# Patient Record
Sex: Male | Born: 1965 | Race: White | Hispanic: No | Smoking: Former smoker
Health system: Southern US, Community
[De-identification: ages and names within clinical notes are randomized; demographics above are authoritative.]

## PROBLEM LIST (undated history)

## (undated) DIAGNOSIS — R519 Headache, unspecified: Secondary | ICD-10-CM

## (undated) DIAGNOSIS — M1990 Unspecified osteoarthritis, unspecified site: Secondary | ICD-10-CM

## (undated) DIAGNOSIS — R51 Headache: Secondary | ICD-10-CM

## (undated) DIAGNOSIS — K589 Irritable bowel syndrome without diarrhea: Secondary | ICD-10-CM

## (undated) DIAGNOSIS — K219 Gastro-esophageal reflux disease without esophagitis: Secondary | ICD-10-CM

## (undated) HISTORY — PX: NO PAST SURGERIES: SHX2092

---

## 2000-11-14 ENCOUNTER — Encounter: Payer: Self-pay | Admitting: Emergency Medicine

## 2000-11-14 ENCOUNTER — Emergency Department (HOSPITAL_COMMUNITY): Admission: EM | Admit: 2000-11-14 | Discharge: 2000-11-14 | Payer: Self-pay | Admitting: Emergency Medicine

## 2001-03-24 ENCOUNTER — Encounter: Payer: Self-pay | Admitting: Emergency Medicine

## 2001-03-24 ENCOUNTER — Emergency Department (HOSPITAL_COMMUNITY): Admission: EM | Admit: 2001-03-24 | Discharge: 2001-03-24 | Payer: Self-pay

## 2002-12-04 ENCOUNTER — Emergency Department (HOSPITAL_COMMUNITY): Admission: EM | Admit: 2002-12-04 | Discharge: 2002-12-04 | Payer: Self-pay | Admitting: Emergency Medicine

## 2003-05-23 ENCOUNTER — Emergency Department (HOSPITAL_COMMUNITY): Admission: EM | Admit: 2003-05-23 | Discharge: 2003-05-23 | Payer: Self-pay | Admitting: Emergency Medicine

## 2004-06-17 ENCOUNTER — Inpatient Hospital Stay (HOSPITAL_COMMUNITY): Admission: EM | Admit: 2004-06-17 | Discharge: 2004-06-20 | Payer: Self-pay | Admitting: Emergency Medicine

## 2004-06-19 ENCOUNTER — Ambulatory Visit: Payer: Self-pay | Admitting: Orthopedic Surgery

## 2004-06-21 ENCOUNTER — Encounter (HOSPITAL_COMMUNITY): Admission: RE | Admit: 2004-06-21 | Discharge: 2004-07-21 | Payer: Self-pay | Admitting: Oncology

## 2004-06-22 ENCOUNTER — Ambulatory Visit (HOSPITAL_COMMUNITY): Payer: Self-pay | Admitting: Internal Medicine

## 2004-06-27 ENCOUNTER — Ambulatory Visit (HOSPITAL_COMMUNITY): Payer: Self-pay | Admitting: Internal Medicine

## 2004-07-01 ENCOUNTER — Ambulatory Visit (HOSPITAL_COMMUNITY): Payer: Self-pay | Admitting: Internal Medicine

## 2007-08-18 ENCOUNTER — Encounter: Admission: RE | Admit: 2007-08-18 | Discharge: 2007-08-18 | Payer: Self-pay | Admitting: Family Medicine

## 2010-06-09 NOTE — Group Therapy Note (Signed)
Reginald [name], Reginald [name]                  ACCOUNT NO.:  [id number]   MEDICAL RECORD NO.:  [id number]          PATIENT TYPE:  INP   LOCATION:  A338                          FACILITY:  [facility]   PHYSICIAN:  Vickki Hearing, M.D.DATE OF BIRTH:  [birth year]   DATE OF PROCEDURE:  06/19/2004  DATE OF DISCHARGE:                                   PROGRESS NOTE   DIAGNOSIS:  Carbuncles of right forearm.   The patient remained afebrile.  White count is down from 10 to 9.  There is  no left shift.  The arm continues to improve.  Continue antibiotics.      SEH/MEDQ  D:  06/19/2004  T:  06/19/2004  Job:  [id number]

## 2010-06-09 NOTE — Discharge Summary (Signed)
NAMECARLYN, Reginald [name]                  ACCOUNT NO.:  [id number]   MEDICAL RECORD NO.:  [id number]          PATIENT TYPE:  INP   LOCATION:  A338                          FACILITY:  [facility]   PHYSICIAN:  Vania Rea, M.D. DATE OF BIRTH:  [birth year]   DATE OF ADMISSION:  06/17/2004  DATE OF DISCHARGE:  05/30/2006LH                                 DISCHARGE SUMMARY   PRIMARY CARE PHYSICIAN:  Dr. Christell Constant at [facility].   CONSULTS THIS ADMISSION:  Dr. Fuller Canada.   DISCHARGE DIAGNOSIS:  Multiple abscesses right forearm probable methicillin-  resistant Staphylococcus aureus.   DISPOSITION:  Discharged to home.   DISCHARGE CONDITION:  Stable.   DISCHARGE MEDICATIONS:  1. Vancomycin 2000 mg IV daily for two weeks.  2. Vicodin one-two tablets when necessary for pain, 10 tablets prescribed.  3. Bactroban ointment apply twice daily to sores.     HOSPITAL COURSE:  Please refer to the admission history and physical.  This  is a 45 year old Caucasian man who works in a [employer] and  who injured his right arm playing softball.  Subsequently, he developed  multiple inflammatory swellings of the right forearm and extensive swelling  of the elbow with multiple abscesses.  The patient had failed to respond to  Bactrim and doxycycline as an outpatient and was noted to be allergic to  penicillin.  He was started on intravenous vancomycin in hospital and had  the benefit of an orthopedic consult.  The swelling of his elbow has  decreased considerably.  There was no evidence of arthritic involvement and  the ulcers are much improved.  He had a white count of 15,000 on the day of  admission.  It is now considered resolved.  The patient is feeling  comfortable and he is considered fit enough to be continued on antibiotics  as an outpatient.  Cultures from one of the abscesses grew nil.   PHYSICAL EXAMINATION:  VITAL SIGNS:  This morning temperature is 98,  pulse  76, respirations 20, blood pressure 123/74.  CHEST:  Clear to auscultation.  CARDIOVASCULAR:  Regular rhythm without murmurs.  ABDOMEN:  Soft and nontender.  EXTREMITIES:  His right forearm has three indurated areas with superficial  eschars.  All areas are inflamed but no evidence of abscess formation  currently.   LABORATORIES:  His white count has decreased to 8.9, his hemoglobin is  stable at 13.9, hematocrit 40, RDW 12.8, and platelets 219.  Absolute  neutrophil count is 5.1.  His sodium is 141, potassium 4.0, chloride 107,  CO2 of 27, glucose 102, BUN 7, creatinine 0.9.  His liver function tests on  admission were remarkable only for a slightly elevated AST of 42.  His  calcium is 8.6.  His urinalysis was unremarkable.  X-rays of the forearm  revealed no evidence of bone involvement.  Blood cultures and wound cultures  showed no evidence of infection.   FOLLOW UP:  With [facility] within one week.  He is to come to the  specialty clinic daily for infusion of vancomycin.  He is to be reevaluated  for return to work in two weeks.      LC/MEDQ  D:  06/20/2004  T:  06/20/2004  Job:  [id number]

## 2010-06-09 NOTE — Consult Note (Signed)
NAMEEVANGELOS, PAULINO                  ACCOUNT NO.:  [id number]   MEDICAL RECORD NO.:  [id number]          PATIENT TYPE:  INP   LOCATION:  A338                          FACILITY:  [facility]   PHYSICIAN:  Vickki Hearing, M.D.DATE OF BIRTH:  07-14-1965   DATE OF CONSULTATION:  06/18/2004  DATE OF DISCHARGE:                                   CONSULTATION   Initial consultation request was for Dr. Hilda Lias.  He is out of town, and I  am covering for him.   REQUESTING PHYSICIAN:  Calvert Cantor, M.D.   CHIEF COMPLAINT:  Abscess right forearm.   HISTORY:  A complete history and physical has been dictated by Dr. Butler Denmark.  It is incorporated by reference. I will do a summation.   Basically this is a 45 year old male who was playing softball last Monday.  Had some type of mild injury to the forearm, woke up the next morning with 3  areas of swollen, reddened, carbuncle-like lesions on his right forearm.  He  does not remember any insect bit.  He went to the [facility] and they started him on p.o. antibiotics.  He did not  improve.  By Saturday he was nauseous, he was vomiting, he had weakness.  His one area that had particularly swollen significantly.  This are was on  the volar aspect of the forearm, and he was also having some elbow swelling  with pain on flexion-extension.  There was also an axillary palpable lymph  node.   Dr. Butler Denmark admitted the patient and started him on vancomycin and moist heat  and when I saw him this morning his swelling had gone done in the elbow.  The primary large lesion had also decreased in size.  He responded well to  the vancomycin.   PHYSICAL EXAMINATION:  He was awake, alert, and oriented x3.  His mood and  affect were normal.  There were no sensory deficits in the right upper  extremity.  His other extremities were well __________ with no contractures,  subluxation, atrophy or tremor.   He had excellent elbow motion.  No  palpable lymph nodes.  The primary large  area was indurated, there was no expressible fluid.   The other areas has redness around them.  They were tender, as well, and  there was some purulent material, but a small amount leaking from a scabbed  over area.  There were no alignment problems.  Range of motion of the wrists  and shoulder were normal.  Muscle strength and muscle tone were normal.  Alignment, again was normal and all joints were stable.   His lab results showed a white count of 9.4.  Other history, again, taken  from the medical record incorporated by reference.   IMPRESSION:  Carbuncle/abscess right forearm, improving on vancomycin an  heating pad.  The cause of the lesions are unknown.   RECOMMEND:  Continue heating pad and vancomycin.  If the area becomes more  fluctuant a local I&D can be done to speed the healing process.  If not, it  will resolve eventually without surgical treatment.      SEH/MEDQ  D:  06/18/2004  T:  06/18/2004  Job:  [id number]

## 2010-06-09 NOTE — H&P (Signed)
Reginald [name], Reginald [name]                  ACCOUNT NO.:  [id number]   MEDICAL RECORD NO.:  [id number]          PATIENT TYPE:  EMS   LOCATION:  ED                            FACILITY:  [facility]   PHYSICIAN:  Calvert Cantor, M.D.     DATE OF BIRTH:  1965-01-31   DATE OF ADMISSION:  06/17/2004  DATE OF DISCHARGE:  LH                                HISTORY & PHYSICAL   Patient of [facility].   PRESENTING COMPLAINT:  Abscesses on the right arm.   HISTORY OF PRESENT ILLNESS:  This is a 45 year old white male who injured  his arm while playing softball.  On the following day, he noticed swelling  of his right arm, which further developed into small bumps and later  carbuncles.  He has been followed up by [facility]  and was started on Bactrim and doxycycline; however, the swelling has  continued to worsen.  Therefore, he was sent to the ER today.  The patient  does not complain of any fevers or chills; however, en route to the hospital  today, he noticed some sweats.  He states that he is having pain in the  abscesses, in addition to pain in his right elbow, which has swollen up as  well.  There has been some drainage out of the largest abscess; however, it  is not draining currently.  He has not noticed any rash in any other part of  the body nor any other joint pain.   PAST MEDICAL HISTORY:  None.   PAST SURGICAL HISTORY:  None.   ALLERGIES:  He is allergic to PENICILLIN, which causes nausea, vomiting, and  a rash on his hands.   SOCIAL HISTORY:  He is a smoker, smoking 1-1/2 packs per day since he was 45  years old.  He does not drink alcohol.  He is unmarried.  He has two  children, who are ages 9 and 27, who are alive and healthy.   FAMILY HISTORY:  His father is alive with a history of CAD, status post  stent placement, and hypertension.  His mother is also alive with  hypertension.  He currently lives with his parents.   MEDICATIONS:   Bactrim DS 1 tab b.i.d.  Vermox 100 mg daily.  Patient was  initially given Keflex; however, this was ineffective, and Bactrim was  added.  Yesterday, he was given IV Rocephin in the office.  In addition, he  has been on Ultram and codeine for pain.   PHYSICAL EXAMINATION:  VITAL SIGNS:  Temperature 98.2 degrees, blood  pressure 127/75, pulse 94, respiratory rate 18, pulse ox 98% on room air.  MUSCULOSKELETAL:  Right arm is showing a large abscess, about 2 x 3 inches  on the forearm with a central necrotic area, which is scabbed.  There is no  drainage.  There is severe induration, erythema that extends about 2 inches  beyond the abscess, warmth and tenderness.  He has a smaller abscess below  this large on his forearm, and he has another one on the  back of his  forearm, all of which appeared to have drained, however are still enlarged  and tender with a central necrotic area.  His right elbow is swollen and  tender.  Painful when flexed and extended.  He has enlarged axillary lymph  nodes on the right side as well.  HEART:  Regular rate and rhythm.  LUNGS:  Clear bilaterally.  ABDOMEN:  Soft, nontender, nondistended.  Bowel sounds positive.  EXTREMITIES:  No clubbing, cyanosis or edema in the lower extremities.   REVIEW OF SYSTEMS:  Positive for nausea and vomiting.  Negative for fevers  or chills.  Positive for some sweats.  Negative for abdominal pain,  diarrhea, and dysuria.  Negative for shortness of breath and cough.   BLOOD WORK:  White count is 10.5, hemoglobin 14, hematocrit 40.4, platelets  203.  Sodium 135, potassium 3.9, chloride 104, bicarb 26, glucose 119.  BUN  4, creatinine 0.9.  Total bilirubin 0.4.  Alkaline phosphatase 80.  AST 42,  ALT 17, total protein 6.1, albumin 3.6, calcium 8.1.  UA is normal.   X-ray of the right forearm is normal.   ASSESSMENT/PLAN:  This is a 45 year old with multiple abscesses which have  been progressing, despite being on Ancef, Bactrim  DS, and receiving a dose  of Rocephin and Vermox.  The patient is going to be admitted and started on  vancomycin IV.  Blood cultures will be drawn.  Surgery consults will be  placed for incision and drainage and possible aspiration of the elbow.  Actually, the swelling around the elbow appears to be more in his muscles  rather than in the joint itself.  He will receive Tylenol for fever, Vicodin  for pain, Reglan for nausea and vomiting.       SR/MEDQ  D:  06/17/2004  T:  06/17/2004  Job:  [id number]   cc:   [facility]

## 2015-04-29 ENCOUNTER — Encounter: Payer: Self-pay | Admitting: Family

## 2015-04-29 ENCOUNTER — Emergency Department (HOSPITAL_COMMUNITY)
Admission: EM | Admit: 2015-04-29 | Discharge: 2015-04-29 | Disposition: A | Discharge status: Home / Self Care | Payer: 59 | Attending: Emergency Medicine | Admitting: Emergency Medicine

## 2015-04-29 ENCOUNTER — Ambulatory Visit (INDEPENDENT_AMBULATORY_CARE_PROVIDER_SITE_OTHER): Payer: 59 | Admitting: Family

## 2015-04-29 ENCOUNTER — Encounter (HOSPITAL_COMMUNITY): Payer: Self-pay | Admitting: Emergency Medicine

## 2015-04-29 ENCOUNTER — Encounter (INDEPENDENT_AMBULATORY_CARE_PROVIDER_SITE_OTHER): Payer: Self-pay

## 2015-04-29 VITALS — BP 147/90 | HR 74 | Temp 97.0°F | Ht 68.0 in | Wt 129.6 lb

## 2015-04-29 DIAGNOSIS — F17210 Nicotine dependence, cigarettes, uncomplicated: Secondary | ICD-10-CM | POA: Diagnosis not present

## 2015-04-29 DIAGNOSIS — K460 Unspecified abdominal hernia with obstruction, without gangrene: Secondary | ICD-10-CM | POA: Diagnosis not present

## 2015-04-29 DIAGNOSIS — K4090 Unilateral inguinal hernia, without obstruction or gangrene, not specified as recurrent: Secondary | ICD-10-CM

## 2015-04-29 MED ORDER — HYDROCODONE-ACETAMINOPHEN 5-325 MG PO TABS: 1.0000 | ORAL_TABLET | Freq: Four times a day (QID) | ORAL | Status: DC | PRN

## 2015-04-29 MED ORDER — ONDANSETRON HCL 4 MG/2ML IJ SOLN
4.0000 mg | Freq: Once | INTRAMUSCULAR | Status: AC
  Administered 2015-04-29: 4 mg via INTRAVENOUS
  Filled 2015-04-29: qty 2

## 2015-04-29 MED ORDER — HYDROMORPHONE HCL 1 MG/ML IJ SOLN
0.5000 mg | Freq: Once | INTRAMUSCULAR | Status: AC
  Administered 2015-04-29: 0.5 mg via INTRAVENOUS
  Filled 2015-04-29: qty 1

## 2015-04-29 MED ORDER — ONDANSETRON 4 MG PO TBDP: ORAL_TABLET | ORAL | Status: DC

## 2015-04-29 NOTE — ED Provider Notes (Signed)
CSN: [id number]     Arrival date & time 04/29/15  1532 History   First MD Initiated Contact with Patient 04/29/15 1600     Chief Complaint  Patient presents with  . Inguinal Hernia     (Consider location/radiation/quality/duration/timing/severity/associated sxs/prior Treatment) Patient is a 50 y.o. male presenting with abdominal pain. The history is provided by the patient (Patient complains of right inguinal abdominal pain).  Abdominal Pain Pain location: Right inguinal area. Pain quality: aching   Pain radiates to:  Does not radiate Pain severity:  Moderate Onset quality:  Sudden Timing:  Intermittent Progression:  Waxing and waning Chronicity:  New Context: not alcohol use   Associated symptoms: no chest pain, no cough, no diarrhea, no fatigue and no hematuria     History reviewed. No pertinent past medical history. History reviewed. No pertinent past surgical history. Family History  Problem Relation Age of Onset  . Diabetes Mother   . Osteoarthritis Mother   . Heart disease Father    Social History  Substance Use Topics  . Smoking status: Heavy Tobacco Smoker -- 1.50 packs/day    Types: Cigarettes  . Smokeless tobacco: None  . Alcohol Use: No    Review of Systems  Constitutional: Negative for appetite change and fatigue.  HENT: Negative for congestion, ear discharge and sinus pressure.   Eyes: Negative for discharge.  Respiratory: Negative for cough.   Cardiovascular: Negative for chest pain.  Gastrointestinal: Positive for abdominal pain. Negative for diarrhea.  Genitourinary: Negative for frequency and hematuria.  Musculoskeletal: Negative for back pain.  Skin: Negative for rash.  Neurological: Negative for seizures and headaches.  Psychiatric/Behavioral: Negative for hallucinations.      Allergies  Penicillins  Home Medications   Prior to Admission medications   Medication Sig Start Date End Date Taking? Authorizing Provider   HYDROcodone-acetaminophen (NORCO/VICODIN) 5-325 MG tablet Take 1 tablet by mouth every 6 (six) hours as needed. 04/29/15   Milton Ferguson, MD  ondansetron (ZOFRAN ODT) 4 MG disintegrating tablet 4mg  ODT q4 hours prn nausea/vomit 04/29/15   Milton Ferguson, MD   BP 151/84 mmHg  Pulse 82  Temp(Src) 98.2 F (36.8 C) (Oral)  Resp 17  Ht 5\' 8"  (1.727 m)  Wt 146 lb (66.225 kg)  BMI 22.20 kg/m2  SpO2 99% Physical Exam  Constitutional: He is oriented to person, place, and time. He appears well-developed.  HENT:  Head: Normocephalic.  Eyes: Conjunctivae and EOM are normal. No scleral icterus.  Neck: Neck supple. No thyromegaly present.  Cardiovascular: Normal rate and regular rhythm.  Exam reveals no gallop and no friction rub.   No murmur heard. Pulmonary/Chest: No stridor. He has no wheezes. He has no rales. He exhibits no tenderness.  Abdominal: He exhibits no distension. There is tenderness. There is no rebound.  Tenderness and swelling to right inguinal area. Patient has a right inguinal hernia that is reducible  Musculoskeletal: Normal range of motion. He exhibits no edema.  Lymphadenopathy:    He has no cervical adenopathy.  Neurological: He is oriented to person, place, and time. He exhibits normal muscle tone. Coordination normal.  Skin: No rash noted. No erythema.  Psychiatric: He has a normal mood and affect. His behavior is normal.    ED Course  Procedures (including critical care time) Labs Review Labs Reviewed - No data to display  Imaging Review No results found. I have personally reviewed and evaluated these images and lab results as part of my medical decision-making.   EKG  Interpretation None      MDM   Final diagnoses:  Hernia with obstruction    Patient with a reducible right inguinal hernia. Patient given Vicodin and Zofran. He is referred to general surgery. Patient given a note for work not to do any lifting or operating machinery    Milton Ferguson,  MD 04/29/15 [phone]

## 2015-04-29 NOTE — Progress Notes (Signed)
   Subjective:    Patient ID: Reginald [name], male    DOB: 23-May-1965, 50 y.o.   MRN: [id number]  HPI PT presents to the office today to establish care. Pt states he has a "knot" in her right groin that he noticed it two weeks ago. Pt states he was driving and started coughing and noticed the painful knot. Pt states he "push the knot in ", but if he bends or moves it comes back out. Pt states he is having constant 10 out of 10. Pt states it hurts to walk. Pt state she has taken aspirin with no relief.    Review of Systems  Constitutional: Negative.   HENT: Negative.   Respiratory: Negative.   Cardiovascular: Negative.   Gastrointestinal: Negative.   Endocrine: Negative.   Genitourinary: Negative.   Musculoskeletal: Negative.   Neurological: Negative.   Hematological: Negative.   Psychiatric/Behavioral: Negative.   All other systems reviewed and are negative.      Objective:   Physical Exam  Constitutional: He is oriented to person, place, and time. He appears well-developed and well-nourished. No distress.  HENT:  Head: Normocephalic.  Eyes: Pupils are equal, round, and reactive to light. Right eye exhibits no discharge. Left eye exhibits no discharge.  Neck: Normal range of motion. Neck supple. No thyromegaly present.  Cardiovascular: Normal rate, regular rhythm, normal heart sounds and intact distal pulses.   No murmur heard. Pulmonary/Chest: Effort normal and breath sounds normal. No respiratory distress. He has no wheezes.  Abdominal: Soft. Bowel sounds are normal. He exhibits no distension. There is tenderness.  inguinal hernia present, soft to touch, but unable to push without causing a great deal of pain to patient.   Musculoskeletal: Normal range of motion. He exhibits no edema or tenderness.  Neurological: He is alert and oriented to person, place, and time. No cranial nerve deficit.  Skin: Skin is warm and dry. No rash noted. No erythema.  Psychiatric: He has a normal  mood and affect. His behavior is normal. Judgment and thought content normal.  Vitals reviewed.    BP 147/90 mmHg  Pulse 74  Temp(Src) 97 F (36.1 C) (Oral)  Ht 5\' 8"  (1.727 m)  Wt 129 lb 9.6 oz (58.786 kg)  BMI 19.71 kg/m2      Assessment & Plan:  1. Unilateral inguinal hernia without obstruction or gangrene, recurrence not specified _Pt told to go to ED because of the pain -Avoid heavy lifting or coughing -RTO prn   Evelina Dun, FNP

## 2015-04-29 NOTE — Discharge Instructions (Signed)
Follow-up with [facility] surgery next week

## 2015-04-29 NOTE — ED Notes (Signed)
PT sent from [facility] for dx of inguinal hernia evaluation. PT also stated he noticed blood in his urine first thing this morning but none noted this evening. PT also c/o nausea but no diarrhea or vomiting with normal BM this am.

## 2015-04-29 NOTE — Patient Instructions (Signed)
Hernia, Adult A hernia is the bulging of an organ or tissue through a weak spot in the muscles of the abdomen (abdominal wall). Hernias develop most often near the navel or groin. There are many kinds of hernias. Common kinds include:  Femoral hernia. This kind of hernia develops under the groin in the upper thigh area.  Inguinal hernia. This kind of hernia develops in the groin or scrotum.  Umbilical hernia. This kind of hernia develops near the navel.  Hiatal hernia. This kind of hernia causes part of the stomach to be pushed up into the chest.  Incisional hernia. This kind of hernia bulges through a scar from an abdominal surgery. CAUSES This condition may be caused by:  Heavy lifting.  Coughing over a long period of time.  Straining to have a bowel movement.  An incision made during an abdominal surgery.  A birth defect (congenital defect).  Excess weight or obesity.  Smoking.  Poor nutrition.  Cystic fibrosis.  Excess fluid in the abdomen.  Undescended testicles. SYMPTOMS Symptoms of a hernia include:  A lump on the abdomen. This is the first sign of a hernia. The lump may become more obvious with standing, straining, or coughing. It may get bigger over time if it is not treated or if the condition causing it is not treated.  Pain. A hernia is usually painless, but it may become painful over time if treatment is delayed. The pain is usually dull and may get worse with standing or lifting heavy objects. Sometimes a hernia gets tightly squeezed in the weak spot (strangulated) or stuck there (incarcerated) and causes additional symptoms. These symptoms may include:  Vomiting.  Nausea.  Constipation.  Irritability. DIAGNOSIS A hernia may be diagnosed with:  A physical exam. During the exam your health care provider may ask you to cough or to make a specific movement, because a hernia is usually more visible when you move.  Imaging tests. These can  include:  X-rays.  Ultrasound.  CT scan. TREATMENT A hernia that is small and painless may not need to be treated. A hernia that is large or painful may be treated with surgery. Inguinal hernias may be treated with surgery to prevent incarceration or strangulation. Strangulated hernias are always treated with surgery, because lack of blood to the trapped organ or tissue can cause it to die. Surgery to treat a hernia involves pushing the bulge back into place and repairing the weak part of the abdomen. HOME CARE INSTRUCTIONS  Avoid straining.  Do not lift anything heavier than 10 lb (4.5 kg).  Lift with your leg muscles, not your back muscles. This helps avoid strain.  When coughing, try to cough gently.  Prevent constipation. Constipation leads to straining with bowel movements, which can make a hernia worse or cause a hernia repair to break down. You can prevent constipation by:  Eating a high-fiber diet that includes plenty of fruits and vegetables.  Drinking enough fluids to keep your urine clear or pale yellow. Aim to drink 6-8 glasses of water per day.  Using a stool softener as directed by your health care provider.  Lose weight, if you are overweight.  Do not use any tobacco products, including cigarettes, chewing tobacco, or electronic cigarettes. If you need help quitting, ask your health care provider.  Keep all follow-up visits as directed by your health care provider. This is important. Your health care provider may need to monitor your condition. SEEK MEDICAL CARE IF:  You have   swelling, redness, and pain in the affected area.  Your bowel habits change. SEEK IMMEDIATE MEDICAL CARE IF:  You have a fever.  You have abdominal pain that is getting worse.  You feel nauseous or you vomit.  You cannot push the hernia back in place by gently pressing on it while you are lying down.  The hernia:  Changes in shape or size.  Is stuck outside the  abdomen.  Becomes discolored.  Feels hard or tender.   This information is not intended to replace advice given to you by your health care provider. Make sure you discuss any questions you have with your health care provider.   Document Released: 01/08/2005 Document Revised: 01/29/2014 Document Reviewed: 11/18/2013 Elsevier Interactive Patient Education 2016 [employer]  

## 2015-05-16 ENCOUNTER — Ambulatory Visit: Payer: Self-pay | Admitting: Surgery

## 2015-05-16 NOTE — H&P (Signed)
History of Present Illness Reginald [name]. Reginald Dudgeon MD; 05/16/2015 10:04 AM) The patient is a 50 year old male who presents with an inguinal hernia. PCP - [facility] - Dr. Laurance Flatten  Reason: right inguinal hernia  This is a 50 year old male with a past medical history significant only for heavy tobacco abuse who presents with a three-week history of right groin swelling and pain. The patient had a severe coughing episode and felt a burning tearing sensation in his right groin. Subsequently he developed a bulge in this area. It remains reducible. He was actually evaluated in the emergency department but was found to have a reducible hernia. He is now referred to discuss surgical repair.  The patient smokes 1.5 packs per day which is down from 3 packs per day. He does have a chronic cough.   Other Problems Elbert Ewings, CMA; 05/16/2015 9:00 AM) Arthritis Back Pain Inguinal Hernia Migraine Headache  Past Surgical History Elbert Ewings, CMA; 05/16/2015 9:00 AM) No pertinent past surgical history  Diagnostic Studies History Elbert Ewings, CMA; 05/16/2015 9:00 AM) Colonoscopy never  Allergies Elbert Ewings, CMA; 05/16/2015 9:01 AM) Penicillin V Potassium *PENICILLINS* Rash.  Medication History Elbert Ewings, CMA; 05/16/2015 9:01 AM) Hydrocodone-Acetaminophen (5-325MG  Tablet, Oral) Active. Zofran (4MG  Tablet, Oral) Active. Medications Reconciled  Social History Elbert Ewings, CMA; 05/16/2015 9:00 AM) Alcohol use Remotely quit alcohol use. Caffeine use Carbonated beverages, Coffee. Tobacco use Current every day smoker.  Family History Elbert Ewings, [place]; 05/16/2015 9:00 AM) Migraine Headache Father. Respiratory Condition Father.     Review of Systems Elbert Ewings CMA; 05/16/2015 9:00 AM) General Present- Appetite Loss and Night Sweats. Not Present- Chills, Fatigue, Fever, Weight Gain and Weight Loss. Skin Not Present- Change in Wart/Mole, Dryness, Hives,  Jaundice, New Lesions, Non-Healing Wounds, Rash and Ulcer. HEENT Present- Ringing in the Ears. Not Present- Earache, Hearing Loss, Hoarseness, Nose Bleed, Oral Ulcers, Seasonal Allergies, Sinus Pain, Sore Throat, Visual Disturbances, Wears glasses/contact lenses and Yellow Eyes. Respiratory Present- Chronic Cough. Not Present- Bloody sputum, Difficulty Breathing, Snoring and Wheezing. Breast Not Present- Breast Mass, Breast Pain, Nipple Discharge and Skin Changes. Cardiovascular Not Present- Chest Pain, Difficulty Breathing Lying Down, Leg Cramps, Palpitations, Rapid Heart Rate, Shortness of Breath and Swelling of Extremities. Gastrointestinal Present- Abdominal Pain, Bloating, Change in Bowel Habits, Excessive gas, Gets full quickly at meals, Nausea and Vomiting. Not Present- Bloody Stool, Chronic diarrhea, Constipation, Difficulty Swallowing, Hemorrhoids, Indigestion and Rectal Pain. Male Genitourinary Present- Blood in Urine, Change in Urinary Stream, Frequency, Nocturia and Urgency. Not Present- Impotence, Painful Urination and Urine Leakage.  Vitals Elbert Ewings CMA; 05/16/2015 9:01 AM) 05/16/2015 9:01 AM Weight: 146 lb Height: 68in Body Surface Area: 1.79 m Body Mass Index: 22.2 kg/m  Temp.: 97.61F  Pulse: 80 (Regular)  BP: 132/84 (Sitting, Left Arm, Standard)      Physical Exam Rodman Key K. Quron Ruddy MD; 05/16/2015 10:06 AM)  The physical exam findings are as follows: Note:WDWN in NAD - smells heavily of tobacco smoke HEENT: EOMI, sclera anicteric Neck: No masses, no thyromegaly Lungs: bilateral rhonchi; normal respiratory effort CV: Regular rate and rhythm; no murmurs Abd: +bowel sounds, soft, non-tender, no masses GU: bilateral descended testes; no testicular masses; visible reducible right inguinal hernia; no sign of left inguinal hernia Ext: Well-perfused; no edema Skin: Warm, dry; no sign of jaundice    Assessment & Plan Rodman Key K. Arlie Posch MD; 05/16/2015 9:32  AM)  RIGHT INGUINAL HERNIA (K40.90)  Current Plans Schedule for Surgery - Right inguinal hernia repair with  mesh. The surgical procedure has been discussed with the patient. Potential risks, benefits, alternative treatments, and expected outcomes have been explained. All of the patient's questions at this time have been answered. The likelihood of reaching the patient's treatment goal is good. The patient understand the proposed surgical procedure and wishes to proceed.   Reginald [name]. Georgette Dover, MD, [facility]  General/ Trauma Surgery  05/16/2015 10:06 AM

## 2015-05-31 ENCOUNTER — Ambulatory Visit: Payer: Self-pay | Admitting: General Surgery

## 2015-06-01 ENCOUNTER — Encounter (HOSPITAL_COMMUNITY): Payer: Self-pay | Admitting: *Deleted

## 2015-06-01 NOTE — Op Note (Signed)
Pt denies cardiac history, chest pain or sob. 

## 2015-06-02 ENCOUNTER — Encounter (HOSPITAL_COMMUNITY): Payer: Self-pay | Admitting: Anesthesiology

## 2015-06-02 ENCOUNTER — Encounter (HOSPITAL_COMMUNITY)
Admission: RE | Disposition: A | Discharge status: Home / Self Care | Payer: Self-pay | Source: Ambulatory Visit | Attending: General Surgery

## 2015-06-02 ENCOUNTER — Ambulatory Visit (HOSPITAL_COMMUNITY): Payer: 59 | Admitting: Anesthesiology

## 2015-06-02 ENCOUNTER — Ambulatory Visit (HOSPITAL_COMMUNITY)
Admission: RE | Admit: 2015-06-02 | Discharge: 2015-06-02 | Disposition: A | Discharge status: Home / Self Care | Payer: 59 | Source: Ambulatory Visit | Attending: General Surgery | Admitting: General Surgery

## 2015-06-02 DIAGNOSIS — M1990 Unspecified osteoarthritis, unspecified site: Secondary | ICD-10-CM | POA: Insufficient documentation

## 2015-06-02 DIAGNOSIS — F17210 Nicotine dependence, cigarettes, uncomplicated: Secondary | ICD-10-CM | POA: Insufficient documentation

## 2015-06-02 DIAGNOSIS — K4090 Unilateral inguinal hernia, without obstruction or gangrene, not specified as recurrent: Secondary | ICD-10-CM | POA: Diagnosis not present

## 2015-06-02 HISTORY — DX: Headache: R51

## 2015-06-02 HISTORY — PX: INSERTION OF MESH: SHX5868

## 2015-06-02 HISTORY — DX: Headache, unspecified: R51.9

## 2015-06-02 HISTORY — DX: Unspecified osteoarthritis, unspecified site: M19.90

## 2015-06-02 HISTORY — PX: INGUINAL HERNIA REPAIR: SHX194

## 2015-06-02 LAB — CBC
HEMATOCRIT: 47.2 % (ref 39.0–52.0)
Hemoglobin: 15.8 g/dL (ref 13.0–17.0)
MCH: 31.1 pg (ref 26.0–34.0)
MCHC: 33.5 g/dL (ref 30.0–36.0)
MCV: 92.9 fL (ref 78.0–100.0)
Platelets: 230 10*3/uL (ref 150–400)
RBC: 5.08 MIL/uL (ref 4.22–5.81)
RDW: 12.8 % (ref 11.5–15.5)
WBC: 10.8 10*3/uL — ABNORMAL HIGH (ref 4.0–10.5)

## 2015-06-02 SURGERY — REPAIR, HERNIA, INGUINAL, ADULT
Anesthesia: Regional | Site: Inguinal | Laterality: Right

## 2015-06-02 MED ORDER — MORPHINE SULFATE (PF) 2 MG/ML IV SOLN: 1.0000 mg | INTRAVENOUS | Status: DC | PRN

## 2015-06-02 MED ORDER — FENTANYL CITRATE (PF) 250 MCG/5ML IJ SOLN
INTRAMUSCULAR | Status: AC
  Filled 2015-06-02: qty 5

## 2015-06-02 MED ORDER — MIDAZOLAM HCL 2 MG/2ML IJ SOLN
INTRAMUSCULAR | Status: AC
  Filled 2015-06-02: qty 2

## 2015-06-02 MED ORDER — ONDANSETRON HCL 4 MG/2ML IJ SOLN
INTRAMUSCULAR | Status: DC | PRN
  Administered 2015-06-02: 4 mg via INTRAVENOUS

## 2015-06-02 MED ORDER — GLYCOPYRROLATE 0.2 MG/ML IJ SOLN
INTRAMUSCULAR | Status: DC | PRN
  Administered 2015-06-02: 0.4 mg via INTRAVENOUS

## 2015-06-02 MED ORDER — OXYCODONE HCL 5 MG PO TABS: 5.0000 mg | ORAL_TABLET | ORAL | Status: DC | PRN

## 2015-06-02 MED ORDER — CHLORHEXIDINE GLUCONATE 4 % EX LIQD: 1.0000 "application " | Freq: Once | CUTANEOUS | Status: DC

## 2015-06-02 MED ORDER — SODIUM CHLORIDE 0.9 % IV SOLN: INTRAVENOUS | Status: DC

## 2015-06-02 MED ORDER — CHLORHEXIDINE GLUCONATE 4 % EX LIQD: 1.0000 | Freq: Once | CUTANEOUS | Status: DC

## 2015-06-02 MED ORDER — 0.9 % SODIUM CHLORIDE (POUR BTL) OPTIME
TOPICAL | Status: DC | PRN
  Administered 2015-06-02: 1000 mL

## 2015-06-02 MED ORDER — CEFAZOLIN SODIUM-DEXTROSE 2-4 GM/100ML-% IV SOLN
INTRAVENOUS | Status: AC
  Filled 2015-06-02: qty 100

## 2015-06-02 MED ORDER — SODIUM CHLORIDE 0.9 % IV SOLN: 250.0000 mL | INTRAVENOUS | Status: DC | PRN

## 2015-06-02 MED ORDER — BUPIVACAINE-EPINEPHRINE (PF) 0.25% -1:200000 IJ SOLN
INTRAMUSCULAR | Status: AC
  Filled 2015-06-02: qty 30

## 2015-06-02 MED ORDER — NEOSTIGMINE METHYLSULFATE 10 MG/10ML IV SOLN
INTRAVENOUS | Status: DC | PRN
  Administered 2015-06-02: 3 mg via INTRAVENOUS

## 2015-06-02 MED ORDER — BUPIVACAINE-EPINEPHRINE (PF) 0.5% -1:200000 IJ SOLN
INTRAMUSCULAR | Status: DC | PRN
  Administered 2015-06-02: 30 mL via PERINEURAL

## 2015-06-02 MED ORDER — BUPIVACAINE HCL (PF) 0.25 % IJ SOLN
INTRAMUSCULAR | Status: AC
  Filled 2015-06-02: qty 10

## 2015-06-02 MED ORDER — OXYCODONE HCL 5 MG PO TABS
ORAL_TABLET | ORAL | Status: AC
  Administered 2015-06-02: 10 mg via ORAL
  Filled 2015-06-02: qty 2

## 2015-06-02 MED ORDER — CEFAZOLIN SODIUM-DEXTROSE 2-4 GM/100ML-% IV SOLN: 2.0000 g | INTRAVENOUS | Status: DC

## 2015-06-02 MED ORDER — SODIUM CHLORIDE 0.9% FLUSH: 3.0000 mL | INTRAVENOUS | Status: DC | PRN

## 2015-06-02 MED ORDER — LACTATED RINGERS IV SOLN
INTRAVENOUS | Status: DC
  Administered 2015-06-02 (×2): via INTRAVENOUS

## 2015-06-02 MED ORDER — FENTANYL CITRATE (PF) 100 MCG/2ML IJ SOLN
INTRAMUSCULAR | Status: DC | PRN
  Administered 2015-06-02: 100 ug via INTRAVENOUS
  Administered 2015-06-02: 50 ug via INTRAVENOUS

## 2015-06-02 MED ORDER — LIDOCAINE 2% (20 MG/ML) 5 ML SYRINGE
INTRAMUSCULAR | Status: AC
  Filled 2015-06-02: qty 5

## 2015-06-02 MED ORDER — SODIUM CHLORIDE 0.9% FLUSH: 3.0000 mL | Freq: Two times a day (BID) | INTRAVENOUS | Status: DC

## 2015-06-02 MED ORDER — ACETAMINOPHEN 650 MG RE SUPP: 650.0000 mg | RECTAL | Status: DC | PRN

## 2015-06-02 MED ORDER — OXYCODONE HCL 5 MG PO TABS
5.0000 mg | ORAL_TABLET | ORAL | Status: DC | PRN
  Administered 2015-06-02: 10 mg via ORAL

## 2015-06-02 MED ORDER — ONDANSETRON HCL 4 MG/2ML IJ SOLN: 4.0000 mg | Freq: Four times a day (QID) | INTRAMUSCULAR | Status: DC | PRN

## 2015-06-02 MED ORDER — MIDAZOLAM HCL 2 MG/2ML IJ SOLN
INTRAMUSCULAR | Status: AC
  Administered 2015-06-02: 2 mg
  Filled 2015-06-02: qty 2

## 2015-06-02 MED ORDER — OXYCODONE HCL 5 MG PO TABS: 5.0000 mg | ORAL_TABLET | Freq: Once | ORAL | Status: DC | PRN

## 2015-06-02 MED ORDER — HYDROMORPHONE HCL 1 MG/ML IJ SOLN: 0.2500 mg | INTRAMUSCULAR | Status: DC | PRN

## 2015-06-02 MED ORDER — FENTANYL CITRATE (PF) 100 MCG/2ML IJ SOLN
INTRAMUSCULAR | Status: AC
  Administered 2015-06-02: 100 ug
  Filled 2015-06-02: qty 2

## 2015-06-02 MED ORDER — PROPOFOL 10 MG/ML IV BOLUS
INTRAVENOUS | Status: DC | PRN
  Administered 2015-06-02: 200 mg via INTRAVENOUS

## 2015-06-02 MED ORDER — VANCOMYCIN HCL 10 G IV SOLR: 1500.0000 mg | INTRAVENOUS | Status: DC

## 2015-06-02 MED ORDER — LIDOCAINE HCL (CARDIAC) 20 MG/ML IV SOLN
INTRAVENOUS | Status: DC | PRN
  Administered 2015-06-02: 80 mg via INTRAVENOUS
  Administered 2015-06-02: 100 mg via INTRATRACHEAL

## 2015-06-02 MED ORDER — VANCOMYCIN HCL IN DEXTROSE 1-5 GM/200ML-% IV SOLN
1000.0000 mg | INTRAVENOUS | Status: AC
  Administered 2015-06-02: 1000 mg via INTRAVENOUS
  Filled 2015-06-02: qty 200

## 2015-06-02 MED ORDER — ACETAMINOPHEN 325 MG PO TABS: 650.0000 mg | ORAL_TABLET | ORAL | Status: DC | PRN

## 2015-06-02 MED ORDER — ROCURONIUM BROMIDE 100 MG/10ML IV SOLN
INTRAVENOUS | Status: DC | PRN
  Administered 2015-06-02: 40 mg via INTRAVENOUS

## 2015-06-02 MED ORDER — BUPIVACAINE-EPINEPHRINE 0.25% -1:200000 IJ SOLN
INTRAMUSCULAR | Status: DC | PRN
  Administered 2015-06-02: 7 mL

## 2015-06-02 MED ORDER — OXYCODONE HCL 5 MG/5ML PO SOLN: 5.0000 mg | Freq: Once | ORAL | Status: DC | PRN

## 2015-06-02 SURGICAL SUPPLY — 56 items
BENZOIN TINCTURE PRP APPL 2/3 (GAUZE/BANDAGES/DRESSINGS) ×3 IMPLANT
BLADE SURG 10 STRL SS (BLADE) ×3 IMPLANT
BLADE SURG 15 STRL LF DISP TIS (BLADE) ×1 IMPLANT
BLADE SURG 15 STRL SS (BLADE) ×2
BLADE SURG ROTATE 9660 (MISCELLANEOUS) IMPLANT
CANISTER SUCTION 2500CC (MISCELLANEOUS) IMPLANT
CHLORAPREP W/TINT 26ML (MISCELLANEOUS) ×3 IMPLANT
CLOSURE STERI-STRIP 1/4X4 (GAUZE/BANDAGES/DRESSINGS) ×3 IMPLANT
CLOSURE WOUND 1/2 X4 (GAUZE/BANDAGES/DRESSINGS) ×1
COVER SURGICAL LIGHT HANDLE (MISCELLANEOUS) ×3 IMPLANT
DRAIN PENROSE 1/2X12 LTX STRL (WOUND CARE) IMPLANT
DRAPE LAPAROTOMY T 98X78 PEDS (DRAPES) ×3 IMPLANT
DRAPE LAPAROTOMY TRNSV 102X78 (DRAPE) IMPLANT
DRAPE UTILITY XL STRL (DRAPES) ×6 IMPLANT
DRSG TEGADERM 4X4.75 (GAUZE/BANDAGES/DRESSINGS) ×3 IMPLANT
ELECT CAUTERY BLADE 6.4 (BLADE) ×3 IMPLANT
ELECT REM PT RETURN 9FT ADLT (ELECTROSURGICAL) ×3
ELECTRODE REM PT RTRN 9FT ADLT (ELECTROSURGICAL) ×1 IMPLANT
GAUZE SPONGE 4X4 12PLY STRL (GAUZE/BANDAGES/DRESSINGS) ×3 IMPLANT
GLOVE BIO SURGEON STRL SZ7.5 (GLOVE) ×3 IMPLANT
GLOVE BIOGEL M STRL SZ7.5 (GLOVE) ×3 IMPLANT
GLOVE BIOGEL PI IND STRL 7.0 (GLOVE) ×1 IMPLANT
GLOVE BIOGEL PI IND STRL 7.5 (GLOVE) ×1 IMPLANT
GLOVE BIOGEL PI IND STRL 8 (GLOVE) ×1 IMPLANT
GLOVE BIOGEL PI INDICATOR 7.0 (GLOVE) ×2
GLOVE BIOGEL PI INDICATOR 7.5 (GLOVE) ×2
GLOVE BIOGEL PI INDICATOR 8 (GLOVE) ×2
GLOVE SURG SS PI 7.0 STRL IVOR (GLOVE) ×3 IMPLANT
GOWN STRL REUS W/ TWL LRG LVL3 (GOWN DISPOSABLE) ×1 IMPLANT
GOWN STRL REUS W/ TWL XL LVL3 (GOWN DISPOSABLE) ×1 IMPLANT
GOWN STRL REUS W/TWL LRG LVL3 (GOWN DISPOSABLE) ×2
GOWN STRL REUS W/TWL XL LVL3 (GOWN DISPOSABLE) ×2
KIT BASIN OR (CUSTOM PROCEDURE TRAY) ×3 IMPLANT
KIT ROOM TURNOVER OR (KITS) ×3 IMPLANT
MESH ULTRAPRO 3X6 7.6X15CM (Mesh General) ×3 IMPLANT
NEEDLE HYPO 25GX1X1/2 BEV (NEEDLE) ×3 IMPLANT
NS IRRIG 1000ML POUR BTL (IV SOLUTION) ×3 IMPLANT
PACK SURGICAL SETUP 50X90 (CUSTOM PROCEDURE TRAY) ×3 IMPLANT
PAD ARMBOARD 7.5X6 YLW CONV (MISCELLANEOUS) ×3 IMPLANT
PENCIL BUTTON HOLSTER BLD 10FT (ELECTRODE) ×3 IMPLANT
SPECIMEN JAR SMALL (MISCELLANEOUS) IMPLANT
SPONGE INTESTINAL PEANUT (DISPOSABLE) IMPLANT
SPONGE LAP 18X18 X RAY DECT (DISPOSABLE) ×3 IMPLANT
STRIP CLOSURE SKIN 1/2X4 (GAUZE/BANDAGES/DRESSINGS) ×2 IMPLANT
SUT MNCRL AB 4-0 PS2 18 (SUTURE) ×3 IMPLANT
SUT PROLENE 2 0 CT2 30 (SUTURE) ×6 IMPLANT
SUT VIC AB 2-0 CT1 36 (SUTURE) ×3 IMPLANT
SUT VIC AB 3-0 SH 18 (SUTURE) ×3 IMPLANT
SUT VICRYL AB 3 0 TIES (SUTURE) ×3 IMPLANT
SYR BULB 3OZ (MISCELLANEOUS) ×3 IMPLANT
SYR CONTROL 10ML LL (SYRINGE) ×3 IMPLANT
TOWEL OR 17X24 6PK STRL BLUE (TOWEL DISPOSABLE) ×6 IMPLANT
TOWEL OR 17X26 10 PK STRL BLUE (TOWEL DISPOSABLE) ×3 IMPLANT
TUBE CONNECTING 12'X1/4 (SUCTIONS)
TUBE CONNECTING 12X1/4 (SUCTIONS) IMPLANT
YANKAUER SUCT BULB TIP NO VENT (SUCTIONS) IMPLANT

## 2015-06-02 NOTE — Discharge Instructions (Signed)
[facility]  UMBILICAL OR INGUINAL HERNIA REPAIR: POST OP INSTRUCTIONS  Always review your discharge instruction sheet given to you by the facility where your surgery was performed. IF YOU HAVE DISABILITY OR FAMILY LEAVE FORMS, YOU MUST BRING THEM TO THE OFFICE FOR PROCESSING.   DO NOT GIVE THEM TO YOUR DOCTOR.  1. A  prescription for pain medication may be given to you upon discharge.  Take your pain medication as prescribed, if needed.  you may take acetaminophen (Tylenol) &/or ibuprofen (Advil) as needed. 2. Take your usually prescribed medications unless otherwise directed. 3. If you need a refill on your pain medication, please contact your pharmacy.  They will contact our office to request authorization. Prescriptions will not be filled after 5 pm or on week-ends. 4. You should follow a light diet the first 24 hours after arrival home, such as soup and crackers, etc.  Be sure to include lots of fluids daily.  Resume your normal diet the day after surgery. 5. Most patients will experience some swelling and bruising around the umbilicus or in the groin and scrotum.  Ice packs and reclining will help.  Swelling and bruising can take several days to resolve.  6. It is common to experience some constipation if taking pain medication after surgery.  Increasing fluid intake and taking a stool softener (such as Colace) will usually help or prevent this problem from occurring.  A mild laxative (Milk of Magnesia or Miralax) should be taken according to package directions if there are no bowel movements after 48 hours. 7. Unless discharge instructions indicate otherwise, you may remove your bandages 48 hours after surgery, and you may shower at that time.  You  have steri-strips (small skin tapes) in place directly over the incision.  These strips should be left on the skin for 7-10 days.  . 8. ACTIVITIES:  You may resume regular (light) daily activities beginning the next day--such as  daily self-care, walking, climbing stairs--gradually increasing activities as tolerated.  You may have sexual intercourse when it is comfortable.  Refrain from any heavy lifting or straining until approved by your doctor. a. You may drive when you are no longer taking prescription pain medication, you can comfortably wear a seatbelt, and you can safely maneuver your car and apply brakes. b. RETURN TO WORK:  9. You should see your doctor in the office for a follow-up appointment approximately 2-3 weeks after your surgery.  Make sure that you call for this appointment within a day or two after you arrive home to insure a convenient appointment time. 10. OTHER INSTRUCTIONS: DO NOT LIFT, PUSH, OR PULL ANYTHING GREATER THAN 10 POUNDS FOR 6 WEEKS    WHEN TO CALL YOUR DOCTOR: 1. Fever over 101.0 2. Inability to urinate 3. Nausea and/or vomiting 4. Extreme swelling or bruising 5. Continued bleeding from incision. 6. Increased pain, redness, or drainage from the incision  The clinic staff is available to answer your questions during regular business hours.  Please dont hesitate to call and ask to speak to one of the nurses for clinical concerns.  If you have a medical emergency, go to the nearest emergency room or call 911.  A surgeon from [facility] is always on call at the hospital   [address] [place], [place], [place]  [address] ?  [address], [place], [place]   [id number] [phone] ? [phone] ? FAX [phone] Web site: [email or web address]

## 2015-06-02 NOTE — Interval H&P Note (Signed)
History and Physical Interval Note:  06/02/2015 2:37 PM  Reginald [name]  has presented today for surgery, with the diagnosis of Right inguinal hernia  The various methods of treatment have been discussed with the patient and family. After consideration of risks, benefits and other options for treatment, the patient has consented to  Procedure(s): RIGHT INGUINAL HERNIA REPAIR WITH MESH (Right) INSERTION OF MESH (Right) as a surgical intervention .  The patient's history has been reviewed, patient examined, no change in status, stable for surgery.  I have reviewed the patient's chart and labs.  Questions were answered to the patient's satisfaction.    Pt insisted his surgery be done this week due to work issues. But dr Georgette Dover [place] so the pt was offered surgery by me. He agreed to proceed with surgery with me performing it. Pt seen, interviewed, and examined in holding. History as per Dr Georgette Dover. Pt does have some burning,stinging currently in right groin. +tob. No n/v/d/c. Intermittent bulge  Alert, nontoxic cta mild wheeze Reg Soft, nt, nd, easily reducible right groin bulge   I described the procedure in detail.   We discussed the risks and benefits including but not limited to bleeding, infection, chronic inguinal pain, nerve entrapment, hernia recurrence, mesh complications, hematoma formation, urinary retention, injury to the testicles, numbness in the groin, blood clots, injury to the surrounding structures, and anesthesia risk. We also discussed the typical post operative recovery course, including no heavy lifting for 6 weeks. I explained that the likelihood of improvement of their symptoms is good. Explained that he was at higher risk for infection/recurrence due to smoking and chronic groin pain due to preop burning/stinging.  Discussed typical postop recovery  Leighton Ruff. Redmond Pulling, MD, [place], Bariatric, & Minimally Invasive Surgery [facility],  [place]     [facility]

## 2015-06-02 NOTE — Op Note (Signed)
ROLLINS PUSEY [id number] 1966-01-08 06/02/2015 Open Repair of Right Indirect Inguinal Hernia with Mesh Procedure Note  Indications: The patient presented with a history of a right, reducible hernia.    Pre-operative Diagnosis: right reducible inguinal hernia  Post-operative Diagnosis: right indirect inguinal hernia  Surgeon: Gayland Curry   Assistants: Sharyn Dross RNFA  Anesthesia: General endotracheal anesthesia + TAPP block  Surgeon: Leighton Ruff. Redmond Pulling, MD, FACS  Procedure Details  The patient was seen again in the Holding Room. The risks, benefits, complications, treatment options, and expected outcomes were discussed with the patient. The possibilities of reaction to medication, pulmonary aspiration, perforation of viscus, bleeding, recurrent infection, the need for additional procedures, and development of a complication requiring transfusion or further operation were discussed with the patient and/or family. The likelihood of success in repairing the hernia and returning the patient to their previous functional status is good.  There was concurrence with the proposed plan, and informed consent was obtained. The site of surgery was properly noted/marked. The patient was taken to the Operating Room, identified as Tivis L Detjen, and the procedure verified as right inguinal hernia repair. A Time Out was held and the above information confirmed.  The patient was placed in the supine position and underwent induction of anesthesia. The lower abdomen and groin was prepped with Chloraprep and draped in the standard fashion, and 0.25% Marcaine with epinephrine was used to anesthetize the skin over the mid-portion of the inguinal canal. An oblique incision was made. Dissection was carried down through the subcutaneous tissue with cautery to the external oblique fascia.  We opened the external oblique fascia along the direction of its fibers to the external ring.  The spermatic cord was circumferentially  dissected bluntly and retracted with a Penrose drain.  The floor of the inguinal canal was inspected & there was no defect.  We skeletonized the spermatic cord and isolated a hernia sac from the cord contents. He appeared to have slight prominence of testicular vessels perhaps c/w varicocele which was left alone.  We used a 3 x 6 inch piece of Ultrapro mesh, which was cut into a keyhole shape.  This was secured with 2-0 Prolene, beginning at the pubic tubercle, running this along the shelving edge inferiorly. Superiorly, the mesh was secured to the internal oblique fascia with interrupted 2-0 Prolene sutures.  The tails of the mesh were sutured together behind the spermatic cord.  The mesh was tucked underneath the external oblique fascia laterally.  The external oblique fascia was reapproximated with 2-0 Vicryl.  3-0 Vicryl was used to close the subcutaneous tissues and 4-0 Monocryl was used to close the skin in subcuticular fashion.  Benzoin and steri-strips were used to seal the incision.  A clean dressing was applied.  The patient was then extubated and brought to the recovery room in stable condition.  All sponge, instrument, and needle counts were correct prior to closure and at the conclusion of the case.   Estimated Blood Loss: Minimal                 Complications: None; patient tolerated the procedure well.         Disposition: PACU - hemodynamically stable.         Condition: stable  Leighton Ruff. Redmond Pulling, MD, FACS General, Bariatric, & Minimally Invasive Surgery [facility], [place]

## 2015-06-02 NOTE — Anesthesia Postprocedure Evaluation (Signed)
Anesthesia Post Note  Patient: Reginald [name]  Procedure(s) Performed: Procedure(s) (LRB): RIGHT INGUINAL HERNIA REPAIR WITH MESH (Right) INSERTION OF MESH (Right)  Patient location during evaluation: PACU Anesthesia Type: General and Regional Level of consciousness: awake and alert Pain management: pain level controlled Vital Signs Assessment: post-procedure vital signs reviewed and stable Respiratory status: spontaneous breathing, nonlabored ventilation, respiratory function stable and patient connected to nasal cannula oxygen Cardiovascular status: blood pressure returned to baseline and stable Postop Assessment: no signs of nausea or vomiting Anesthetic complications: no    Last Vitals:  Filed Vitals:   06/02/15 1730 06/02/15 1745  BP: 168/103   Pulse: 75 55  Temp: 36.8 C   Resp: 13     Last Pain:  Filed Vitals:   06/02/15 1751  PainSc: 0-No pain                 Jenice Leiner J

## 2015-06-02 NOTE — H&P (View-Only) (Signed)
History of Present Illness Reginald [name]. Reginald Wolfgang MD; 05/16/2015 10:04 AM) The patient is a 50 year old male who presents with an inguinal hernia. PCP - [facility] - Dr. Laurance Flatten  Reason: right inguinal hernia  This is a 50 year old male with a past medical history significant only for heavy tobacco abuse who presents with a three-week history of right groin swelling and pain. The patient had a severe coughing episode and felt a burning tearing sensation in his right groin. Subsequently he developed a bulge in this area. It remains reducible. He was actually evaluated in the emergency department but was found to have a reducible hernia. He is now referred to discuss surgical repair.  The patient smokes 1.5 packs per day which is down from 3 packs per day. He does have a chronic cough.   Other Problems Elbert Ewings, CMA; 05/16/2015 9:00 AM) Arthritis Back Pain Inguinal Hernia Migraine Headache  Past Surgical History Elbert Ewings, CMA; 05/16/2015 9:00 AM) No pertinent past surgical history  Diagnostic Studies History Elbert Ewings, CMA; 05/16/2015 9:00 AM) Colonoscopy never  Allergies Elbert Ewings, CMA; 05/16/2015 9:01 AM) Penicillin V Potassium *PENICILLINS* Rash.  Medication History Elbert Ewings, CMA; 05/16/2015 9:01 AM) Hydrocodone-Acetaminophen (5-325MG  Tablet, Oral) Active. Zofran (4MG  Tablet, Oral) Active. Medications Reconciled  Social History Elbert Ewings, CMA; 05/16/2015 9:00 AM) Alcohol use Remotely quit alcohol use. Caffeine use Carbonated beverages, Coffee. Tobacco use Current every day smoker.  Family History Elbert Ewings, [place]; 05/16/2015 9:00 AM) Migraine Headache Father. Respiratory Condition Father.     Review of Systems Elbert Ewings CMA; 05/16/2015 9:00 AM) General Present- Appetite Loss and Night Sweats. Not Present- Chills, Fatigue, Fever, Weight Gain and Weight Loss. Skin Not Present- Change in Wart/Mole, Dryness, Hives,  Jaundice, New Lesions, Non-Healing Wounds, Rash and Ulcer. HEENT Present- Ringing in the Ears. Not Present- Earache, Hearing Loss, Hoarseness, Nose Bleed, Oral Ulcers, Seasonal Allergies, Sinus Pain, Sore Throat, Visual Disturbances, Wears glasses/contact lenses and Yellow Eyes. Respiratory Present- Chronic Cough. Not Present- Bloody sputum, Difficulty Breathing, Snoring and Wheezing. Breast Not Present- Breast Mass, Breast Pain, Nipple Discharge and Skin Changes. Cardiovascular Not Present- Chest Pain, Difficulty Breathing Lying Down, Leg Cramps, Palpitations, Rapid Heart Rate, Shortness of Breath and Swelling of Extremities. Gastrointestinal Present- Abdominal Pain, Bloating, Change in Bowel Habits, Excessive gas, Gets full quickly at meals, Nausea and Vomiting. Not Present- Bloody Stool, Chronic diarrhea, Constipation, Difficulty Swallowing, Hemorrhoids, Indigestion and Rectal Pain. Male Genitourinary Present- Blood in Urine, Change in Urinary Stream, Frequency, Nocturia and Urgency. Not Present- Impotence, Painful Urination and Urine Leakage.  Vitals Elbert Ewings CMA; 05/16/2015 9:01 AM) 05/16/2015 9:01 AM Weight: 146 lb Height: 68in Body Surface Area: 1.79 m Body Mass Index: 22.2 kg/m  Temp.: 97.61F  Pulse: 80 (Regular)  BP: 132/84 (Sitting, Left Arm, Standard)      Physical Exam Rodman Key K. Alicia Ackert MD; 05/16/2015 10:06 AM)  The physical exam findings are as follows: Note:WDWN in NAD - smells heavily of tobacco smoke HEENT: EOMI, sclera anicteric Neck: No masses, no thyromegaly Lungs: bilateral rhonchi; normal respiratory effort CV: Regular rate and rhythm; no murmurs Abd: +bowel sounds, soft, non-tender, no masses GU: bilateral descended testes; no testicular masses; visible reducible right inguinal hernia; no sign of left inguinal hernia Ext: Well-perfused; no edema Skin: Warm, dry; no sign of jaundice    Assessment & Plan Rodman Key K. Dathan Attia MD; 05/16/2015 9:32  AM)  RIGHT INGUINAL HERNIA (K40.90)  Current Plans Schedule for Surgery - Right inguinal hernia repair with  mesh. The surgical procedure has been discussed with the patient. Potential risks, benefits, alternative treatments, and expected outcomes have been explained. All of the patient's questions at this time have been answered. The likelihood of reaching the patient's treatment goal is good. The patient understand the proposed surgical procedure and wishes to proceed.   Reginald [name]. Georgette Dover, MD, [facility]  General/ Trauma Surgery  05/16/2015 10:06 AM

## 2015-06-02 NOTE — Anesthesia Preprocedure Evaluation (Addendum)
Anesthesia Evaluation  Patient identified by MRN, date of birth, ID band Patient awake    Reviewed: Allergy & Precautions, NPO status , Patient's Chart, lab work & pertinent test results  History of Anesthesia Complications Negative for: history of anesthetic complications  Airway Mallampati: II  TM Distance: >3 FB Neck ROM: full    Dental  (+) Edentulous Upper, Dental Advisory Given   Pulmonary Current Smoker,    breath sounds clear to auscultation       Cardiovascular negative cardio ROS   Rhythm:regular Rate:Normal     Neuro/Psych  Headaches,    GI/Hepatic negative GI ROS, Neg liver ROS,   Endo/Other  negative endocrine ROS  Renal/GU negative Renal ROS  negative genitourinary   Musculoskeletal  (+) Arthritis ,   Abdominal   Peds  Hematology negative hematology ROS (+)   Anesthesia Other Findings   Reproductive/Obstetrics negative OB ROS                            Anesthesia Physical Anesthesia Plan  ASA: II  Anesthesia Plan: General and Regional   Post-op Pain Management:  Regional for Post-op pain   Induction: Intravenous  Airway Management Planned: Oral ETT  Additional Equipment:   Intra-op Plan:   Post-operative Plan: Extubation in OR  Informed Consent: I have reviewed the patients History and Physical, chart, labs and discussed the procedure including the risks, benefits and alternatives for the proposed anesthesia with the patient or authorized representative who has indicated his/her understanding and acceptance.     Plan Discussed with: CRNA, Anesthesiologist and Surgeon  Anesthesia Plan Comments:         Anesthesia Quick Evaluation

## 2015-06-02 NOTE — Anesthesia Procedure Notes (Addendum)
Anesthesia Regional Block:  TAP block  Pre-Anesthetic Checklist: ,, timeout performed, Correct Patient, Correct Site, Correct Laterality, Correct Procedure, Correct Position, site marked, Risks and benefits discussed,  Surgical consent,  Pre-op evaluation,  At surgeon's request and post-op pain management  Laterality: Right  Prep: chloraprep       Needles:  Injection technique: Single-shot  Needle Type: Echogenic Needle     Needle Length: 9cm 9 cm Needle Gauge: 21 and 21 G    Additional Needles:  Procedures: ultrasound guided (picture in chart) TAP block Narrative:  Start time: 06/02/2015 3:00 PM End time: 06/02/2015 3:10 PM Injection made incrementally with aspirations every 5 mL.  Performed by: Personally  Anesthesiologist: HODIERNE, ADAM  Additional Notes: Pt tolerated the procedure well.   Procedure Name: Intubation Date/Time: 06/02/2015 3:37 PM Performed by: Jenne Campus Pre-anesthesia Checklist: Patient identified, Emergency Drugs available, Suction available and Patient being monitored Patient Re-evaluated:Patient Re-evaluated prior to inductionOxygen Delivery Method: Circle System Utilized Preoxygenation: Pre-oxygenation with 100% oxygen Intubation Type: IV induction Ventilation: Mask ventilation without difficulty and Oral airway inserted - appropriate to patient size Laryngoscope Size: Miller and 2 Grade View: Grade I Tube type: Oral Tube size: 7.5 mm Number of attempts: 1 Airway Equipment and Method: Stylet and Oral airway Placement Confirmation: ETT inserted through vocal cords under direct vision,  positive ETCO2 and breath sounds checked- equal and bilateral Secured at: 21 cm Tube secured with: Tape Dental Injury: Teeth and Oropharynx as per pre-operative assessment

## 2015-06-02 NOTE — Transfer of Care (Signed)
Immediate Anesthesia Transfer of Care Note  Patient: Reginald [name]  Procedure(s) Performed: Procedure(s): RIGHT INGUINAL HERNIA REPAIR WITH MESH (Right) INSERTION OF MESH (Right)  Patient Location: PACU  Anesthesia Type:General  Level of Consciousness: awake, alert , oriented and patient cooperative  Airway & Oxygen Therapy: Patient Spontanous Breathing and Patient connected to nasal cannula oxygen  Post-op Assessment: Report given to RN and Post -op Vital signs reviewed and stable  Post vital signs: Reviewed and stable  Last Vitals:  Filed Vitals:   06/02/15 1514 06/02/15 1518  BP:  153/67  Pulse: 96 87  Temp:    Resp: 22 15    Last Pain: There were no vitals filed for this visit.    Patients Stated Pain Goal: 3 ([id number])  Complications: No apparent anesthesia complications

## 2015-06-03 ENCOUNTER — Encounter (HOSPITAL_COMMUNITY): Payer: Self-pay | Admitting: General Surgery

## 2017-06-10 ENCOUNTER — Ambulatory Visit (INDEPENDENT_AMBULATORY_CARE_PROVIDER_SITE_OTHER): Payer: 59 | Admitting: Family

## 2017-06-10 ENCOUNTER — Telehealth: Payer: Self-pay | Admitting: Family

## 2017-06-10 ENCOUNTER — Encounter: Payer: Self-pay | Admitting: Family

## 2017-06-10 ENCOUNTER — Ambulatory Visit (HOSPITAL_COMMUNITY)
Admission: RE | Admit: 2017-06-10 | Discharge: 2017-06-10 | Disposition: A | Discharge status: Home / Self Care | Payer: 59 | Source: Ambulatory Visit | Attending: Family | Admitting: Family

## 2017-06-10 VITALS — BP 163/100 | HR 77 | Temp 98.3°F | Ht 68.0 in | Wt 129.8 lb

## 2017-06-10 DIAGNOSIS — I861 Scrotal varices: Secondary | ICD-10-CM | POA: Insufficient documentation

## 2017-06-10 DIAGNOSIS — N50811 Right testicular pain: Secondary | ICD-10-CM | POA: Insufficient documentation

## 2017-06-10 DIAGNOSIS — N433 Hydrocele, unspecified: Secondary | ICD-10-CM | POA: Diagnosis not present

## 2017-06-10 DIAGNOSIS — N5089 Other specified disorders of the male genital organs: Secondary | ICD-10-CM | POA: Insufficient documentation

## 2017-06-10 LAB — URINALYSIS, COMPLETE
Bilirubin, UA: NEGATIVE
Glucose, UA: NEGATIVE
KETONES UA: NEGATIVE
LEUKOCYTES UA: NEGATIVE
NITRITE UA: NEGATIVE
PH UA: 6 (ref 5.0–7.5)
SPEC GRAV UA: 1.02 (ref 1.005–1.030)
Urobilinogen, Ur: 4 mg/dL — ABNORMAL HIGH (ref 0.2–1.0)

## 2017-06-10 LAB — MICROSCOPIC EXAMINATION

## 2017-06-10 MED ORDER — CEFTRIAXONE SODIUM 1 G IJ SOLR
250.0000 mg | Freq: Once | INTRAMUSCULAR | Status: AC
  Administered 2017-06-10: 250 mg via INTRAMUSCULAR

## 2017-06-10 MED ORDER — DOXYCYCLINE HYCLATE 100 MG PO TABS: 100.0000 mg | ORAL_TABLET | Freq: Two times a day (BID) | ORAL | 0 refills | Status: DC

## 2017-06-10 NOTE — Progress Notes (Signed)
   Subjective:    Patient ID: Reginald [name], male    DOB: 11/12/1965, 52 y.o.   MRN: [id number]  HPI PT presents to the office today with right testicular swelling that he noticed about a week ago that has become worse. Denies any dysuria, discharge, or fevers. States he constant pain of aching of 10 out 10. Denies any injury.   Pt had a right hernia in 2017.    Review of Systems  Genitourinary: Positive for scrotal swelling and testicular pain. Negative for flank pain, frequency, hematuria, penile pain and urgency.  All other systems reviewed and are negative.      Objective:   Physical Exam  Constitutional: He is oriented to person, place, and time. He appears well-developed and well-nourished. No distress.  HENT:  Head: Normocephalic.  Eyes: Pupils are equal, round, and reactive to light. Right eye exhibits no discharge. Left eye exhibits no discharge.  Neck: Normal range of motion. Neck supple. No thyromegaly present.  Cardiovascular: Normal rate, regular rhythm, normal heart sounds and intact distal pulses.  No murmur heard. Pulmonary/Chest: Effort normal and breath sounds normal. No respiratory distress. He has no wheezes.  Abdominal: Soft. Bowel sounds are normal. He exhibits no distension. There is no tenderness.  Genitourinary: Right testis shows mass, swelling and tenderness. Right testis is descended.  Musculoskeletal: Normal range of motion. He exhibits no edema or tenderness.  Neurological: He is alert and oriented to person, place, and time. He has normal reflexes. No cranial nerve deficit.  Skin: Skin is warm and dry. No rash noted. No erythema.  Psychiatric: He has a normal mood and affect. His behavior is normal. Judgment and thought content normal.  Vitals reviewed.    BP (!) 163/100   Pulse 77   Temp 98.3 F (36.8 C) (Oral)   Ht 5\' 8"  (1.727 m)   Wt 129 lb 12.8 oz (58.9 kg)   BMI 19.74 kg/m      Assessment & Plan:  [name] was seen today for right  testicle swollen.  Diagnoses and all orders for this visit:  Swelling of right testicle -     Chlamydia/Gonococcus/Trichomonas, NAA -     Urinalysis, Complete -     US SCROTUM W/DOPPLER; Future -     doxycycline (VIBRA-TABS) 100 MG tablet; Take 1 tablet (100 mg total) by mouth 2 (two) times daily. -     cefTRIAXone (ROCEPHIN) injection 250 mg -     Urine Culture  Pain in right testicle -     Chlamydia/Gonococcus/Trichomonas, NAA -     Urinalysis, Complete -     US SCROTUM W/DOPPLER; Future -     doxycycline (VIBRA-TABS) 100 MG tablet; Take 1 tablet (100 mg total) by mouth 2 (two) times daily. -     cefTRIAXone (ROCEPHIN) injection 250 mg -     Urine Culture   Will treat with Rocephin and doxycycline  Urine culture and Chlamydia and Gonorrhea pending Stat Scrotum US order  RTO if symptoms do not improve or worsen  Evelina Dun, FNP

## 2017-06-10 NOTE — Patient Instructions (Signed)
Epididymitis Epididymitis is swelling (inflammation) of the epididymis. The epididymis is a cord-like structure that is located along the top and back part of the testicle. It collects and stores sperm from the testicle. This condition can also cause pain and swelling of the testicle and scrotum. Symptoms usually start suddenly (acute epididymitis). Sometimes epididymitis starts gradually and lasts for a while (chronic epididymitis). This type may be harder to treat. What are the causes? In men 35 and younger, this condition is usually caused by a bacterial infection or sexually transmitted disease (STD), such as:  Gonorrhea.  Chlamydia.  In men 35 and older who do not have anal sex, this condition is usually caused by bacteria from a blockage or abnormalities in the urinary system. These can result from:  Having a tube placed into the bladder (urinary catheter).  Having an enlarged or inflamed prostate gland.  Having recent urinary tract surgery.  In men who have a condition that weakens the body's defense system (immune system), such as HIV, this condition can be caused by:  Other bacteria, including tuberculosis and syphilis.  Viruses.  Fungi.  Sometimes this condition occurs without infection. That may happen if urine flows backward into the epididymis after heavy lifting or straining. What increases the risk? This condition is more likely to develop in men:  Who have unprotected sex with more than one partner.  Who have anal sex.  Who have recently had surgery.  Who have a urinary catheter.  Who have urinary problems.  Who have a suppressed immune system.  What are the signs or symptoms? This condition usually begins suddenly with chills, fever, and pain behind the scrotum and in the testicle. Other symptoms include:  Swelling of the scrotum, testicle, or both.  Pain whenejaculatingor urinating.  Pain in the back or belly.  Nausea.  Itching and discharge  from the penis.  Frequent need to pass urine.  Redness and tenderness of the scrotum.  How is this diagnosed? Your health care provider can diagnose this condition based on your symptoms and medical history. Your health care provider will also do a physical exam to ask about your symptoms and check your scrotum and testicle for swelling, pain, and redness. You may also have other tests, including:  Examination of discharge from the penis.  Urine tests for infections, such as STDs.  Your health care provider may test you for other STDs, including HIV. How is this treated? Treatment for this condition depends on the cause. If your condition is caused by a bacterial infection, oral antibiotic medicine may be prescribed. If the bacterial infection has spread to your blood, you may need to receive IV antibiotics. Nonbacterial epididymitis is treated with home care that includes bed rest and elevation of the scrotum. Surgery may be needed to treat:  Bacterial epididymitis that causes pus to build up in the scrotum (abscess).  Chronic epididymitis that has not responded to other treatments.  Follow these instructions at home: Medicines  Take over-the-counter and prescription medicines only as told by your health care provider.  If you were prescribed an antibiotic medicine, take it as told by your health care provider. Do not stop taking the antibiotic even if your condition improves. Sexual Activity  If your epididymitis was caused by an STD, avoid sexual activity until your treatment is complete.  Inform your sexual partner or partners if you test positive for an STD. They may need to be treated.Do not engage in sexual activity with your partner or   partners until their treatment is completed. General instructions  Return to your normal activities as told by your health care provider. Ask your health care provider what activities are safe for you.  Keep your scrotum elevated and  supported while resting. Ask your health care provider if you should wear a scrotal support, such as a jockstrap. Wear it as told by your health care provider.  If directed, apply ice to the affected area: ? Put ice in a plastic bag. ? Place a towel between your skin and the bag. ? Leave the ice on for 20 minutes, 2-3 times per day.  Try taking a sitz bath to help with discomfort. This is a warm water bath that is taken while you are sitting down. The water should only come up to your hips and should cover your buttocks. Do this 3-4 times per day or as told by your health care provider.  Keep all follow-up visits as told by your health care provider. This is important. Contact a health care provider if:  You have a fever.  Your pain medicine is not helping.  Your pain is getting worse.  Your symptoms do not improve within three days. This information is not intended to replace advice given to you by your health care provider. Make sure you discuss any questions you have with your health care provider. Document Released: 01/06/2000 Document Revised: 06/16/2015 Document Reviewed: 05/26/2014 Elsevier Interactive Patient Education  2018 [employer]  

## 2017-06-11 LAB — URINE CULTURE: Organism ID, Bacteria: NO GROWTH

## 2017-06-11 LAB — CHLAMYDIA/GONOCOCCUS/TRICHOMONAS, NAA
CHLAMYDIA BY NAA: NEGATIVE
Gonococcus by NAA: NEGATIVE
TRICH VAG BY NAA: NEGATIVE

## 2017-06-11 MED ORDER — DICLOFENAC SODIUM 75 MG PO TBEC: 75.0000 mg | DELAYED_RELEASE_TABLET | Freq: Two times a day (BID) | ORAL | 0 refills | Status: DC

## 2017-06-11 NOTE — Telephone Encounter (Signed)
Diclofenac Prescription sent to pharmacy

## 2017-06-11 NOTE — Telephone Encounter (Signed)
Patient aware.

## 2017-07-08 ENCOUNTER — Other Ambulatory Visit: Payer: Self-pay | Admitting: Family

## 2018-12-26 IMAGING — US US SCROTUM W/ DOPPLER COMPLETE
1 series · 14 of 25 positions shown · non-contrast
Comparison: None.

CLINICAL DATA: Right testicular pain and swelling for 1 week.

EXAM:
SCROTAL ULTRASOUND
DOPPLER ULTRASOUND OF THE TESTICLES
TECHNIQUE: Complete ultrasound examination of the testicles, epididymis, and
other scrotal structures was performed. Color and spectral Doppler
ultrasound were also utilized to evaluate blood flow to the
testicles.

[Series 1: us scrotum w/ doppler complete · 0.07mm/px · 14 of 73 slices shown]
[im 1/73]
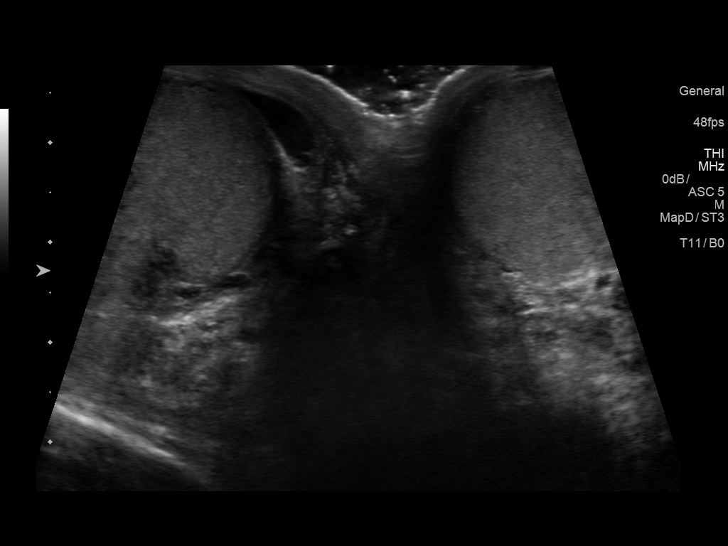
[im 7/73]
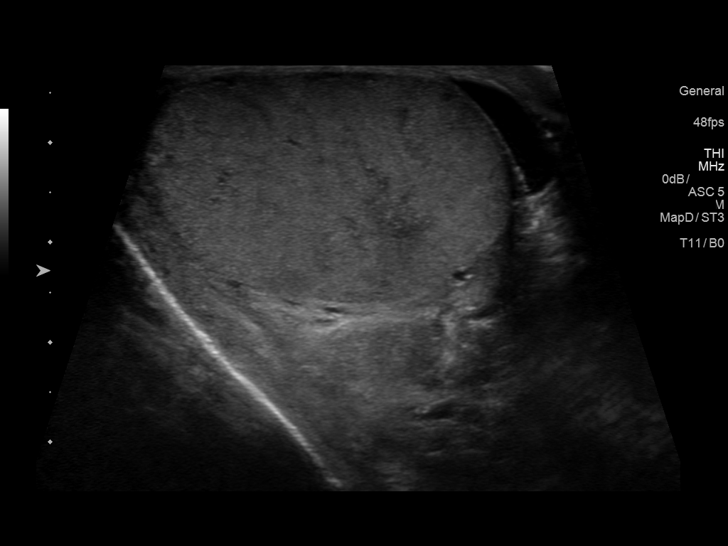
[im 13/73]
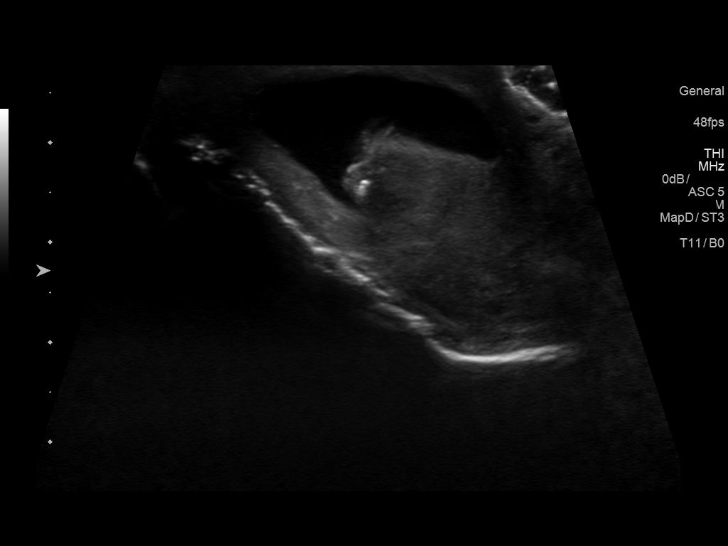
[im 19/73]
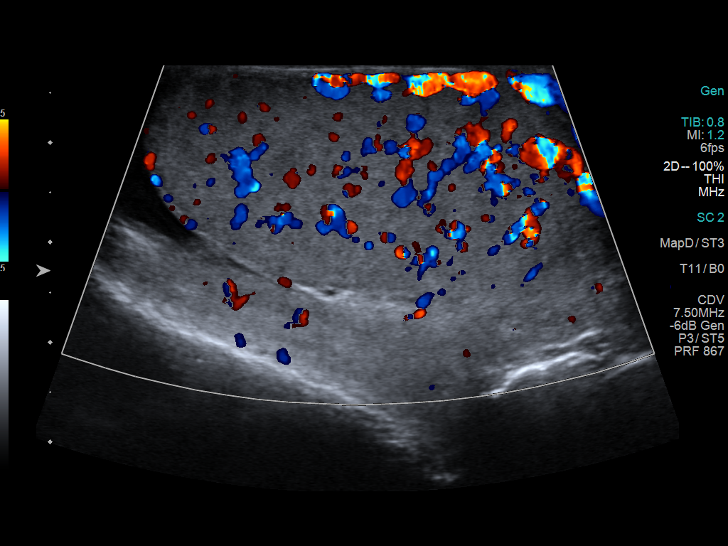
[im 25/73]
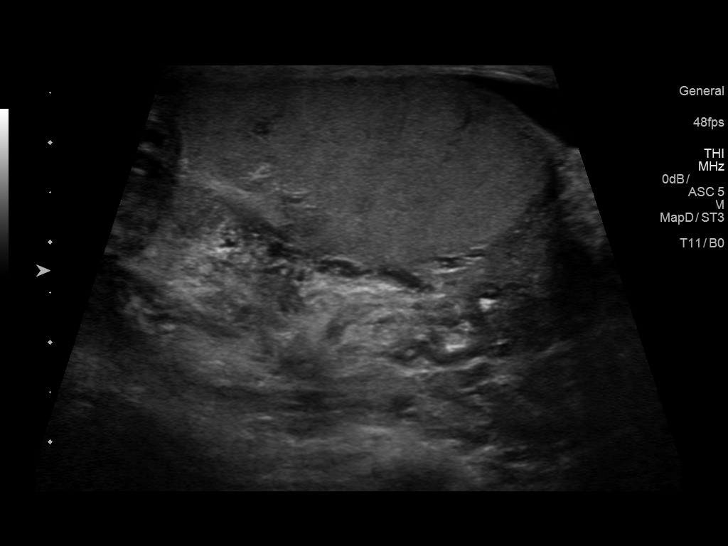
[im 28/73]
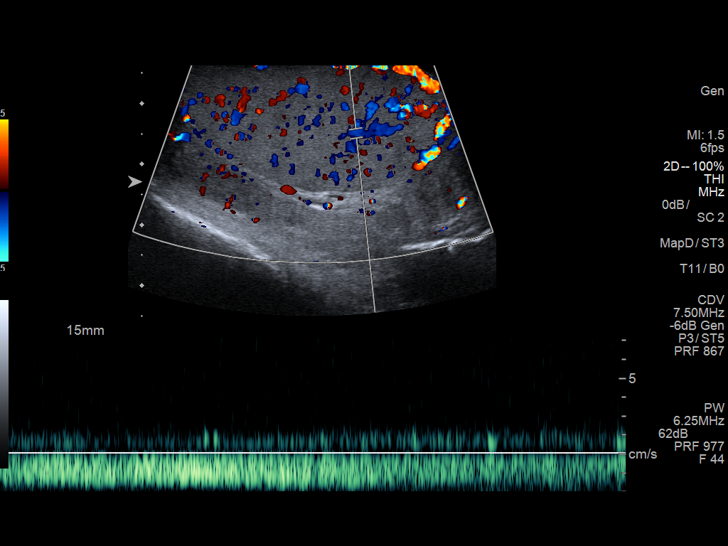
[im 34/73]
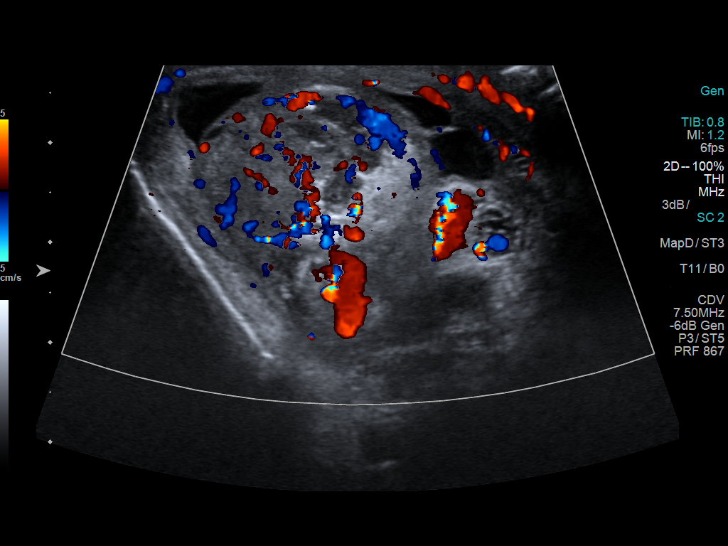
[im 40/73]
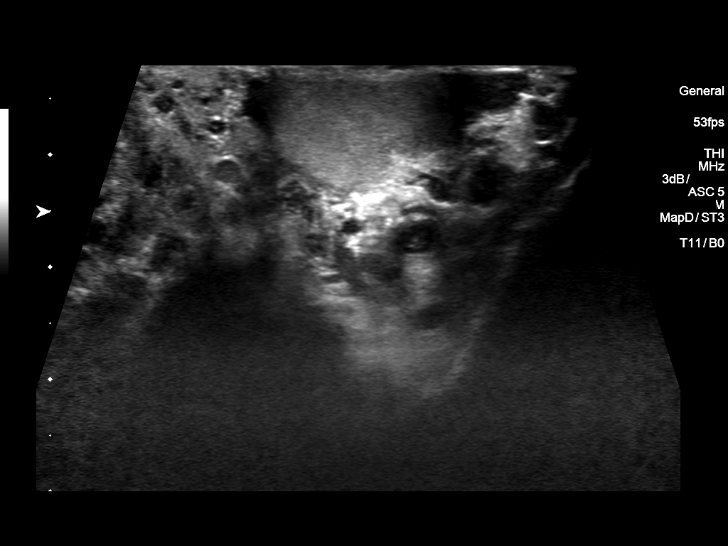
[im 46/73]
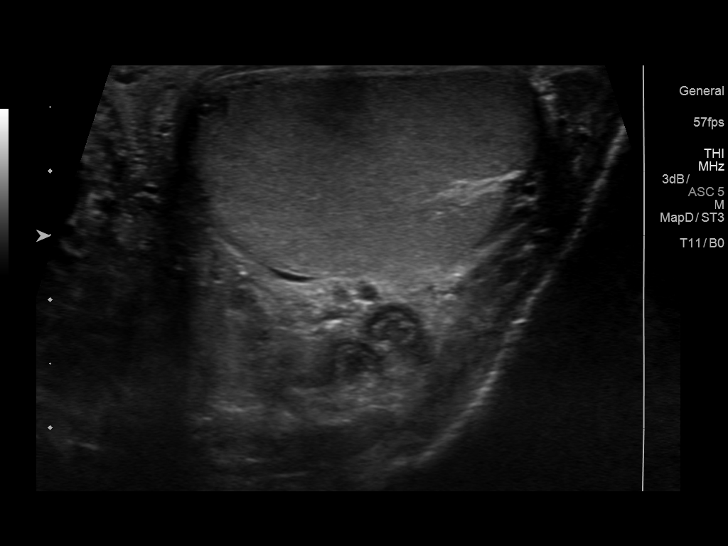
[im 49/73]
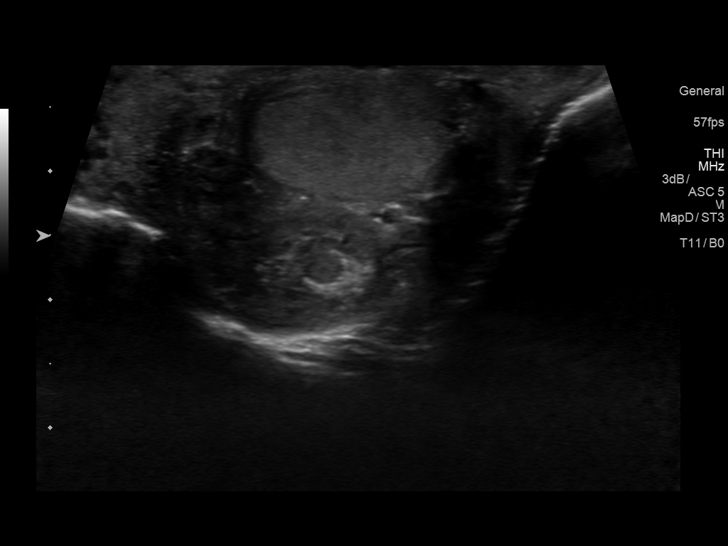
[im 55/73]
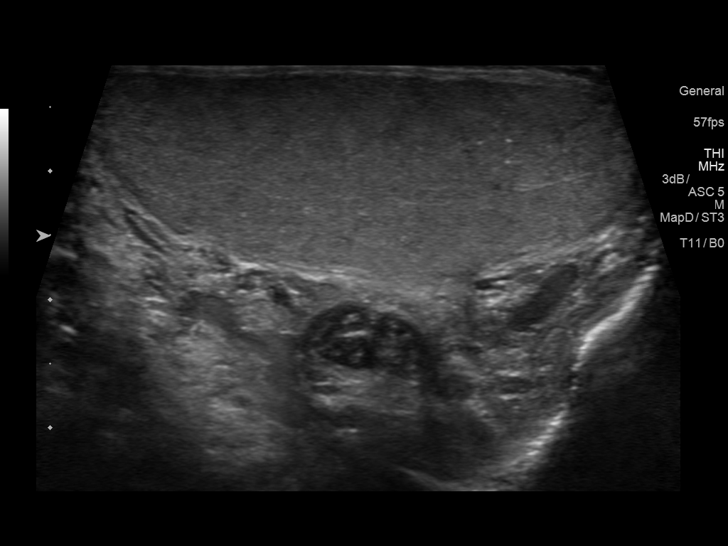
[im 61/73]
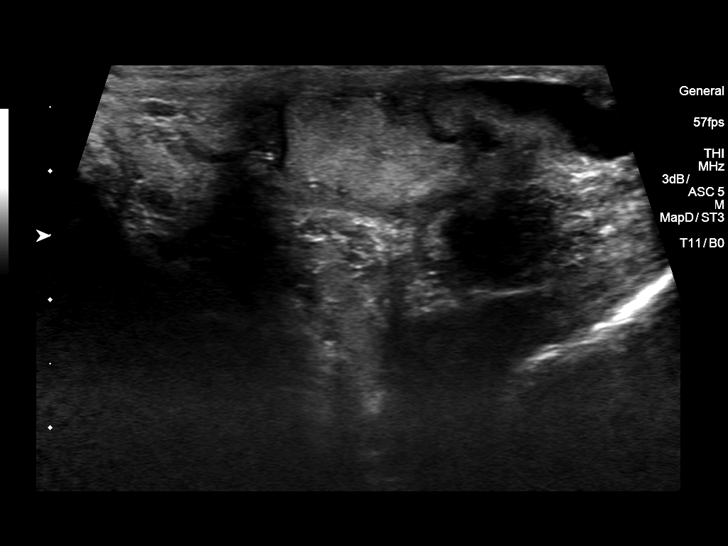
[im 67/73]
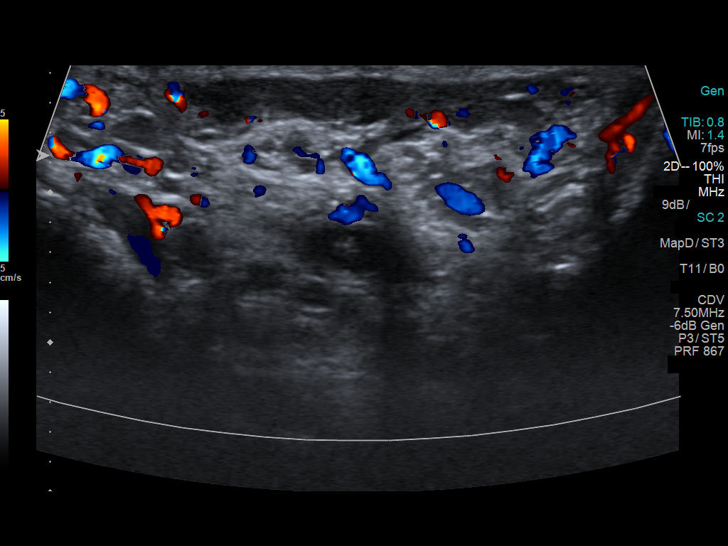
[im 73/73]
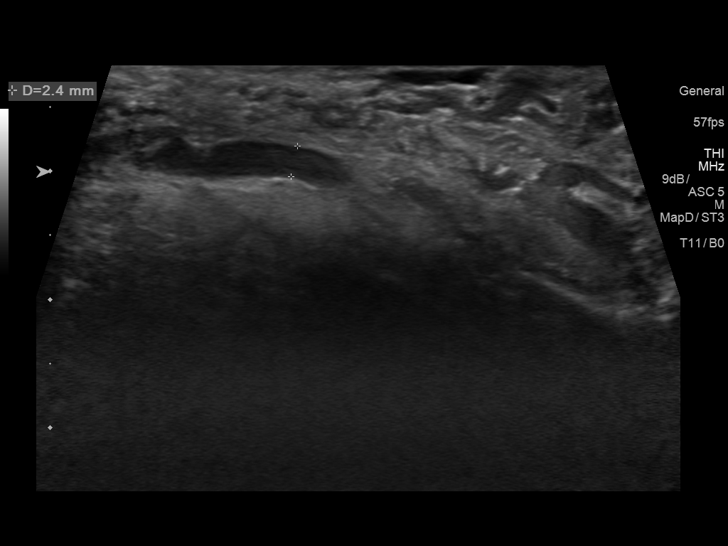

[14 of 25 positions shown; findings below may reference images not displayed]

FINDINGS: Right testicle

Measurements: 4.5 x 2.1 x 3.6 cm. No mass or microlithiasis
visualized. Diffusely increased vascularity noted.

Left testicle

Measurements: 4.3 x 1.7 x 2.7 cm. No mass or microlithiasis
visualized.

Right epididymis:  Slightly enlarged and hypervascular.

Left epididymis:  Normal in size and appearance.

Hydrocele:  Bilateral small hydrocele.

Varicocele:  Small left varicocele.

Pulsed Doppler interrogation of both testes demonstrates normal low
resistance arterial and venous waveforms bilaterally.
IMPRESSION: Hypervascular right testicle and right epididymis, likely secondary
to epididymitis / orchitis.

Small bilateral hydrocele.

Small left varicocele.

## 2021-08-16 ENCOUNTER — Emergency Department (HOSPITAL_COMMUNITY): Payer: 59

## 2021-08-16 ENCOUNTER — Emergency Department (HOSPITAL_COMMUNITY)
Admission: EM | Admit: 2021-08-16 | Discharge: 2021-08-16 | Disposition: A | Discharge status: Home / Self Care | Payer: 59 | Attending: Emergency Medicine | Admitting: Emergency Medicine

## 2021-08-16 ENCOUNTER — Encounter (HOSPITAL_COMMUNITY): Payer: Self-pay

## 2021-08-16 DIAGNOSIS — R1031 Right lower quadrant pain: Secondary | ICD-10-CM

## 2021-08-16 DIAGNOSIS — N509 Disorder of male genital organs, unspecified: Secondary | ICD-10-CM | POA: Insufficient documentation

## 2021-08-16 DIAGNOSIS — R197 Diarrhea, unspecified: Secondary | ICD-10-CM | POA: Insufficient documentation

## 2021-08-16 DIAGNOSIS — R112 Nausea with vomiting, unspecified: Secondary | ICD-10-CM | POA: Diagnosis not present

## 2021-08-16 LAB — URINALYSIS, ROUTINE W REFLEX MICROSCOPIC
Bilirubin Urine: NEGATIVE
Glucose, UA: NEGATIVE mg/dL
Hgb urine dipstick: NEGATIVE
Ketones, ur: NEGATIVE mg/dL
Leukocytes,Ua: NEGATIVE
Nitrite: NEGATIVE
Protein, ur: NEGATIVE mg/dL
Specific Gravity, Urine: 1.004 — ABNORMAL LOW (ref 1.005–1.030)
pH: 6 (ref 5.0–8.0)

## 2021-08-16 LAB — COMPREHENSIVE METABOLIC PANEL
ALT: 13 U/L (ref 0–44)
AST: 15 U/L (ref 15–41)
Albumin: 4.7 g/dL (ref 3.5–5.0)
Alkaline Phosphatase: 85 U/L (ref 38–126)
Anion gap: 8 (ref 5–15)
BUN: 13 mg/dL (ref 6–20)
CO2: 27 mmol/L (ref 22–32)
Calcium: 9.2 mg/dL (ref 8.9–10.3)
Chloride: 107 mmol/L (ref 98–111)
Creatinine, Ser: 0.82 mg/dL (ref 0.61–1.24)
GFR, Estimated: >60 mL/min (ref >60)
Glucose, Bld: 105 mg/dL — ABNORMAL HIGH (ref 70–99)
Potassium: 4.3 mmol/L (ref 3.5–5.1)
Sodium: 142 mmol/L (ref 135–145)
Total Bilirubin: 0.6 mg/dL (ref 0.3–1.2)
Total Protein: 7.7 g/dL (ref 6.5–8.1)

## 2021-08-16 LAB — CBC
HCT: 50.1 % (ref 39.0–52.0)
Hemoglobin: 16.9 g/dL (ref 13.0–17.0)
MCH: 31.5 pg (ref 26.0–34.0)
MCHC: 33.7 g/dL (ref 30.0–36.0)
MCV: 93.5 fL (ref 80.0–100.0)
Platelets: 250 10*3/uL (ref 150–400)
RBC: 5.36 MIL/uL (ref 4.22–5.81)
RDW: 13.2 % (ref 11.5–15.5)
WBC: 9.4 10*3/uL (ref 4.0–10.5)
nRBC: 0 % (ref 0.0–0.2)

## 2021-08-16 LAB — LIPASE, BLOOD: Lipase: 34 U/L (ref 11–51)

## 2021-08-16 MED ORDER — DIPHENOXYLATE-ATROPINE 2.5-0.025 MG PO TABS
2.0000 | ORAL_TABLET | Freq: Once | ORAL | Status: AC
  Administered 2021-08-16: 2 via ORAL
  Filled 2021-08-16: qty 2

## 2021-08-16 MED ORDER — ONDANSETRON HCL 4 MG/2ML IJ SOLN
4.0000 mg | Freq: Once | INTRAMUSCULAR | Status: AC
  Administered 2021-08-16: 4 mg via INTRAVENOUS
  Filled 2021-08-16: qty 2

## 2021-08-16 MED ORDER — HYDROCODONE-ACETAMINOPHEN 5-325 MG PO TABS: 1.0000 | ORAL_TABLET | Freq: Four times a day (QID) | ORAL | 0 refills | Status: DC | PRN

## 2021-08-16 MED ORDER — MORPHINE SULFATE (PF) 4 MG/ML IV SOLN
4.0000 mg | Freq: Once | INTRAVENOUS | Status: AC
  Administered 2021-08-16: 4 mg via INTRAVENOUS
  Filled 2021-08-16: qty 1

## 2021-08-16 MED ORDER — LOPERAMIDE HCL 2 MG PO CAPS: 2.0000 mg | ORAL_CAPSULE | Freq: Four times a day (QID) | ORAL | 0 refills | Status: DC | PRN

## 2021-08-16 MED ORDER — ONDANSETRON HCL 4 MG PO TABS: 4.0000 mg | ORAL_TABLET | Freq: Four times a day (QID) | ORAL | 0 refills | Status: DC

## 2021-08-16 MED ORDER — SODIUM CHLORIDE 0.9 % IV BOLUS
1000.0000 mL | Freq: Once | INTRAVENOUS | Status: AC
  Administered 2021-08-16: 1000 mL via INTRAVENOUS

## 2021-08-16 MED ORDER — IOHEXOL 300 MG/ML  SOLN
80.0000 mL | Freq: Once | INTRAMUSCULAR | Status: AC | PRN
  Administered 2021-08-16: 80 mL via INTRAVENOUS

## 2021-08-16 NOTE — ED Triage Notes (Signed)
Pt to ED via POV accompanied by g/f c/o abdominal pain for several months but progressively getting worse. HX: hernia repair 5 years ago. C/o n/v

## 2021-08-16 NOTE — Discharge Instructions (Signed)
Use the medications prescribed as needed for diarrhea and nausea.  Your CT scan, ultrasound and lab tests are reassuring today.  However you do have a small varicocele in your right scrotum which explains the fullness that you feel, this should not be the source of your pain however.  I do recommend follow-up with gastroenterology as discussed for these frequent episodes of symptoms.  Your symptoms do not seem to be related to your hernia surgery.

## 2021-08-16 NOTE — ED Provider Notes (Signed)
[facility] Provider Note   CSN: [id number] Arrival date & time: 08/16/21  1115     History  Chief Complaint  Patient presents with   Abdominal Pain   Emesis    Reginald [name] is a 56 y.o. male with no significant past medical hx, surgical history of right inguinal hernia repair, presenting with intermittent rlq abdominal pain which has been present for the past several months.  He describes intermittent cramping pain which can last for days, accompanied by n/v and diarrhea, then resolves.  He reports escalation of episodes, having 1-2 episodes per week causing significant work loss.  He denies fever, chest pain, sob, hematemesis, blood in stools and urinary complaints.  He has taken pepto bismol with no relief of sx. He also reports a nodule present in his right scrotum present since his hernia surgery, denies pain. He has concerns sx could be complication of his hernia/mesh surgery.  The history is provided by the patient and the spouse.       Home Medications Prior to Admission medications   Medication Sig Start Date End Date Taking? Authorizing Provider  HYDROcodone-acetaminophen (NORCO/VICODIN) 5-325 MG tablet Take 1 tablet by mouth every 6 (six) hours as needed for severe pain. 08/16/21  Yes Guadalupe Kerekes, Almyra Free, PA-C  loperamide (IMODIUM) 2 MG capsule Take 1 capsule (2 mg total) by mouth 4 (four) times daily as needed for diarrhea or loose stools. 08/16/21  Yes Landi Biscardi, Almyra Free, PA-C  ondansetron (ZOFRAN) 4 MG tablet Take 1 tablet (4 mg total) by mouth every 6 (six) hours. 08/16/21  Yes Lorre Opdahl, Almyra Free, PA-C  diclofenac (VOLTAREN) 75 MG EC tablet Take 1 tablet (75 mg total) by mouth 2 (two) times daily. Patient not taking: Reported on 08/16/2021 06/11/17   Sharion Balloon, FNP  doxycycline (VIBRA-TABS) 100 MG tablet Take 1 tablet (100 mg total) by mouth 2 (two) times daily. Patient not taking: Reported on 08/16/2021 06/10/17   Sharion Balloon, FNP      Allergies    Penicillins     Review of Systems   Review of Systems  Constitutional:  Negative for chills and fever.  HENT: Negative.    Eyes: Negative.   Respiratory:  Negative for chest tightness and shortness of breath.   Cardiovascular:  Negative for chest pain.  Gastrointestinal:  Positive for abdominal pain, diarrhea, nausea and vomiting. Negative for blood in stool.  Genitourinary:  Positive for scrotal swelling. Negative for dysuria and hematuria.  Musculoskeletal:  Negative for arthralgias, joint swelling and neck pain.  Skin: Negative.  Negative for rash and wound.  Neurological:  Negative for dizziness, weakness, light-headedness, numbness and headaches.  Psychiatric/Behavioral: Negative.      Physical Exam Updated Vital Signs BP (!) 145/91   Pulse 67   Temp 98 F (36.7 C) (Oral)   Resp 19   Ht '5\' 8"'$  (1.727 m)   Wt 64.9 kg   SpO2 94%   BMI 21.74 kg/m  Physical Exam Vitals and nursing note reviewed. Exam conducted with a chaperone present.  Constitutional:      Appearance: He is well-developed.  HENT:     Head: Normocephalic and atraumatic.  Eyes:     Conjunctiva/sclera: Conjunctivae normal.  Cardiovascular:     Rate and Rhythm: Normal rate and regular rhythm.     Heart sounds: Normal heart sounds.  Pulmonary:     Effort: Pulmonary effort is normal.     Breath sounds: Normal breath sounds. No wheezing.  Abdominal:  General: Bowel sounds are normal. There is no distension.     Palpations: Abdomen is soft. There is no mass.     Tenderness: There is abdominal tenderness in the right lower quadrant. There is no guarding or rebound. Negative signs include McBurney's sign and psoas sign.  Genitourinary:    Testes:        Right: Mass present. Tenderness not present.  Musculoskeletal:        General: Normal range of motion.     Cervical back: Normal range of motion.  Skin:    General: Skin is warm and dry.  Neurological:     Mental Status: He is alert.     ED Results / Procedures  / Treatments   Labs (all labs ordered are listed, but only abnormal results are displayed) Labs Reviewed  COMPREHENSIVE METABOLIC PANEL - Abnormal; Notable for the following components:      Result Value   Glucose, Bld 105 (*)    All other components within normal limits  URINALYSIS, ROUTINE W REFLEX MICROSCOPIC - Abnormal; Notable for the following components:   Color, Urine STRAW (*)    Specific Gravity, Urine 1.004 (*)    All other components within normal limits  LIPASE, BLOOD  CBC    EKG None  Radiology Results for orders placed or performed during the hospital encounter of 08/16/21  Lipase, blood  Result Value Ref Range   Lipase 34 11 - 51 U/L  Comprehensive metabolic panel  Result Value Ref Range   Sodium 142 135 - 145 mmol/L   Potassium 4.3 3.5 - 5.1 mmol/L   Chloride 107 98 - 111 mmol/L   CO2 27 22 - 32 mmol/L   Glucose, Bld 105 (H) 70 - 99 mg/dL   BUN 13 6 - 20 mg/dL   Creatinine, Ser 0.82 0.61 - 1.24 mg/dL   Calcium 9.2 8.9 - 10.3 mg/dL   Total Protein 7.7 6.5 - 8.1 g/dL   Albumin 4.7 3.5 - 5.0 g/dL   AST 15 15 - 41 U/L   ALT 13 0 - 44 U/L   Alkaline Phosphatase 85 38 - 126 U/L   Total Bilirubin 0.6 0.3 - 1.2 mg/dL   GFR, Estimated >60 >60 mL/min   Anion gap 8 5 - 15  CBC  Result Value Ref Range   WBC 9.4 4.0 - 10.5 K/uL   RBC 5.36 4.22 - 5.81 MIL/uL   Hemoglobin 16.9 13.0 - 17.0 g/dL   HCT 50.1 39.0 - 52.0 %   MCV 93.5 80.0 - 100.0 fL   MCH 31.5 26.0 - 34.0 pg   MCHC 33.7 30.0 - 36.0 g/dL   RDW 13.2 11.5 - 15.5 %   Platelets 250 150 - 400 K/uL   nRBC 0.0 0.0 - 0.2 %  Urinalysis, Routine w reflex microscopic Urine, Clean Catch  Result Value Ref Range   Color, Urine STRAW (A) YELLOW   APPearance CLEAR CLEAR   Specific Gravity, Urine 1.004 (L) 1.005 - 1.030   pH 6.0 5.0 - 8.0   Glucose, UA NEGATIVE NEGATIVE mg/dL   Hgb urine dipstick NEGATIVE NEGATIVE   Bilirubin Urine NEGATIVE NEGATIVE   Ketones, ur NEGATIVE NEGATIVE mg/dL   Protein, ur  NEGATIVE NEGATIVE mg/dL   Nitrite NEGATIVE NEGATIVE   Leukocytes,Ua NEGATIVE NEGATIVE   US SCROTUM W/DOPPLER  Result Date: 08/16/2021 CLINICAL DATA:  Right groin pain for 6 months. Previous right inguinal hernia repair 6 years ago. EXAM: SCROTAL ULTRASOUND DOPPLER ULTRASOUND OF THE  TESTICLES TECHNIQUE: Complete ultrasound examination of the testicles, epididymis, and other scrotal structures was performed. Color and spectral Doppler ultrasound were also utilized to evaluate blood flow to the testicles. COMPARISON:  CT 08/16/2021.  Ultrasound scrotum 06/10/2017 FINDINGS: Right testicle Measurements: 4.7 x 1.9 x 2.8 cm. No mass or microlithiasis visualized. Left testicle Measurements: 4 x 2 x 2.7 cm. No mass or microlithiasis visualized. Right epididymis:  Normal in size and appearance. Left epididymis:  Normal in size and appearance. Hydrocele:  Small left hydrocele. Varicocele:  Right-sided varicoceles. Pulsed Doppler interrogation of both testes demonstrates normal low resistance arterial and venous waveforms bilaterally. IMPRESSION: 1. Normal ultrasound appearance of the testicles. No evidence of testicular mass, torsion, or inflammatory change. 2. Small left hydrocele. 3. Right-sided varicocele. Electronically Signed   By: Lucienne Capers M.D.   On: 08/16/2021 17:27   CT ABDOMEN PELVIS W CONTRAST  Result Date: 08/16/2021 CLINICAL DATA:  Right lower quadrant pain EXAM: CT ABDOMEN AND PELVIS WITH CONTRAST TECHNIQUE: Multidetector CT imaging of the abdomen and pelvis was performed using the standard protocol following bolus administration of intravenous contrast. RADIATION DOSE REDUCTION: This exam was performed according to the departmental dose-optimization program which includes automated exposure control, adjustment of the mA and/or kV according to patient size and/or use of iterative reconstruction technique. CONTRAST:  35m OMNIPAQUE IOHEXOL 300 MG/ML  SOLN COMPARISON:  None Available. FINDINGS:  Lower chest: No acute abnormality Hepatobiliary: No focal hepatic abnormality. Gallbladder unremarkable. Pancreas: No focal abnormality or ductal dilatation. Spleen: No focal abnormality.  Normal size. Adrenals/Urinary Tract: Punctate nonobstructing bilateral renal stones. No ureteral stones or hydronephrosis. Adrenal glands and urinary bladder unremarkable. Stomach/Bowel: Colonic diverticulosis most pronounced in the left colon. Normal appendix. Small duodenal diverticulum along the 2nd portion of the duodenum. Stomach and small bowel otherwise unremarkable. Vascular/Lymphatic: Aortic atherosclerosis. No evidence of aneurysm or adenopathy. Reproductive: No visible focal abnormality. Other: No free fluid or free air. Musculoskeletal: No acute bony abnormality. IMPRESSION: Normal appendix. Colonic diverticulosis.  No active diverticulitis. Punctate bilateral nephrolithiasis.  No hydronephrosis. Aortic atherosclerosis. No acute findings. Electronically Signed   By: KRolm BaptiseM.D.   On: 08/16/2021 14:58      Procedures Procedures    Medications Ordered in ED Medications  sodium chloride 0.9 % bolus 1,000 mL (0 mLs Intravenous Stopped 08/16/21 1901)  ondansetron (ZOFRAN) injection 4 mg (4 mg Intravenous Given 08/16/21 1426)  morphine (PF) 4 MG/ML injection 4 mg (4 mg Intravenous Given 08/16/21 1426)  iohexol (OMNIPAQUE) 300 MG/ML solution 80 mL (80 mLs Intravenous Contrast Given 08/16/21 1454)  diphenoxylate-atropine (LOMOTIL) 2.5-0.025 MG per tablet 2 tablet (2 tablets Oral Given 08/16/21 1636)    ED Course/ Medical Decision Making/ A&P                           Medical Decision Making Pt with intermittent episodes of n/v/d with RLQ pain, unclear etiology, ddx including food intolerance, allergy, IBD, IBS, surgical complication. Labs reassuring, Ct imaging negative for acute finding, UKorearevealing right varicocele, left hydrocele,  unlikely source of episodic pain.  He was referred to GI for further  eval/management. Discussed his need for screening colonoscopy as well which he has not had.  Stable at dc  Amount and/or Complexity of Data Reviewed Labs: ordered. Radiology: ordered.    Details: CT abd/pelvis reviewed, no source of pain and sx identified.  UKoreasmall right varicocele, left hydrocele, prob not source of sx.  Risk  Prescription drug management.           Final Clinical Impression(s) / ED Diagnoses Final diagnoses:  Nausea vomiting and diarrhea  RLQ abdominal pain    Rx / DC Orders ED Discharge Orders          Ordered    loperamide (IMODIUM) 2 MG capsule  4 times daily PRN        08/16/21 1750    ondansetron (ZOFRAN) 4 MG tablet  Every 6 hours        08/16/21 1750    HYDROcodone-acetaminophen (NORCO/VICODIN) 5-325 MG tablet  Every 6 hours PRN        08/16/21 1845              Evalee Jefferson, PA-C 08/18/21 2326    Milton Ferguson, MD 08/20/21 1043

## 2021-09-04 ENCOUNTER — Encounter (INDEPENDENT_AMBULATORY_CARE_PROVIDER_SITE_OTHER): Payer: Self-pay | Admitting: Gastroenterology

## 2021-09-04 ENCOUNTER — Ambulatory Visit (INDEPENDENT_AMBULATORY_CARE_PROVIDER_SITE_OTHER): Payer: 59 | Admitting: Gastroenterology

## 2021-09-04 DIAGNOSIS — R110 Nausea: Secondary | ICD-10-CM | POA: Insufficient documentation

## 2021-09-04 DIAGNOSIS — R1010 Upper abdominal pain, unspecified: Secondary | ICD-10-CM | POA: Diagnosis not present

## 2021-09-04 DIAGNOSIS — K529 Noninfective gastroenteritis and colitis, unspecified: Secondary | ICD-10-CM

## 2021-09-04 DIAGNOSIS — R112 Nausea with vomiting, unspecified: Secondary | ICD-10-CM

## 2021-09-04 DIAGNOSIS — R109 Unspecified abdominal pain: Secondary | ICD-10-CM | POA: Insufficient documentation

## 2021-09-04 MED ORDER — DICYCLOMINE HCL 10 MG PO CAPS: 10.0000 mg | ORAL_CAPSULE | Freq: Two times a day (BID) | ORAL | 2 refills | Status: DC | PRN

## 2021-09-04 MED ORDER — ONDANSETRON HCL 4 MG PO TABS: 4.0000 mg | ORAL_TABLET | Freq: Three times a day (TID) | ORAL | 1 refills | Status: DC | PRN

## 2021-09-04 NOTE — Patient Instructions (Addendum)
Schedule EGD and colonoscopy Perform blood workup Perform stool workup Continue Zofran 4 mg q8h as needed for nausea Start Bentyl 1 tablet q12h as needed for abdominal pain

## 2021-09-04 NOTE — Progress Notes (Unsigned)
Maylon Peppers, M.D. Gastroenterology & Hepatology [facility] [address] [place], [place] [address] Primary Care Physician: Patient, No Pcp Per No address on file  Referring MD: self  Chief Complaint: Nausea, abdominal pain, vomiting and diarrhea.  History of Present Illness: Reginald [name] is a 56 y.o. male with past medical history of arthritis and migraines, who presents for evaluation of nausea, abdominal pain, vomiting and diarrhea.  Patient reports that 3-4 months ago he presented new onset of nausea, vomiting and diarrhea. He states the severity worsened for the last 2 weeks, which made him concerned as he was presenting recurrent episodes of severe pain in his lower abdomen, which he described as pressure and cramping. He states that he had significant nausea and some episodes of vomiting, has not been able to keep adequate intake of fluids due to frequent nausea. He reports he had episodes of abdominal pain and diarrhea intermittently for years but never as severe as it is right now. He is having 5-6 Bms per day, without blood or melena. Occasionally cannot sleep due to the abdominal pain, but does not have any fecal symptoms when sleeping.  The patient denies having any ever, chills, hematochezia, melena, hematemesis,  jaundice, pruritus or weight loss - has not changed his oral intake.  Patient was seen in the ED at [facility] on 08/16/2021.  Blood work-up showed normal CMP, CBC, lipase (34) and urinalysis.  CT of the abdomen and pelvis with IV contrast showed diverticulosis and bilateral punctate nephrolithiasis.  Scrotal ultrasound with Doppler was performed which showed presence of a small left hydrocele and right-sided varicocele.  Takes ibuprofen TID, has been taking it for the last 2 weeks. Only takes hydrocodone as needed , this was prescribed by the ER.  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, neg illicit drug use Surgical: inguinal hernia repair  Past Medical History: Past Medical History:  Diagnosis Date   Arthritis    Headache    migraines     Past Surgical History: Past Surgical History:  Procedure Laterality Date   INGUINAL HERNIA REPAIR Right 06/02/2015   Procedure: RIGHT INGUINAL HERNIA REPAIR WITH MESH;  Surgeon: Greer Pickerel, MD;  Location: [place];  Service: General;  Laterality: Right;   INSERTION OF MESH Right 06/02/2015   Procedure: INSERTION OF MESH;  Surgeon: Greer Pickerel, MD;  Location: [place];  Service: General;  Laterality: Right;   NO PAST SURGERIES      Family History: Family History  Problem Relation Age of Onset   Diabetes Mother    Osteoarthritis Mother    Heart disease Father     Social History: Social History   Tobacco Use  Smoking Status Heavy Smoker   Packs/day: 1.50   Types: Cigarettes  Smokeless Tobacco Former   Social History   Substance and Sexual Activity  Alcohol Use No   Alcohol/week: 0.0 standard drinks of alcohol   Comment: formerly a heavy drinker, quit 2002   Social History   Substance and Sexual Activity  Drug Use No    Allergies: Allergies  Allergen Reactions   Penicillins Rash    Has patient had a PCN reaction causing immediate rash, facial/tongue/throat swelling, SOB or lightheadedness with hypotension: Yes Has patient had a PCN reaction causing severe rash involving mucus  membranes or skin necrosis: No Has patient had a PCN reaction that required hospitalization No Has patient had a PCN reaction occurring within the last 10 years: No If all of the above answers are "NO", then may proceed with Cephalosporin use.      Medications: Current Outpatient Medications  Medication Sig Dispense Refill   HYDROcodone-acetaminophen (NORCO/VICODIN) 5-325 MG tablet  Take 1 tablet by mouth every 6 (six) hours as needed for severe pain. 20 tablet 0   loperamide (IMODIUM) 2 MG capsule Take 1 capsule (2 mg total) by mouth 4 (four) times daily as needed for diarrhea or loose stools. 12 capsule 0   ondansetron (ZOFRAN) 4 MG tablet Take 1 tablet (4 mg total) by mouth every 6 (six) hours. 12 tablet 0   diclofenac (VOLTAREN) 75 MG EC tablet Take 1 tablet (75 mg total) by mouth 2 (two) times daily. (Patient not taking: Reported on 08/16/2021) 60 tablet 0   doxycycline (VIBRA-TABS) 100 MG tablet Take 1 tablet (100 mg total) by mouth 2 (two) times daily. (Patient not taking: Reported on 08/16/2021) 20 tablet 0   No current facility-administered medications for this visit.    Review of Systems: GENERAL: negative for malaise, night sweats HEENT: No changes in hearing or vision, no nose bleeds or other nasal problems. NECK: Negative for lumps, goiter, pain and significant neck swelling RESPIRATORY: Negative for cough, wheezing CARDIOVASCULAR: Negative for chest pain, leg swelling, palpitations, orthopnea GI: SEE HPI MUSCULOSKELETAL: Negative for joint pain or swelling, back pain, and muscle pain. SKIN: Negative for lesions, rash PSYCH: Negative for sleep disturbance, mood disorder and recent psychosocial stressors. HEMATOLOGY Negative for prolonged bleeding, bruising easily, and swollen nodes. ENDOCRINE: Negative for cold or heat intolerance, polyuria, polydipsia and goiter. NEURO: negative for tremor, gait imbalance, syncope and seizures. The remainder of the review of systems is noncontributory.   Physical Exam: BP (!) 150/94 (BP Location: Left Arm, Patient Position: Sitting, Cuff Size: Small)   Pulse 79   Temp 98 F (36.7 C) (Oral)   Ht '5\' 8"'$  (1.727 m)   Wt 128 lb 3.2 oz (58.2 kg)   BMI 19.49 kg/m  GENERAL: The patient is AO x3, in no acute distress. HEENT: Head is normocephalic and atraumatic. EOMI are intact. Mouth is well hydrated and without  lesions. NECK: Supple. No masses LUNGS: Clear to auscultation. No presence of rhonchi/wheezing/rales. Adequate chest expansion HEART: RRR, normal s1 and s2. ABDOMEN: tender upon palpation of the upper abdomen and the RLQ, no guarding, no peritoneal signs, and nondistended. BS +. No masses. EXTREMITIES: Without any cyanosis, clubbing, rash, lesions or edema. NEUROLOGIC: AOx3, no focal motor deficit. SKIN: no jaundice, no rashes   Imaging/Labs: as above  I personally reviewed and interpreted the available labs, imaging and endoscopic files.  Impression and Plan: Reginald [name] is a 56 y.o. male with past medical history of arthritis and migraines, who presents for evaluation of nausea, abdominal pain, vomiting and diarrhea.  The patient has presented gastrointestinal symptoms of unclear etiology that have worsened recently.  Recent work-up in the ER was unremarkable for any intra-abdominal pathology based on cross-sectional abdominal imaging.  Given persistence of symptoms, we will need to proceed with EGD and colonoscopy to further evaluate his complaints, but will also look for other etiologies for diarrhea with serologies and fecal testing for pancreatic etiologies.  - Schedule EGD and colonoscopy -Check celiac disease panel and TSH -Check fecal fat and fecal elastase -Continue Zofran 4 mg q8h as needed  for nausea -Start Bentyl 1 tablet q12h as needed for abdominal pain  All questions were answered.      Maylon Peppers, MD Gastroenterology and Hepatology [facility]

## 2021-09-07 LAB — TSH: TSH: 1.78 u[IU]/mL (ref 0.450–4.500)

## 2021-09-07 LAB — CELIAC DISEASE PANEL
Endomysial IgA: NEGATIVE
IgA/Immunoglobulin A, Serum: 123 mg/dL (ref 90–386)
Transglutaminase IgA: <2 U/mL (ref 0–3)

## 2021-09-13 ENCOUNTER — Telehealth (INDEPENDENT_AMBULATORY_CARE_PROVIDER_SITE_OTHER): Payer: Self-pay

## 2021-09-13 ENCOUNTER — Encounter (INDEPENDENT_AMBULATORY_CARE_PROVIDER_SITE_OTHER): Payer: Self-pay

## 2021-09-13 ENCOUNTER — Other Ambulatory Visit (INDEPENDENT_AMBULATORY_CARE_PROVIDER_SITE_OTHER): Payer: Self-pay

## 2021-09-13 NOTE — Telephone Encounter (Signed)
I called to scheduled Reginald [name] for his Tcs/Egd w/mac and spoke to patients girlfriend and she states that Reginald [name] is only going to do the Egd at this time declining the Tcs as I advised to do both at once but at this time only the Egd will be scheduled.

## 2021-09-13 NOTE — Telephone Encounter (Signed)
Reading, thanks. Please make him aware that a colonoscopy would be important to evaluate his symptoms as well. We have not received the stool test results yet

## 2021-10-03 ENCOUNTER — Encounter (INDEPENDENT_AMBULATORY_CARE_PROVIDER_SITE_OTHER): Payer: Self-pay

## 2021-10-23 ENCOUNTER — Encounter (INDEPENDENT_AMBULATORY_CARE_PROVIDER_SITE_OTHER): Payer: Self-pay

## 2021-10-23 ENCOUNTER — Other Ambulatory Visit (INDEPENDENT_AMBULATORY_CARE_PROVIDER_SITE_OTHER): Payer: Self-pay

## 2021-10-23 ENCOUNTER — Telehealth (INDEPENDENT_AMBULATORY_CARE_PROVIDER_SITE_OTHER): Payer: Self-pay

## 2021-10-23 MED ORDER — PEG 3350-KCL-NA BICARB-NACL 420 G PO SOLR: 4000.0000 mL | ORAL | 0 refills | Status: DC

## 2021-10-23 NOTE — Telephone Encounter (Signed)
Asanti Craigo Ann Violetta Lavalle, CMA  ?

## 2021-11-17 ENCOUNTER — Encounter (HOSPITAL_COMMUNITY)
Admission: RE | Disposition: A | Discharge status: Home / Self Care | Payer: Self-pay | Source: Home / Self Care | Attending: Gastroenterology

## 2021-11-17 ENCOUNTER — Ambulatory Visit (HOSPITAL_COMMUNITY)
Admission: RE | Admit: 2021-11-17 | Discharge: 2021-11-17 | Disposition: A | Discharge status: Home / Self Care | Payer: 59 | Attending: Gastroenterology | Admitting: Gastroenterology

## 2021-11-17 ENCOUNTER — Ambulatory Visit (HOSPITAL_BASED_OUTPATIENT_CLINIC_OR_DEPARTMENT_OTHER): Payer: 59 | Admitting: Anesthesiology

## 2021-11-17 ENCOUNTER — Ambulatory Visit (HOSPITAL_COMMUNITY): Payer: 59 | Admitting: Anesthesiology

## 2021-11-17 ENCOUNTER — Encounter (HOSPITAL_COMMUNITY): Payer: Self-pay | Admitting: Gastroenterology

## 2021-11-17 ENCOUNTER — Other Ambulatory Visit: Payer: Self-pay

## 2021-11-17 DIAGNOSIS — K267 Chronic duodenal ulcer without hemorrhage or perforation: Secondary | ICD-10-CM | POA: Diagnosis not present

## 2021-11-17 DIAGNOSIS — K2970 Gastritis, unspecified, without bleeding: Secondary | ICD-10-CM

## 2021-11-17 DIAGNOSIS — K3189 Other diseases of stomach and duodenum: Secondary | ICD-10-CM

## 2021-11-17 DIAGNOSIS — F17210 Nicotine dependence, cigarettes, uncomplicated: Secondary | ICD-10-CM | POA: Insufficient documentation

## 2021-11-17 DIAGNOSIS — D125 Benign neoplasm of sigmoid colon: Secondary | ICD-10-CM

## 2021-11-17 DIAGNOSIS — K5520 Angiodysplasia of colon without hemorrhage: Secondary | ICD-10-CM | POA: Insufficient documentation

## 2021-11-17 DIAGNOSIS — D123 Benign neoplasm of transverse colon: Secondary | ICD-10-CM | POA: Diagnosis not present

## 2021-11-17 DIAGNOSIS — K2980 Duodenitis without bleeding: Secondary | ICD-10-CM | POA: Diagnosis not present

## 2021-11-17 DIAGNOSIS — R197 Diarrhea, unspecified: Secondary | ICD-10-CM | POA: Diagnosis not present

## 2021-11-17 DIAGNOSIS — K635 Polyp of colon: Secondary | ICD-10-CM | POA: Diagnosis not present

## 2021-11-17 DIAGNOSIS — K257 Chronic gastric ulcer without hemorrhage or perforation: Secondary | ICD-10-CM | POA: Insufficient documentation

## 2021-11-17 DIAGNOSIS — R109 Unspecified abdominal pain: Secondary | ICD-10-CM | POA: Insufficient documentation

## 2021-11-17 DIAGNOSIS — K5730 Diverticulosis of large intestine without perforation or abscess without bleeding: Secondary | ICD-10-CM | POA: Diagnosis not present

## 2021-11-17 DIAGNOSIS — R112 Nausea with vomiting, unspecified: Secondary | ICD-10-CM

## 2021-11-17 HISTORY — PX: COLONOSCOPY WITH PROPOFOL: SHX5780

## 2021-11-17 HISTORY — PX: ESOPHAGOGASTRODUODENOSCOPY (EGD) WITH PROPOFOL: SHX5813

## 2021-11-17 HISTORY — PX: POLYPECTOMY: SHX5525

## 2021-11-17 HISTORY — PX: BIOPSY: SHX5522

## 2021-11-17 LAB — HM COLONOSCOPY

## 2021-11-17 SURGERY — COLONOSCOPY WITH PROPOFOL
Anesthesia: General

## 2021-11-17 MED ORDER — LIDOCAINE HCL (CARDIAC) PF 100 MG/5ML IV SOSY
PREFILLED_SYRINGE | INTRAVENOUS | Status: DC | PRN
  Administered 2021-11-17: 50 mg via INTRAVENOUS

## 2021-11-17 MED ORDER — LACTATED RINGERS IV SOLN: INTRAVENOUS | Status: DC

## 2021-11-17 MED ORDER — PROPOFOL 500 MG/50ML IV EMUL
INTRAVENOUS | Status: DC | PRN
  Administered 2021-11-17: 150 ug/kg/min via INTRAVENOUS

## 2021-11-17 MED ORDER — OMEPRAZOLE 40 MG PO CPDR: 40.0000 mg | DELAYED_RELEASE_CAPSULE | Freq: Every day | ORAL | 3 refills | Status: DC

## 2021-11-17 MED ORDER — PROPOFOL 500 MG/50ML IV EMUL
INTRAVENOUS | Status: AC
  Filled 2021-11-17: qty 50

## 2021-11-17 MED ORDER — LIDOCAINE HCL (PF) 2 % IJ SOLN
INTRAMUSCULAR | Status: AC
  Filled 2021-11-17: qty 5

## 2021-11-17 NOTE — Anesthesia Postprocedure Evaluation (Signed)
Anesthesia Post Note  Patient: Reginald [name]  Procedure(s) Performed: COLONOSCOPY WITH PROPOFOL ESOPHAGOGASTRODUODENOSCOPY (EGD) WITH PROPOFOL BIOPSY POLYPECTOMY  Patient location during evaluation: Phase II Anesthesia Type: General Level of consciousness: awake Pain management: pain level controlled Vital Signs Assessment: post-procedure vital signs reviewed and stable Respiratory status: spontaneous breathing and respiratory function stable Cardiovascular status: blood pressure returned to baseline and stable Postop Assessment: no headache and no apparent nausea or vomiting Anesthetic complications: no Comments: Late entry   There were no known notable events for this encounter.   Last Vitals:  Vitals:   11/17/21 1128 11/17/21 1338  BP: (!) 155/101 123/88  Pulse: 70 79  Resp: 12 19  Temp: 36.6 C (!) 36.4 C  SpO2: 99% 99%    Last Pain:  Vitals:   11/17/21 1338  TempSrc:   PainSc: 0-No pain                 Louann Sjogren

## 2021-11-17 NOTE — H&P (Signed)
Reginald [name] is an 56 y.o. male.   Chief Complaint: Abdominal pain, nausea, vomiting and diarrhea HPI: Reginald [name] is a 56 y.o. male with past medical history of arthritis and migraines, who presents for evaluation of nausea, abdominal pain, vomiting and diarrhea.   Patient reports having intermittent episodes of abdominal pain with some vomiting occasionally.  Still having some occasional episode of diarrhea.  Had negative celiac disease panel and TSH was normal.  Did not perform stool testing.  Past Medical History:  Diagnosis Date   Arthritis    Headache    migraines     Past Surgical History:  Procedure Laterality Date   INGUINAL HERNIA REPAIR Right 06/02/2015   Procedure: RIGHT INGUINAL HERNIA REPAIR WITH MESH;  Surgeon: Greer Pickerel, MD;  Location: [place];  Service: General;  Laterality: Right;   INSERTION OF MESH Right 06/02/2015   Procedure: INSERTION OF MESH;  Surgeon: Greer Pickerel, MD;  Location: [place];  Service: General;  Laterality: Right;   NO PAST SURGERIES      Family History  Problem Relation Age of Onset   Diabetes Mother    Osteoarthritis Mother    Heart disease Father    Social History:  reports that he has been smoking cigarettes. He has been smoking an average of 1.5 packs per day. He has quit using smokeless tobacco. He reports that he does not drink alcohol and does not use drugs.  Allergies:  Allergies  Allergen Reactions   Penicillins Rash    Has patient had a PCN reaction causing immediate rash, facial/tongue/throat swelling, SOB or lightheadedness with hypotension: Yes Has patient had a PCN reaction causing severe rash involving mucus membranes or skin necrosis: No Has patient had a PCN reaction that required hospitalization No Has patient had a PCN reaction occurring within the last 10 years: No If all of the above answers are "NO", then may proceed with Cephalosporin use.      Medications Prior to Admission  Medication Sig Dispense Refill    dicyclomine (BENTYL) 10 MG capsule Take 1 capsule (10 mg total) by mouth every 12 (twelve) hours as needed (abdominal pain). 60 capsule 2   ondansetron (ZOFRAN) 4 MG tablet Take 1 tablet (4 mg total) by mouth every 8 (eight) hours as needed for nausea or vomiting. 30 tablet 1   polyethylene glycol-electrolytes (TRILYTE) 420 g solution Take 4,000 mLs by mouth as directed. 4000 mL 0    No results found for this or any previous visit (from the past 48 hour(s)). No results found.  Review of Systems  Constitutional: Negative.   HENT: Negative.    Eyes: Negative.   Respiratory: Negative.    Cardiovascular: Negative.   Gastrointestinal:  Positive for abdominal pain, diarrhea, nausea and vomiting.  Endocrine: Negative.   Genitourinary: Negative.   Musculoskeletal: Negative.   Skin: Negative.   Allergic/Immunologic: Negative.   Neurological: Negative.   Hematological: Negative.   Psychiatric/Behavioral: Negative.      Blood pressure (!) 155/101, pulse 70, temperature 97.8 F (36.6 C), temperature source Oral, resp. rate 12, height '5\' 8"'$  (1.727 m), weight 63.5 kg, SpO2 99 %. Physical Exam  GENERAL: The patient is AO x3, in no acute distress. HEENT: Head is normocephalic and atraumatic. EOMI are intact. Mouth is well hydrated and without lesions. NECK: Supple. No masses LUNGS: Clear to auscultation. No presence of rhonchi/wheezing/rales. Adequate chest expansion HEART: RRR, normal s1 and s2. ABDOMEN: Soft, nontender, no guarding, no peritoneal signs, and nondistended.  BS +. No masses. EXTREMITIES: Without any cyanosis, clubbing, rash, lesions or edema. NEUROLOGIC: AOx3, no focal motor deficit. SKIN: no jaundice, no rashes  Assessment/Plan Reginald [name] is a 56 y.o. male with past medical history of arthritis and migraines, who presents for evaluation of nausea, abdominal pain, vomiting and diarrhea.  We will proceed with EGD and colonoscopy.  Harvel Quale, MD 11/17/2021,  12:21 PM

## 2021-11-17 NOTE — Transfer of Care (Signed)
Immediate Anesthesia Transfer of Care Note  Patient: Reginald [name]  Procedure(s) Performed: COLONOSCOPY WITH PROPOFOL ESOPHAGOGASTRODUODENOSCOPY (EGD) WITH PROPOFOL BIOPSY POLYPECTOMY  Patient Location: PACU and Endoscopy Unit  Anesthesia Type:General  Level of Consciousness: awake  Airway & Oxygen Therapy: Patient Spontanous Breathing  Post-op Assessment: Report given to RN  Post vital signs: Reviewed  Last Vitals:  Vitals Value Taken Time  BP 123/88 11/17/21 1338  Temp 36.4 C 11/17/21 1338  Pulse 79 11/17/21 1338  Resp 19 11/17/21 1338  SpO2 99 % 11/17/21 1338    Last Pain:  Vitals:   11/17/21 1338  TempSrc:   PainSc: 0-No pain         Complications: There were no known notable events for this encounter.

## 2021-11-17 NOTE — Op Note (Signed)
[facility] Patient Name: Reginald [name] Procedure Date: 11/17/2021 12:39 PM MRN: [id number] Date of Birth: 1965-10-07 Attending MD: Maylon Peppers , , [id number] CSN: [id number] Age: 56 Admit Type: Outpatient Procedure:                Colonoscopy Indications:              Clinically significant diarrhea of unexplained                            origin Providers:                Maylon Peppers, Rosina Lowenstein, RN, Casimer Bilis, Technician Referring MD:              Medicines:                Monitored Anesthesia Care Complications:            No immediate complications. Estimated Blood Loss:     Estimated blood loss: none. Procedure:                Pre-Anesthesia Assessment:                           - Prior to the procedure, a History and Physical                            was performed, and patient medications, allergies                            and sensitivities were reviewed. The patient's                            tolerance of previous anesthesia was reviewed.                           - The risks and benefits of the procedure and the                            sedation options and risks were discussed with the                            patient. All questions were answered and informed                            consent was obtained.                           - ASA Grade Assessment: II - A patient with mild                            systemic disease.                           After obtaining informed consent, the colonoscope  was passed under direct vision. Throughout the                            procedure, the patient's blood pressure, pulse, and                            oxygen saturations were monitored continuously. The                            [id number]-HQ190L ([id number]) scope was introduced through                            the anus and advanced to the the cecum, identified                            by  appendiceal orifice and ileocecal valve. The                            colonoscopy was performed without difficulty. The                            patient tolerated the procedure well. The quality                            of the bowel preparation was good. Scope In: 1:02:03 PM Scope Out: 1:30:34 PM Scope Withdrawal Time: 0 hours 23 minutes 26 seconds  Total Procedure Duration: 0 hours 28 minutes 31 seconds  Findings:      The perianal and digital rectal examinations were normal.      Three sessile polyps were found in the sigmoid colon and transverse       colon. The polyps were 3 to 6 mm in size. These polyps were removed with       a cold snare. Resection and retrieval were complete.      A 15 mm polypoid lesion was found in the sigmoid colon. The lesion was       pedunculated. No bleeding was present. Unclear if this is an adenoma or       an inverted diverticula. Imaging was performed using white light and       narrow band imaging to visualize the mucosa. This was biopsied with a       cold forceps for histology.      Scattered medium-mouthed diverticula were found in the sigmoid colon and       descending colon. Biopsies for histology were taken from normal colon       with a cold forceps from the right colon and left colon for evaluation       of microscopic colitis.      The retroflexed view of the distal rectum and anal verge was normal and       showed no anal or rectal abnormalities. Impression:               - Three 3 to 6 mm polyps in the sigmoid colon and                            in the transverse colon, removed  with a cold snare.                            Resected and retrieved.                           - Polypoid lesion in the sigmoid colon. Biopsied.                           - Diverticulosis in the sigmoid colon and in the                            descending colon. Biopsied.                           - The distal rectum and anal verge are normal on                             retroflexion view. Moderate Sedation:      Per Anesthesia Care Recommendation:           - Discharge patient to home (ambulatory).                           - Resume previous diet.                           - Await pathology results.                           - Repeat colonoscopy for surveillance based on                            pathology results. Procedure Code(s):        --- Professional ---                           [phone], Colonoscopy, flexible; with removal of                            tumor(s), polyp(s), or other lesion(s) by snare                            technique                           [id number], 65, Colonoscopy, flexible; with biopsy,                            single or multiple Diagnosis Code(s):        --- Professional ---                           D12.5, Benign neoplasm of sigmoid colon                           D12.3, Benign neoplasm of transverse colon (hepatic  flexure or splenic flexure)                           D49.0, Neoplasm of unspecified behavior of                            digestive system                           R19.7, Diarrhea, unspecified                           K57.30, Diverticulosis of large intestine without                            perforation or abscess without bleeding CPT copyright 2022 American Medical Association. All rights reserved. The codes documented in this report are preliminary and upon coder review may  be revised to meet current compliance requirements. Maylon Peppers, MD Maylon Peppers,  11/17/2021 1:47:40 PM This report has been signed electronically. Number of Addenda: 0

## 2021-11-17 NOTE — Anesthesia Preprocedure Evaluation (Signed)
Anesthesia Evaluation  Patient identified by MRN, date of birth, ID band Patient awake    Reviewed: Allergy & Precautions, H&P , NPO status , Patient's Chart, lab work & pertinent test results, reviewed documented beta blocker date and time   Airway Mallampati: II  TM Distance: >3 FB Neck ROM: full    Dental no notable dental hx.    Pulmonary neg pulmonary ROS, Current Smoker,    Pulmonary exam normal breath sounds clear to auscultation       Cardiovascular Exercise Tolerance: Good negative cardio ROS   Rhythm:regular Rate:Normal     Neuro/Psych  Headaches, negative psych ROS   GI/Hepatic negative GI ROS, Neg liver ROS,   Endo/Other  negative endocrine ROS  Renal/GU negative Renal ROS  negative genitourinary   Musculoskeletal   Abdominal   Peds  Hematology negative hematology ROS (+)   Anesthesia Other Findings   Reproductive/Obstetrics negative OB ROS                             Anesthesia Physical Anesthesia Plan  ASA: 2  Anesthesia Plan: General   Post-op Pain Management:    Induction:   PONV Risk Score and Plan: Propofol infusion  Airway Management Planned:   Additional Equipment:   Intra-op Plan:   Post-operative Plan:   Informed Consent: I have reviewed the patients History and Physical, chart, labs and discussed the procedure including the risks, benefits and alternatives for the proposed anesthesia with the patient or authorized representative who has indicated his/her understanding and acceptance.     Dental Advisory Given  Plan Discussed with: CRNA  Anesthesia Plan Comments:         Anesthesia Quick Evaluation

## 2021-11-17 NOTE — Discharge Instructions (Signed)
You are being discharged to home.  Resume your previous diet.  We are waiting for your pathology results.  Take Prilosec (omeprazole) 40 mg by mouth once a day.  Do not take any ibuprofen (including Advil, Motrin or Nuprin), naproxen, or other non-steroidal anti-inflammatory drugs.  Your physician has recommended a repeat colonoscopy for surveillance based on pathology results.

## 2021-11-17 NOTE — Op Note (Signed)
[facility] Patient Name: Reginald [name] Procedure Date: 11/17/2021 12:39 PM MRN: [id number] Date of Birth: 09/16/1965 Attending MD: Maylon Peppers , , [id number] CSN: [id number] Age: 56 Admit Type: Outpatient Procedure:                Upper GI endoscopy Indications:              Abdominal pain, Nausea with vomiting Providers:                Maylon Peppers, Rosina Lowenstein, RN, Casimer Bilis, Technician Referring MD:              Medicines:                Monitored Anesthesia Care Complications:            No immediate complications. Estimated Blood Loss:     Estimated blood loss: none. Procedure:                Pre-Anesthesia Assessment:                           - Prior to the procedure, a History and Physical                            was performed, and patient medications, allergies                            and sensitivities were reviewed. The patient's                            tolerance of previous anesthesia was reviewed.                           - The risks and benefits of the procedure and the                            sedation options and risks were discussed with the                            patient. All questions were answered and informed                            consent was obtained.                           - ASA Grade Assessment: II - A patient with mild                            systemic disease.                           After obtaining informed consent, the endoscope was                            passed under direct vision. Throughout the  procedure, the patient's blood pressure, pulse, and                            oxygen saturations were monitored continuously. The                            [id number]-H190 ([id number]) scope was introduced through the                            mouth, and advanced to the second part of duodenum.                            The upper GI endoscopy was accomplished  without                            difficulty. The patient tolerated the procedure                            well. Scope In: 12:49:10 PM Scope Out: 12:57:53 PM Total Procedure Duration: 0 hours 8 minutes 43 seconds  Findings:      The examined esophagus was normal.      Multiple linear localized medium erosions with no stigmata of recent       bleeding were found in the gastric body. Biopsies were taken with a cold       forceps for Helicobacter pylori testing.      Patchy mild inflammation characterized by congestion (edema) and       erythema was found in the first portion of the duodenum. Biopsies were       taken with a cold forceps for histology. Impression:               - Normal esophagus.                           - Erosive gastropathy with no stigmata of recent                            bleeding. Biopsied.                           - Duodenitis. Biopsied. Moderate Sedation:      Per Anesthesia Care Recommendation:           - Discharge patient to home (ambulatory).                           - Resume previous diet.                           - Await pathology results.                           - Use Prilosec (omeprazole) 40 mg PO daily.                           - No ibuprofen, naproxen, or other non-steroidal  anti-inflammatory drugs. Procedure Code(s):        --- Professional ---                           [phone], Esophagogastroduodenoscopy, flexible,                            transoral; with biopsy, single or multiple Diagnosis Code(s):        --- Professional ---                           K31.89, Other diseases of stomach and duodenum                           K29.80, Duodenitis without bleeding                           R10.9, Unspecified abdominal pain                           R11.2, Nausea with vomiting, unspecified CPT copyright 2022 American Medical Association. All rights reserved. The codes documented in this report are preliminary and  upon coder review may  be revised to meet current compliance requirements. Maylon Peppers, MD Maylon Peppers,  11/17/2021 1:40:05 PM This report has been signed electronically. Number of Addenda: 0

## 2021-11-20 ENCOUNTER — Encounter (INDEPENDENT_AMBULATORY_CARE_PROVIDER_SITE_OTHER): Payer: Self-pay | Admitting: *Deleted

## 2021-11-22 ENCOUNTER — Other Ambulatory Visit (INDEPENDENT_AMBULATORY_CARE_PROVIDER_SITE_OTHER): Payer: Self-pay | Admitting: Gastroenterology

## 2021-11-22 ENCOUNTER — Encounter (HOSPITAL_COMMUNITY): Payer: Self-pay | Admitting: Gastroenterology

## 2021-11-22 DIAGNOSIS — B9681 Helicobacter pylori [H. pylori] as the cause of diseases classified elsewhere: Secondary | ICD-10-CM

## 2021-11-22 LAB — SURGICAL PATHOLOGY

## 2021-11-22 MED ORDER — METRONIDAZOLE 500 MG PO TABS: 500.0000 mg | ORAL_TABLET | Freq: Three times a day (TID) | ORAL | 0 refills | Status: AC

## 2021-11-22 MED ORDER — BISMUTH 262 MG PO CHEW: 2.0000 | CHEWABLE_TABLET | Freq: Four times a day (QID) | ORAL | 0 refills | Status: DC

## 2021-11-22 MED ORDER — TETRACYCLINE HCL 500 MG PO CAPS: 500.0000 mg | ORAL_CAPSULE | Freq: Four times a day (QID) | ORAL | 0 refills | Status: AC

## 2021-12-04 ENCOUNTER — Other Ambulatory Visit (INDEPENDENT_AMBULATORY_CARE_PROVIDER_SITE_OTHER): Payer: Self-pay | Admitting: Gastroenterology

## 2021-12-04 DIAGNOSIS — R1010 Upper abdominal pain, unspecified: Secondary | ICD-10-CM

## 2021-12-07 ENCOUNTER — Ambulatory Visit (INDEPENDENT_AMBULATORY_CARE_PROVIDER_SITE_OTHER): Payer: 59 | Admitting: Gastroenterology

## 2021-12-07 ENCOUNTER — Encounter (INDEPENDENT_AMBULATORY_CARE_PROVIDER_SITE_OTHER): Payer: Self-pay | Admitting: Gastroenterology

## 2021-12-07 VITALS — BP 133/84 | HR 93 | Temp 98.7°F | Ht 68.0 in | Wt 131.6 lb

## 2021-12-07 DIAGNOSIS — B9681 Helicobacter pylori [H. pylori] as the cause of diseases classified elsewhere: Secondary | ICD-10-CM | POA: Diagnosis not present

## 2021-12-07 DIAGNOSIS — K529 Noninfective gastroenteritis and colitis, unspecified: Secondary | ICD-10-CM | POA: Diagnosis not present

## 2021-12-07 DIAGNOSIS — K2970 Gastritis, unspecified, without bleeding: Secondary | ICD-10-CM

## 2021-12-07 NOTE — Progress Notes (Signed)
Reginald [name], M.D. Gastroenterology & Hepatology [facility] [address] [place],  [address]  Primary Care Physician: Patient, No Pcp Per No address on file  I will communicate my assessment and recommendations to the referring MD via EMR.  Problems: H. Pylori gastroduodenitis Chronic diarrhea.  History of Present Illness: Reginald [name] is a 56 y.o. male with past medical history of H. Pylori gastroduodenitis, arthritis and migraines, who presents for follow up of abdominal pain, nausea, diarrhea and vomiting.  The patient was last seen on 09/04/2021. At that time, the patient was scheduled for an EGD and colonoscopy with finding described below.  Had negative celiac disease panel and TSH.  Was ordered fecal fat and elastase but he never performed this test.  He was given a prescription for Bentyl and Zofran as needed.  In his most recent esophagogastroduodenospy he was found to have H. pylori, prescribed bismuth quadruple therapy.  Patient reports that he took the medications as compliant as possible, although he reports that given his schedule he could not take the tetracycline every 6 hours but every 8 hours. He finished his medications yesterday.  States right now his abdominal pain is very mild and occurs infrequently. He has not had any nausea or vomiting. However, he reports still having some episodes of fecal urgency possibly 3 times a week - however reports his stool is more formed and is way less frequent than what it was. No fecal soiling.  The patient denies having any nausea, vomiting, fever, chills, hematochezia, melena, hematemesis, abdominal distention, jaundice, pruritus or weight loss, although he would like to weigh more than he does - has good appetite.  Last EGD: 11/17/2021 - Normal esophagus. - Erosive gastropathy with no stigmata of recent bleeding. Biopsied. - Duodenitis. Biopsied.  Last Colonoscopy:  11/17/2021 - Three 3 to 6 mm polyps in the sigmoid colon and in the transverse colon, removed with a cold snare. Resected and retrieved. - Polypoid lesion in the sigmoid colon. Biopsied. - Diverticulosis in the sigmoid colon and in the descending colon. Biopsied. - The distal rectum and anal verge are normal on retroflexion view.  Path: A. SMALL BOWEL, BIOPSY: Chronic duodenitis with superficial mucosal erosions and reparative changes in the crypts. There are no diagnostic features of celiac disease. Negative for dysplasia and malignancy.  B. STOMACH, BIOPSY: Chronic superficially erosive gastritis. Results of immunostain for H. pylori will be reported as an addendum. Intestinal metaplasia, atrophy and dysplasia are not identified  C. 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 Negative for dysplasia.  D. COLON, RANDOM, BIOPSY: Mild nonspecific inflammation consistent with prep related changes. There are no diagnostic features of inflammatory bowel disease, microscopic colitis and collagenous colitis.  E. COLON, TRANSVERSE, POLYPECTOMY: Hyperplastic polyp. Negative for dysplasia.  F. 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 Negative for dysplasia.   Recommended repeat colonoscopy in 10 years.  Past Medical History: Past Medical History:  Diagnosis Date   Arthritis    Headache    migraines     Past Surgical History: Past Surgical History:  Procedure Laterality Date   BIOPSY  11/17/2021   Procedure: BIOPSY;  Surgeon: Harvel Quale, MD;  [facility];  Service: Gastroenterology;;   COLONOSCOPY WITH PROPOFOL N/A 11/17/2021   Procedure: COLONOSCOPY WITH PROPOFOL;  Surgeon: Harvel Quale, MD;  [facility];  Service: Gastroenterology;  Laterality: N/A;  200 ASA 2  ESOPHAGOGASTRODUODENOSCOPY (EGD) WITH PROPOFOL N/A  11/17/2021   Procedure: ESOPHAGOGASTRODUODENOSCOPY (EGD) WITH PROPOFOL;  Surgeon: Harvel Quale, MD;  [facility];  Service: Gastroenterology;  Laterality: N/A;   INGUINAL HERNIA REPAIR Right 06/02/2015   Procedure: RIGHT INGUINAL HERNIA REPAIR WITH MESH;  Surgeon: Greer Pickerel, MD;  Location: [place];  Service: General;  Laterality: Right;   INSERTION OF MESH Right 06/02/2015   Procedure: INSERTION OF MESH;  Surgeon: Greer Pickerel, MD;  Location: [place];  Service: General;  Laterality: Right;   NO PAST SURGERIES     POLYPECTOMY  11/17/2021   Procedure: POLYPECTOMY;  Surgeon: Harvel Quale, MD;  [facility];  Service: Gastroenterology;;    Family History: Family History  Problem Relation Age of Onset   Diabetes Mother    Osteoarthritis Mother    Heart disease Father     Social History: Social History   Tobacco Use  Smoking Status Heavy Smoker   Packs/day: 1.50   Types: Cigarettes  Smokeless Tobacco Former   Social History   Substance and Sexual Activity  Alcohol Use No   Alcohol/week: 0.0 standard drinks of alcohol   Comment: formerly a heavy drinker, quit 2002   Social History   Substance and Sexual Activity  Drug Use No    Allergies: Allergies  Allergen Reactions   Penicillins Rash    Has patient had a PCN reaction causing immediate rash, facial/tongue/throat swelling, SOB or lightheadedness with hypotension: Yes Has patient had a PCN reaction causing severe rash involving mucus membranes or skin necrosis: No Has patient had a PCN reaction that required hospitalization No Has patient had a PCN reaction occurring within the last 10 years: No If all of the above answers are "NO", then may proceed with Cephalosporin use.      Medications: Current Outpatient Medications  Medication Sig Dispense Refill   dicyclomine (BENTYL) 10 MG capsule TAKE 1 CAPSULE (10 MG TOTAL) BY MOUTH EVERY 12 (TWELVE) HOURS AS NEEDED (ABDOMINAL  PAIN). 60 capsule 2   omeprazole (PRILOSEC) 40 MG capsule Take 1 capsule (40 mg total) by mouth daily. 90 capsule 3   ondansetron (ZOFRAN) 4 MG tablet Take 1 tablet (4 mg total) by mouth every 8 (eight) hours as needed for nausea or vomiting. 30 tablet 1   Bismuth 262 MG CHEW Chew 2 each by mouth every 6 (six) hours. 112 tablet 0   No current facility-administered medications for this visit.    Review of Systems: GENERAL: negative for malaise, night sweats HEENT: No changes in hearing or vision, no nose bleeds or other nasal problems. NECK: Negative for lumps, goiter, pain and significant neck swelling RESPIRATORY: Negative for cough, wheezing CARDIOVASCULAR: Negative for chest pain, leg swelling, palpitations, orthopnea GI: SEE HPI MUSCULOSKELETAL: Negative for joint pain or swelling, back pain, and muscle pain. SKIN: Negative for lesions, rash PSYCH: Negative for sleep disturbance, mood disorder and recent psychosocial stressors. HEMATOLOGY Negative for prolonged bleeding, bruising easily, and swollen nodes. ENDOCRINE: Negative for cold or heat intolerance, polyuria, polydipsia and goiter. NEURO: negative for tremor, gait imbalance, syncope and seizures. The remainder of the review of systems is noncontributory.   Physical Exam: BP 133/84 (BP Location: Right Arm, Patient Position: Sitting, Cuff Size: Small)   Pulse 93   Temp 98.7 F (37.1 C) (Temporal)   Ht '5\' 8"'$  (1.727 m)   Wt 131 lb 9.6 oz (59.7 kg)   BMI 20.01 kg/m  GENERAL: The patient is AO x3, in no  acute distress. HEENT: Head is normocephalic and atraumatic. EOMI are intact. Mouth is well hydrated and without lesions. NECK: Supple. No masses LUNGS: Clear to auscultation. No presence of rhonchi/wheezing/rales. Adequate chest expansion HEART: RRR, normal s1 and s2. ABDOMEN: Soft, nontender, no guarding, no peritoneal signs, and nondistended. BS +. No masses. EXTREMITIES: Without any cyanosis, clubbing, rash, lesions or  edema. NEUROLOGIC: AOx3, no focal motor deficit. SKIN: no jaundice, no rashes  Imaging/Labs: as above  I personally reviewed and interpreted the available labs, imaging and endoscopic files.  Impression and Plan: TRAYE BATES is a 56 y.o. male with past medical history of H. Pylori gastroduodenitis, arthritis and migraines, who presents for follow up of abdominal pain, nausea, diarrhea and vomiting. His symptoms have significantly improved after receiving bismuth quadruple therapy. Unfortunately he did not take tetracycline as she should have so it remains to be seen if he actually cleared the infection - will need to perform a breath test in 2 weeks.   Stool are improving but can take a stool bulking agent for now, however if symptoms persist can try Imodium PRN.  - Perform H. Pylori breath test in 2 weeks - Start Benefiber fiber supplements daily to increase stool bulk - If no improvement in bowel movements with Benefiber, try Imodium as needed  All questions were answered.      Reginald Peppers, MD Gastroenterology and Hepatology [facility]

## 2021-12-07 NOTE — Patient Instructions (Addendum)
Perform H. Pylori breath test in 2 weeks Start Benefiber fiber supplements daily to increase stool bulk If no improvement in bowel movements with Benefiber, try Imodium as needed Can take protein shakes 2-3 times a day

## 2021-12-21 ENCOUNTER — Other Ambulatory Visit (INDEPENDENT_AMBULATORY_CARE_PROVIDER_SITE_OTHER): Payer: Self-pay | Admitting: Gastroenterology

## 2021-12-21 ENCOUNTER — Encounter (INDEPENDENT_AMBULATORY_CARE_PROVIDER_SITE_OTHER): Payer: Self-pay | Admitting: Gastroenterology

## 2021-12-21 DIAGNOSIS — R112 Nausea with vomiting, unspecified: Secondary | ICD-10-CM

## 2021-12-21 DIAGNOSIS — R1010 Upper abdominal pain, unspecified: Secondary | ICD-10-CM

## 2021-12-21 DIAGNOSIS — K2970 Gastritis, unspecified, without bleeding: Secondary | ICD-10-CM

## 2021-12-21 MED ORDER — ONDANSETRON HCL 4 MG PO TABS: 4.0000 mg | ORAL_TABLET | Freq: Three times a day (TID) | ORAL | 3 refills | Status: DC | PRN

## 2021-12-21 MED ORDER — OMEPRAZOLE 40 MG PO CPDR: 40.0000 mg | DELAYED_RELEASE_CAPSULE | Freq: Every day | ORAL | 3 refills | Status: DC

## 2021-12-21 MED ORDER — DICYCLOMINE HCL 10 MG PO CAPS: 10.0000 mg | ORAL_CAPSULE | Freq: Two times a day (BID) | ORAL | 3 refills | Status: DC | PRN

## 2021-12-21 NOTE — Telephone Encounter (Signed)
Should he resume Omeprazole 40 mg once per day ? He preformed the H pylori test today. If needs to resume the omeprazole will need to send into drug store. Please advise.

## 2021-12-21 NOTE — Telephone Encounter (Signed)
I sent refills , did his symptoms worsen when he ran out of the medications? If medicines are not helping and not able to keep hydrated may need to go to the ER, but should try these medicines first.

## 2021-12-21 NOTE — Telephone Encounter (Signed)
Patient spouse called the office she says patient has had nausea, vomiting and diarrhea off and on for a while. Patient has vomited this morning 10-15 times, has stomach cramping, had one episode of diarrhea this am. He is not running a temp, has not seen any blood in vomitus or stools, He says he is able to drink,but not able to keep any food on his stomach. He is going today for a repeat on the H pylori. Patient is not taking any medications as of right now,says he ran out of dicyclomine, and ondansetron. They use [facility]. We have no openings until next Monday. Please advise.

## 2021-12-27 LAB — H. PYLORI BREATH TEST: H. pylori Breath Test: NOT DETECTED

## 2021-12-28 ENCOUNTER — Encounter (INDEPENDENT_AMBULATORY_CARE_PROVIDER_SITE_OTHER): Payer: Self-pay | Admitting: Gastroenterology

## 2021-12-28 NOTE — Telephone Encounter (Signed)
Called to get more information. Patient is better but still having abdominal cramping, nasuea, and frequent stools in the mornings. Has 4 -5 stools every morning. The cramping comes and goes at different times and not every day. Last a few minutes. Sometimes after eating and sometimes when he doesn't eat. He takes dicyclomine bid, zofran bid, pepto tid and omeprazole '20mg'$  tid.

## 2021-12-28 NOTE — Telephone Encounter (Signed)
Correction omeprazole '40mg'$  one tid (not '20mg'$ )

## 2021-12-29 ENCOUNTER — Other Ambulatory Visit: Payer: Self-pay | Admitting: Gastroenterology

## 2021-12-29 DIAGNOSIS — K529 Noninfective gastroenteritis and colitis, unspecified: Secondary | ICD-10-CM

## 2021-12-29 MED ORDER — DIPHENOXYLATE-ATROPINE 2.5-0.025 MG PO TABS: 1.0000 | ORAL_TABLET | Freq: Four times a day (QID) | ORAL | 0 refills | Status: DC | PRN

## 2022-01-08 ENCOUNTER — Other Ambulatory Visit (INDEPENDENT_AMBULATORY_CARE_PROVIDER_SITE_OTHER): Payer: Self-pay | Admitting: Gastroenterology

## 2022-01-08 DIAGNOSIS — B9681 Helicobacter pylori [H. pylori] as the cause of diseases classified elsewhere: Secondary | ICD-10-CM

## 2022-01-19 ENCOUNTER — Encounter (INDEPENDENT_AMBULATORY_CARE_PROVIDER_SITE_OTHER): Payer: Self-pay | Admitting: Gastroenterology

## 2022-01-24 ENCOUNTER — Other Ambulatory Visit (INDEPENDENT_AMBULATORY_CARE_PROVIDER_SITE_OTHER): Payer: Self-pay | Admitting: Gastroenterology

## 2022-01-24 DIAGNOSIS — K2970 Gastritis, unspecified, without bleeding: Secondary | ICD-10-CM

## 2022-01-24 MED ORDER — OMEPRAZOLE 40 MG PO CPDR: 40.0000 mg | DELAYED_RELEASE_CAPSULE | Freq: Two times a day (BID) | ORAL | 1 refills | Status: DC

## 2022-02-03 ENCOUNTER — Other Ambulatory Visit: Payer: Self-pay | Admitting: Gastroenterology

## 2022-02-03 DIAGNOSIS — K529 Noninfective gastroenteritis and colitis, unspecified: Secondary | ICD-10-CM

## 2022-02-05 MED ORDER — DIPHENOXYLATE-ATROPINE 2.5-0.025 MG PO TABS: 1.0000 | ORAL_TABLET | Freq: Four times a day (QID) | ORAL | 0 refills | Status: DC | PRN

## 2022-02-19 ENCOUNTER — Other Ambulatory Visit: Payer: Self-pay | Admitting: Gastroenterology

## 2022-02-19 DIAGNOSIS — K529 Noninfective gastroenteritis and colitis, unspecified: Secondary | ICD-10-CM

## 2022-02-19 NOTE — Telephone Encounter (Signed)
Medication refilled two weeks ago, no new refills are indicted yet

## 2022-04-12 ENCOUNTER — Encounter (INDEPENDENT_AMBULATORY_CARE_PROVIDER_SITE_OTHER): Payer: Self-pay | Admitting: Gastroenterology

## 2022-04-24 ENCOUNTER — Other Ambulatory Visit (INDEPENDENT_AMBULATORY_CARE_PROVIDER_SITE_OTHER): Payer: Self-pay | Admitting: Gastroenterology

## 2022-04-24 DIAGNOSIS — K2970 Gastritis, unspecified, without bleeding: Secondary | ICD-10-CM

## 2022-05-22 ENCOUNTER — Other Ambulatory Visit: Payer: Self-pay | Admitting: Gastroenterology

## 2022-05-22 DIAGNOSIS — K529 Noninfective gastroenteritis and colitis, unspecified: Secondary | ICD-10-CM

## 2022-05-23 ENCOUNTER — Encounter (INDEPENDENT_AMBULATORY_CARE_PROVIDER_SITE_OTHER): Payer: Self-pay | Admitting: Gastroenterology

## 2022-06-07 ENCOUNTER — Ambulatory Visit (INDEPENDENT_AMBULATORY_CARE_PROVIDER_SITE_OTHER): Payer: 59 | Admitting: Gastroenterology

## 2022-06-20 ENCOUNTER — Other Ambulatory Visit (INDEPENDENT_AMBULATORY_CARE_PROVIDER_SITE_OTHER): Payer: Self-pay | Admitting: Gastroenterology

## 2022-06-20 DIAGNOSIS — R1010 Upper abdominal pain, unspecified: Secondary | ICD-10-CM

## 2022-08-06 ENCOUNTER — Ambulatory Visit (INDEPENDENT_AMBULATORY_CARE_PROVIDER_SITE_OTHER): Payer: 59 | Admitting: Gastroenterology

## 2022-08-23 ENCOUNTER — Ambulatory Visit (INDEPENDENT_AMBULATORY_CARE_PROVIDER_SITE_OTHER): Payer: 59 | Admitting: Gastroenterology

## 2022-09-03 ENCOUNTER — Other Ambulatory Visit: Payer: Self-pay | Admitting: Gastroenterology

## 2022-09-03 DIAGNOSIS — K529 Noninfective gastroenteritis and colitis, unspecified: Secondary | ICD-10-CM

## 2022-09-10 ENCOUNTER — Ambulatory Visit (INDEPENDENT_AMBULATORY_CARE_PROVIDER_SITE_OTHER): Payer: 59 | Admitting: Gastroenterology

## 2022-10-26 ENCOUNTER — Encounter (INDEPENDENT_AMBULATORY_CARE_PROVIDER_SITE_OTHER): Payer: Self-pay | Admitting: Gastroenterology

## 2022-10-26 ENCOUNTER — Other Ambulatory Visit (INDEPENDENT_AMBULATORY_CARE_PROVIDER_SITE_OTHER): Payer: Self-pay | Admitting: Gastroenterology

## 2022-10-26 DIAGNOSIS — K2970 Gastritis, unspecified, without bleeding: Secondary | ICD-10-CM

## 2022-10-30 ENCOUNTER — Encounter (INDEPENDENT_AMBULATORY_CARE_PROVIDER_SITE_OTHER): Payer: Self-pay

## 2022-10-30 ENCOUNTER — Other Ambulatory Visit (INDEPENDENT_AMBULATORY_CARE_PROVIDER_SITE_OTHER): Payer: Self-pay | Admitting: Gastroenterology

## 2022-10-30 DIAGNOSIS — R1010 Upper abdominal pain, unspecified: Secondary | ICD-10-CM

## 2022-10-30 DIAGNOSIS — K2970 Gastritis, unspecified, without bleeding: Secondary | ICD-10-CM

## 2022-10-30 DIAGNOSIS — K529 Noninfective gastroenteritis and colitis, unspecified: Secondary | ICD-10-CM

## 2022-10-30 MED ORDER — DICYCLOMINE HCL 10 MG PO CAPS: 10.0000 mg | ORAL_CAPSULE | Freq: Two times a day (BID) | ORAL | 1 refills | Status: DC | PRN

## 2022-10-30 MED ORDER — DIPHENOXYLATE-ATROPINE 2.5-0.025 MG PO TABS: 1.0000 | ORAL_TABLET | Freq: Four times a day (QID) | ORAL | 1 refills | Status: DC | PRN

## 2022-10-30 MED ORDER — OMEPRAZOLE 40 MG PO CPDR: 40.0000 mg | DELAYED_RELEASE_CAPSULE | Freq: Two times a day (BID) | ORAL | 1 refills | Status: DC

## 2022-10-30 NOTE — Telephone Encounter (Signed)
Patient has an appointment scheduled with [facility] for 01/01/2023, please send in omeprazole 40 mg bid, dicyclomine 10 mg one po Q 12 hour prn, and Lomotil take one po Qid prn loose stools. Please send in enough of these to last the patient until his appointment in December. [employer].

## 2022-10-30 NOTE — Telephone Encounter (Signed)
My Chart Message sent to the patient.   Per Dr. Levon Hedger,   Medication sent, we will discuss decreasing omeprazole to once a day dosing in follow up.  Please keep appointment w [facility] in Dec as planned.     [facility])

## 2023-01-01 ENCOUNTER — Encounter (INDEPENDENT_AMBULATORY_CARE_PROVIDER_SITE_OTHER): Payer: Self-pay | Admitting: Gastroenterology

## 2023-01-01 ENCOUNTER — Ambulatory Visit (INDEPENDENT_AMBULATORY_CARE_PROVIDER_SITE_OTHER): Payer: 59 | Admitting: Gastroenterology

## 2023-01-01 VITALS — BP 144/90 | HR 83 | Temp 97.9°F | Ht 68.0 in | Wt 133.0 lb

## 2023-01-01 DIAGNOSIS — K529 Noninfective gastroenteritis and colitis, unspecified: Secondary | ICD-10-CM

## 2023-01-01 DIAGNOSIS — K219 Gastro-esophageal reflux disease without esophagitis: Secondary | ICD-10-CM | POA: Insufficient documentation

## 2023-01-01 DIAGNOSIS — R110 Nausea: Secondary | ICD-10-CM

## 2023-01-01 NOTE — Patient Instructions (Addendum)
-  continue lomotil up to 4 times per day -continue zofran 4mg  up to 3 times per day as needed  -continue omeprazole 40mg  twice daily for GERD  -continue bentyl 10mg  up to twice daily as needed   Follow up 1 year, or sooner if you have new or worsening symptoms  It was a pleasure to see you today. I want to create trusting relationships with patients and provide genuine, compassionate, and quality care. I truly value your feedback! please be on the lookout for a survey regarding your visit with me today. I appreciate your input about our visit and your time in completing this!    Regie Bunner L. Jeanmarie Hubert, MSN, APRN, AGNP-C Adult-Gerontology Nurse Practitioner [facility]enter[facility] at [facility]

## 2023-01-01 NOTE — Progress Notes (Addendum)
Referring Provider: No ref. provider found Primary Care Physician:  Patient, No Pcp Per Primary GI Physician: Dr. Levon Hedger   Chief Complaint  Patient presents with   Diarrhea    Follow up on diarrhea. States doing ok as long as taking meds. Needs refills on all meds.    Gastroesophageal Reflux    Follow up on GERD. Takes omeprazole and has to take zofran twice daily most days.    HPI:   Reginald [name] is a 57 y.o. male with past medical history of H. Pylori gastroduodenitis, arthritis and migraines   Patient presenting today for follow up of diarrhea, GERD and chronic nausea   Last seen November 2023, at that time reporting that he took treatment for H. pylori as compliantly as possible that he reports given his schedule cannot take tetracycline every 6 hours but instead every 8.  Having mild abdominal pain that occurs infrequently.  No nausea or vomiting.  Some episodes of fecal urgency maybe 3 times per week.  Though stool is more formed.  No fecal soiling.  Patient recommended to perform H. pylori breath test, start Benefiber supplements to increase stool bulk, try Imodium as needed if no improvement with Benefiber.   Patient started on lomotil and plans to discuss decreasing Omeprazole to once daily dosing at follow up  H pylori testing on 11/30 was negative   Present: Having 1-2 BMs per day that are more formed. Taking lomotil 3-4 times per day.  No abdominal pain. Denies any GERD symptoms. Appetite is good. He is very active, works 12 hours per day at a [employer]. Denies rectal bleeding or melena. He did try to decrease his omeprazole to once daily but had flare of symptoms, so he went back to twice daily dosing which seems to control his symptoms better. He has some nausea chronically for the past few years since he had salmonella. He takes zofran as needed, sometimes depends on what he eats. No vomiting. Takes dicyclomine twice a day as well with good results.    CT A/P  with contrast 08/16/21: Normal appendix. Colonic diverticulosis.  No active diverticulitis. Punctate bilateral nephrolithiasis.  No hydronephrosis. Aortic atherosclerosis. No acute findings.   Last EGD: 11/17/2021 - Normal esophagus. - Erosive gastropathy with no stigmata of recent bleeding. Biopsied. - Duodenitis. Biopsied.   Last Colonoscopy: 11/17/2021 - Three 3 to 6 mm polyps in the sigmoid colon and in the transverse colon, removed with a cold snare. Resected and retrieved. - Polypoid lesion in the sigmoid colon. Biopsied. - Diverticulosis in the sigmoid colon and in the descending colon. Biopsied. - The distal rectum and anal verge are normal on retroflexion view.   Path: A. SMALL BOWEL, BIOPSY: Chronic duodenitis with superficial mucosal erosions and reparative changes in the crypts. There are no diagnostic features of celiac disease. Negative for dysplasia and malignancy.  B. STOMACH, BIOPSY: Chronic superficially erosive gastritis. Results of immunostain for H. pylori will be reported as an addendum. Intestinal metaplasia, atrophy and dysplasia are not identified  C. 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 Negative for dysplasia.  D. COLON, RANDOM, BIOPSY: Mild nonspecific inflammation consistent with prep related changes. There are no diagnostic features of inflammatory bowel disease, microscopic colitis and collagenous colitis.  E. COLON, TRANSVERSE, POLYPECTOMY: Hyperplastic polyp. Negative for dysplasia.  F. 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 Negative for dysplasia.    Recommended repeat colonoscopy in 10 years.  Past Medical History:  Diagnosis Date   Arthritis    Headache    migraines     Past Surgical History:  Procedure Laterality Date   BIOPSY  11/17/2021   Procedure: BIOPSY;  Surgeon: Dolores Frame, MD;   [facility];  Service: Gastroenterology;;   COLONOSCOPY WITH PROPOFOL N/A 11/17/2021   Procedure: COLONOSCOPY WITH PROPOFOL;  Surgeon: Dolores Frame, MD;  [facility];  Service: Gastroenterology;  Laterality: N/A;  200 ASA 2   ESOPHAGOGASTRODUODENOSCOPY (EGD) WITH PROPOFOL N/A 11/17/2021   Procedure: ESOPHAGOGASTRODUODENOSCOPY (EGD) WITH PROPOFOL;  Surgeon: Dolores Frame, MD;  [facility];  Service: Gastroenterology;  Laterality: N/A;   INGUINAL HERNIA REPAIR Right 06/02/2015   Procedure: RIGHT INGUINAL HERNIA REPAIR WITH MESH;  Surgeon: Gaynelle Adu, MD;  Location: [facility];  Service: General;  Laterality: Right;   INSERTION OF MESH Right 06/02/2015   Procedure: INSERTION OF MESH;  Surgeon: Gaynelle Adu, MD;  Location: [facility] OR;  Service: General;  Laterality: Right;   NO PAST SURGERIES     POLYPECTOMY  11/17/2021   Procedure: POLYPECTOMY;  Surgeon: Dolores Frame, MD;  [facility];  Service: Gastroenterology;;    Current Outpatient Medications  Medication Sig Dispense Refill   dicyclomine (BENTYL) 10 MG capsule Take 1 capsule (10 mg total) by mouth every 12 (twelve) hours as needed (abdominal pain). 180 capsule 1   diphenoxylate-atropine (LOMOTIL) 2.5-0.025 MG tablet Take 1 tablet by mouth 4 (four) times daily as needed for diarrhea or loose stools. 180 tablet 1   omeprazole (PRILOSEC) 40 MG capsule Take 1 capsule (40 mg total) by mouth 2 (two) times daily. 120 capsule 1   ondansetron (ZOFRAN) 4 MG tablet Take 1 tablet (4 mg total) by mouth every 8 (eight) hours as needed for nausea or vomiting. 90 tablet 3   No current facility-administered medications for this visit.    Allergies as of 01/01/2023 - Review Complete 01/01/2023  Allergen Reaction Noted   Penicillins Rash 04/29/2015    Family History  Problem Relation Age of Onset   Diabetes Mother    Osteoarthritis Mother    Heart disease Father     Social  History   Socioeconomic History   Marital status: Significant Other    Spouse name: Not on file   Number of children: Not on file   Years of education: Not on file   Highest education level: Not on file  Occupational History   Not on file  Tobacco Use   Smoking status: Heavy Smoker    Current packs/day: 1.50    Types: Cigarettes    Passive exposure: Current   Smokeless tobacco: Former  Building services engineer status: Never Used  Substance and Sexual Activity   Alcohol use: No    Alcohol/week: 0.0 standard drinks of alcohol    Comment: formerly a heavy drinker, quit 2002   Drug use: No   Sexual activity: Not on file  Other Topics Concern   Not on file  Social History Narrative   Not on file   Social Determinants of Health   Financial Resource Strain: Not on file  Food Insecurity: Not on file  Transportation Needs: Not on file  Physical Activity: Not on file  Stress: Not on file  Social Connections: Not on file    Review of systems General: negative for malaise, night sweats, fever, chills, weight loss Neck: Negative for lumps, goiter, pain and significant neck swelling  Resp: Negative for cough, wheezing, dyspnea at rest CV: Negative for chest pain, leg swelling, palpitations, orthopnea GI: denies melena, hematochezia, vomiting, diarrhea, constipation, dysphagia, odyonophagia, early satiety or unintentional weight loss. +chronic nausea  MSK: Negative for joint pain or swelling, back pain, and muscle pain. Derm: Negative for itching or rash Psych: Denies depression, anxiety, memory loss, confusion. No homicidal or suicidal ideation.  Heme: Negative for prolonged bleeding, bruising easily, and swollen nodes. Endocrine: Negative for cold or heat intolerance, polyuria, polydipsia and goiter. Neuro: negative for tremor, gait imbalance, syncope and seizures. The remainder of the review of systems is noncontributory.  Physical Exam: BP (!) 144/90   Pulse 83   Temp 97.9 F  (36.6 C) (Oral)   Ht 5\' 8"  (1.727 m)   Wt 133 lb (60.3 kg)   BMI 20.22 kg/m  General:   Alert and oriented. No distress noted. Pleasant and cooperative.  Head:  Normocephalic and atraumatic. Eyes:  Conjuctiva clear without scleral icterus. Mouth:  Oral mucosa pink and moist. Good dentition. No lesions. Heart: Normal rate and rhythm, s1 and s2 heart sounds present.  Lungs: Clear lung sounds in all lobes. Respirations equal and unlabored. Abdomen:  +BS, soft, non-tender and non-distended. No rebound or guarding. No HSM or masses noted. Derm: No palmar erythema or jaundice Msk:  Symmetrical without gross deformities. Normal posture. Extremities:  Without edema. Neurologic:  Alert and  oriented x4 Psych:  Alert and cooperative. Normal mood and affect.  Invalid input(s): "6 MONTHS"   ASSESSMENT: Reginald [name] is a 57 y.o. male presenting today for follow up of nausea, GERD and chronic diarrhea  GERD: well controlled on PPI BID, tried stepping down to once daily dosing but had breakthrough symptoms  Nausea: chronic for the past few years. Uses zofran a few times per week with good results. No vomiting   Diarrhea: chronic. Colonoscopy, EGD, CT A/P, celiac panel, TSH, fecal fat, pancreatic elastase done in the past without findings to explain symptoms. No rectal bleeding or melena. Weight is stable, appetite is good. Taking lomotil and bentyl with good results, having 1-2 solid stools per day on this. Will continue with current regimen.   PLAN:  -continue lomotil up to 4 times per day -continue zofran 4mg  as needed for nausae -continue omeprazole 40mg  BID -continue bentyl 10mg  BID PRN   All questions were answered, patient verbalized understanding and is in agreement with plan as outlined above.   Follow Up: 1 year   Lamyiah Crawshaw L. Jeanmarie Hubert, MSN, APRN, AGNP-C Adult-Gerontology Nurse Practitioner [facility]  I have reviewed the note and agree with the APP's  assessment as described in this progress note  Patient has had investigations for chronic diarrhea which have been negative.  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  If he is still presenting diarrhea in the future, may check a fecal calprotectin testing.  Katrinka Blazing, MD Gastroenterology and Hepatology [facility]

## 2023-01-25 ENCOUNTER — Other Ambulatory Visit (INDEPENDENT_AMBULATORY_CARE_PROVIDER_SITE_OTHER): Payer: Self-pay | Admitting: Gastroenterology

## 2023-01-25 DIAGNOSIS — R112 Nausea with vomiting, unspecified: Secondary | ICD-10-CM

## 2023-01-30 ENCOUNTER — Other Ambulatory Visit (INDEPENDENT_AMBULATORY_CARE_PROVIDER_SITE_OTHER): Payer: Self-pay | Admitting: Gastroenterology

## 2023-01-30 DIAGNOSIS — K529 Noninfective gastroenteritis and colitis, unspecified: Secondary | ICD-10-CM

## 2023-02-21 ENCOUNTER — Other Ambulatory Visit (INDEPENDENT_AMBULATORY_CARE_PROVIDER_SITE_OTHER): Payer: Self-pay | Admitting: Gastroenterology

## 2023-02-21 DIAGNOSIS — R1010 Upper abdominal pain, unspecified: Secondary | ICD-10-CM

## 2023-02-28 ENCOUNTER — Other Ambulatory Visit (INDEPENDENT_AMBULATORY_CARE_PROVIDER_SITE_OTHER): Payer: Self-pay | Admitting: Gastroenterology

## 2023-02-28 DIAGNOSIS — K2970 Gastritis, unspecified, without bleeding: Secondary | ICD-10-CM

## 2023-03-25 ENCOUNTER — Encounter (INDEPENDENT_AMBULATORY_CARE_PROVIDER_SITE_OTHER): Payer: Self-pay

## 2023-03-26 ENCOUNTER — Ambulatory Visit (INDEPENDENT_AMBULATORY_CARE_PROVIDER_SITE_OTHER): Admitting: Gastroenterology

## 2023-04-01 ENCOUNTER — Ambulatory Visit (INDEPENDENT_AMBULATORY_CARE_PROVIDER_SITE_OTHER): Admitting: Gastroenterology

## 2023-04-01 ENCOUNTER — Encounter (INDEPENDENT_AMBULATORY_CARE_PROVIDER_SITE_OTHER): Payer: Self-pay | Admitting: Gastroenterology

## 2023-04-01 VITALS — BP 126/79 | HR 98 | Temp 98.4°F | Ht 68.0 in | Wt 135.0 lb

## 2023-04-01 DIAGNOSIS — K219 Gastro-esophageal reflux disease without esophagitis: Secondary | ICD-10-CM | POA: Diagnosis not present

## 2023-04-01 DIAGNOSIS — R112 Nausea with vomiting, unspecified: Secondary | ICD-10-CM

## 2023-04-01 DIAGNOSIS — R197 Diarrhea, unspecified: Secondary | ICD-10-CM | POA: Diagnosis not present

## 2023-04-01 DIAGNOSIS — R1013 Epigastric pain: Secondary | ICD-10-CM | POA: Diagnosis not present

## 2023-04-01 DIAGNOSIS — K921 Melena: Secondary | ICD-10-CM | POA: Insufficient documentation

## 2023-04-01 DIAGNOSIS — K529 Noninfective gastroenteritis and colitis, unspecified: Secondary | ICD-10-CM

## 2023-04-01 MED ORDER — PANTOPRAZOLE SODIUM 40 MG PO TBEC: 40.0000 mg | DELAYED_RELEASE_TABLET | Freq: Two times a day (BID) | ORAL | 1 refills | Status: DC

## 2023-04-01 MED ORDER — SUCRALFATE 1 GM/10ML PO SUSP: 1.0000 g | Freq: Three times a day (TID) | ORAL | 1 refills | Status: DC

## 2023-04-01 NOTE — Progress Notes (Signed)
 Referring Provider: No ref. provider found Primary Care Physician:  Patient, No Pcp Per Primary GI Physician: Dr. Levon Hedger   Chief Complaint  Patient presents with   Gastroesophageal Reflux    Patient here today for a follow up on Gerd. He says he has some upper to mid sternum pain. He is taking pepto prn he takes omeprazole 40 mg bid, bentyl 10 mg bid and ondansetron 4 mg prn. He says he is still having issues with Genella Rife while on these medications.    HPI:   Reginald [name] is a 58 y.o. male with past medical history of H. Pylori gastroduodenitis, arthritis and migraines   Patient presenting today for:  Diarrhea GERD Chronic nausea Epigastric pain Melena   Last seen December 2024, at that time, patient reported having 1-2 bowel manage days and more formed.  Taking Lomotil 3-4 times per day.  Denying GERD symptoms.  Try to decrease omeprazole to once daily but had flares and went to twice daily dosing which seems to control her symptoms.  Denied any vomiting.  Taking dicyclomine twice daily with good results.  Recommended to continue Lomotil up to 4 times a day, continue Zofran 4 mg as needed for nausea, continue omeprazole 40 mg twice daily, continue Bentyl 10 mg twice daily as needed  Present: Patient states he has been having some issues with burning pain in his epigastric area/chest. He note a pressure sensation at times as well. No radiation of pain. Was seen at [facility] at the end of February, cardiac workup was negative, was told symptoms may be secondary to his GERD. Taking omeprazole 40mg  BID. Taking pepto which helps some. Does not think that symptoms get worse after eating.denies dysphagia or odynophagia. He has had some nausea and vomiting that comes and goes which is chronic for him. Takes zofran with some improvement. Denies any new medications for any reason prior to symptoms starting. No weight loss or changes in appetite. Has had some melanotic stools for the past week or so.  No rectal bleeding. He is taking about 400mg  ibuprofen 2-3 times per week for a while for chronic joint pain.    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    Takes lomotil maybe BID. Diarrhea Is well controlled with this, taking bentyl BID as well.        CT A/P with contrast 08/16/21: Normal appendix. Colonic diverticulosis.  No active diverticulitis. Punctate bilateral nephrolithiasis.  No hydronephrosis. Aortic atherosclerosis. No acute findings.   Last EGD: 11/17/2021 - Normal esophagus. - Erosive gastropathy with no stigmata of recent bleeding. Biopsied. - Duodenitis. Biopsied.   Last Colonoscopy: 11/17/2021 - Three 3 to 6 mm polyps in the sigmoid colon and in the transverse colon, removed with a cold snare. Resected and retrieved. - Polypoid lesion in the sigmoid colon. Biopsied. - Diverticulosis in the sigmoid colon and in the descending colon. Biopsied. - The distal rectum and anal verge are normal on retroflexion view.   Path: A. SMALL BOWEL, BIOPSY: Chronic duodenitis with superficial mucosal erosions and reparative changes in the crypts. There are no diagnostic features of celiac disease. Negative for dysplasia and malignancy.  B. STOMACH, BIOPSY: Chronic superficially erosive gastritis. Results of immunostain for H. pylori will be reported as an addendum. Intestinal metaplasia, atrophy and dysplasia are not identified  C. 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 Negative for dysplasia.  D. COLON, RANDOM, BIOPSY: Mild nonspecific inflammation consistent with prep related changes. There are no diagnostic features of inflammatory bowel disease, microscopic colitis and collagenous colitis.  E. COLON, TRANSVERSE, POLYPECTOMY: Hyperplastic polyp. Negative for  dysplasia.  F. 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 Negative for dysplasia.    Recommended repeat colonoscopy in 10 years.  @ Filed Weights   04/01/23 1350  Weight: 135 lb (61.2 kg)     Past Medical History:  Diagnosis Date   Arthritis    Headache    migraines     Past Surgical History:  Procedure Laterality Date   BIOPSY  11/17/2021   Procedure: BIOPSY;  Surgeon: Dolores Frame, MD;  [facility];  Service: Gastroenterology;;   COLONOSCOPY WITH PROPOFOL N/A 11/17/2021   Procedure: COLONOSCOPY WITH PROPOFOL;  Surgeon: Dolores Frame, MD;  [facility];  Service: Gastroenterology;  Laterality: N/A;  200 ASA 2   ESOPHAGOGASTRODUODENOSCOPY (EGD) WITH PROPOFOL N/A 11/17/2021   Procedure: ESOPHAGOGASTRODUODENOSCOPY (EGD) WITH PROPOFOL;  Surgeon: Dolores Frame, MD;  [facility];  Service: Gastroenterology;  Laterality: N/A;   INGUINAL HERNIA REPAIR Right 06/02/2015   Procedure: RIGHT INGUINAL HERNIA REPAIR WITH MESH;  Surgeon: Gaynelle Adu, MD;  Location: [facility] OR;  Service: General;  Laterality: Right;   INSERTION OF MESH Right 06/02/2015   Procedure: INSERTION OF MESH;  Surgeon: Gaynelle Adu, MD;  Location: [facility] OR;  Service: General;  Laterality: Right;   NO PAST SURGERIES     POLYPECTOMY  11/17/2021   Procedure: POLYPECTOMY;  Surgeon: Dolores Frame, MD;  [facility];  Service: Gastroenterology;;    Current Outpatient Medications  Medication Sig Dispense Refill   dicyclomine (BENTYL) 10 MG capsule TAKE 1 CAPSULE (10 MG TOTAL) BY MOUTH EVERY 12 (TWELVE) HOURS AS NEEDED (ABDOMINAL PAIN). 180 capsule 1   diphenoxylate-atropine (LOMOTIL) 2.5-0.025 MG tablet TAKE 1 TABLET BY MOUTH 4 (FOUR) TIMES DAILY AS NEEDED FOR DIARRHEA OR LOOSE STOOLS. 180 tablet 1   omeprazole (PRILOSEC) 40 MG capsule TAKE 1 CAPSULE BY MOUTH TWICE A DAY 120 capsule 1   ondansetron  (ZOFRAN) 4 MG tablet TAKE 1 TABLET BY MOUTH EVERY 8 HOURS AS NEEDED FOR NAUSEA AND VOMITING 90 tablet 3   No current facility-administered medications for this visit.    Allergies as of 04/01/2023 - Review Complete 04/01/2023  Allergen Reaction Noted   Penicillins Rash 04/29/2015    Social History   Socioeconomic History   Marital status: Significant Other    Spouse name: Not on file   Number of children: Not on file   Years of education: Not on file   Highest education level: Not on file  Occupational History   Not on file  Tobacco Use   Smoking status: Heavy Smoker    Current packs/day: 1.50    Types: Cigarettes    Passive exposure: Current   Smokeless tobacco: Former  Building services engineer status: Never Used  Substance and Sexual Activity   Alcohol use: No    Alcohol/week: 0.0 standard drinks of alcohol    Comment: formerly a heavy drinker, quit 2002   Drug use: No   Sexual activity: Not on file  Other Topics Concern   Not on file  Social History Narrative   Not on file   Social Drivers of Health   Financial Resource Strain: Low Risk  (03/20/2023)  Received from [facility]   Overall Financial Resource Strain (CARDIA)    Difficulty of Paying Living Expenses: Not hard at all  Food Insecurity: No Food Insecurity (03/20/2023)   Received from [facility]   Hunger Vital Sign    Worried About Running Out of Food in the Last Year: Never true    Ran Out of Food in the Last Year: Never true  Transportation Needs: No Transportation Needs (03/20/2023)   Received from [facility] - Transportation    Lack of Transportation (Medical): No    Lack of Transportation (Non-Medical): No  Physical Activity: Sufficiently Active (03/20/2023)   Received from [facility]   Exercise Vital Sign    Days of Exercise per Week: 7 days    Minutes of Exercise per Session: 30 min  Stress: No Stress Concern Present (03/20/2023)   Received from [facility] - Occupational Stress Questionnaire    Feeling of Stress : Only a little  Social Connections: Moderately Integrated (03/20/2023)   Received from [facility]   Social Connection and Isolation Panel [NHANES]    Frequency of Communication with Friends and Family: More than three times a week    Frequency of Social Gatherings with Friends and Family: Never    Attends Religious Services: More than 4 times per year    Active Member of [employer] or Organizations: No    Attends Engineer, structural: Never    Marital Status: Living with partner    Review of systems General: negative for malaise, night sweats, fever, chills, weight loss Neck: Negative for lumps, goiter, pain and significant neck swelling Resp: Negative for cough, wheezing, dyspnea at rest CV: Negative for chest pain, leg swelling, palpitations, orthopnea GI: denies hematochezia, diarrhea, constipation, dysphagia, odyonophagia, early satiety or unintentional weight loss. +nausea +epigastric pain melena The remainder of the review of systems is noncontributory.  Physical Exam: BP 126/79 (BP Location: Right Arm, Patient Position: Sitting, Cuff Size: Normal)   Pulse 98   Temp 98.4 F (36.9 C) (Temporal)   Ht 5\' 8"  (1.727 m)   Wt 135 lb (61.2 kg)   BMI 20.53 kg/m  General:   Alert and oriented. No distress noted. Pleasant and cooperative.  Head:  Normocephalic and atraumatic. Eyes:  Conjuctiva clear without scleral icterus. Mouth:  Oral mucosa pink and moist. Good dentition. No lesions. Heart: Normal rate and rhythm, s1 and s2 heart sounds present.  Lungs: Clear lung sounds in all lobes. Respirations equal and unlabored. Abdomen:  +BS, soft, non-tender and non-distended. No rebound or guarding. No HSM or masses noted.  Neurologic:  Alert and  oriented x4 Psych:  Alert and cooperative. Normal mood and affect.  Invalid input(s): "6 MONTHS"   ASSESSMENT: SOTA HETZ is a 58  y.o. male presenting today GERD, Chronic nausea, Diarrhea, Melena and epigastric pain  GERD/Epigastric pain/Melena:maintained on omeprazole 40mg  BID, having significant epigastric pain and burning for the past month or so with noted melena over the past week or so. He does admit to taking ibuprofen 400mg  a few times per week. Taking pepto PRN which helps some. Denies dysphagia or odynophagia,  would recommend proceeding with EGD for further evaluation as in setting of NSAID use with epigastric pain and melena, cannot rule out esophagitis, gastritis, PUD, duodenitis. Will stop omeprazole and start protonix 40mg  BID as well as carafate 1g QID, he was advised to avoid all  NSAIDs at this time. Indications, risks and benefits of procedure discussed in detail with patient. Patient verbalized understanding and is in agreement to proceed with EGD.   Chronic nausea: mostly well managed with zofran, though worsening nausea and vomiting recently since epigastric pain began, seems to be better controlled over the last few days.   Diarrhea: well managed with lomotil BID, bentyl BID. Will continue with current regimen    PLAN:  Stop omeprazole Start protonix 40mg  BID Carafate 1g QID  Avoid NSAIDs Schedule EGD ASA II  Continue lomotil and bentyl PRN Continue zofran PRN for nausea   All questions were answered, patient verbalized understanding and is in agreement with plan as outlined above.    Follow Up: 3 months   Cebastian Neis L. Jeanmarie Hubert, MSN, APRN, AGNP-C Adult-Gerontology Nurse Practitioner [facility]  I have reviewed the note and agree with the APP's assessment as described in this progress note  Katrinka Blazing, MD Gastroenterology and Hepatology [facility] Gastroenterology

## 2023-04-01 NOTE — H&P (View-Only) (Signed)
 Referring Provider: No ref. provider found Primary Care Physician:  Patient, No Pcp Per Primary GI Physician: Dr. Levon Hedger   Chief Complaint  Patient presents with   Gastroesophageal Reflux    Patient here today for a follow up on Gerd. He says he has some upper to mid sternum pain. He is taking pepto prn he takes omeprazole 40 mg bid, bentyl 10 mg bid and ondansetron 4 mg prn. He says he is still having issues with Genella Rife while on these medications.    HPI:   Reginald [name] is a 58 y.o. male with past medical history of H. Pylori gastroduodenitis, arthritis and migraines   Patient presenting today for:  Diarrhea GERD Chronic nausea Epigastric pain Melena   Last seen December 2024, at that time, patient reported having 1-2 bowel manage days and more formed.  Taking Lomotil 3-4 times per day.  Denying GERD symptoms.  Try to decrease omeprazole to once daily but had flares and went to twice daily dosing which seems to control her symptoms.  Denied any vomiting.  Taking dicyclomine twice daily with good results.  Recommended to continue Lomotil up to 4 times a day, continue Zofran 4 mg as needed for nausea, continue omeprazole 40 mg twice daily, continue Bentyl 10 mg twice daily as needed  Present: Patient states he has been having some issues with burning pain in his epigastric area/chest. He note a pressure sensation at times as well. No radiation of pain. Was seen at [facility] at the end of February, cardiac workup was negative, was told symptoms may be secondary to his GERD. Taking omeprazole 40mg  BID. Taking pepto which helps some. Does not think that symptoms get worse after eating.denies dysphagia or odynophagia. He has had some nausea and vomiting that comes and goes which is chronic for him. Takes zofran with some improvement. Denies any new medications for any reason prior to symptoms starting. No weight loss or changes in appetite. Has had some melanotic stools for the past week or so.  No rectal bleeding. He is taking about 400mg  ibuprofen 2-3 times per week for a while for chronic joint pain.    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    Takes lomotil maybe BID. Diarrhea Is well controlled with this, taking bentyl BID as well.        CT A/P with contrast 08/16/21: Normal appendix. Colonic diverticulosis.  No active diverticulitis. Punctate bilateral nephrolithiasis.  No hydronephrosis. Aortic atherosclerosis. No acute findings.   Last EGD: 11/17/2021 - Normal esophagus. - Erosive gastropathy with no stigmata of recent bleeding. Biopsied. - Duodenitis. Biopsied.   Last Colonoscopy: 11/17/2021 - Three 3 to 6 mm polyps in the sigmoid colon and in the transverse colon, removed with a cold snare. Resected and retrieved. - Polypoid lesion in the sigmoid colon. Biopsied. - Diverticulosis in the sigmoid colon and in the descending colon. Biopsied. - The distal rectum and anal verge are normal on retroflexion view.   Path: A. SMALL BOWEL, BIOPSY: Chronic duodenitis with superficial mucosal erosions and reparative changes in the crypts. There are no diagnostic features of celiac disease. Negative for dysplasia and malignancy.  B. STOMACH, BIOPSY: Chronic superficially erosive gastritis. Results of immunostain for H. pylori will be reported as an addendum. Intestinal metaplasia, atrophy and dysplasia are not identified  C. 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 Negative for dysplasia.  D. COLON, RANDOM, BIOPSY: Mild nonspecific inflammation consistent with prep related changes. There are no diagnostic features of inflammatory bowel disease, microscopic colitis and collagenous colitis.  E. COLON, TRANSVERSE, POLYPECTOMY: Hyperplastic polyp. Negative for  dysplasia.  F. 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 Negative for dysplasia.    Recommended repeat colonoscopy in 10 years.  @ Filed Weights   04/01/23 1350  Weight: 135 lb (61.2 kg)     Past Medical History:  Diagnosis Date   Arthritis    Headache    migraines     Past Surgical History:  Procedure Laterality Date   BIOPSY  11/17/2021   Procedure: BIOPSY;  Surgeon: Dolores Frame, MD;  [facility];  Service: Gastroenterology;;   COLONOSCOPY WITH PROPOFOL N/A 11/17/2021   Procedure: COLONOSCOPY WITH PROPOFOL;  Surgeon: Dolores Frame, MD;  [facility];  Service: Gastroenterology;  Laterality: N/A;  200 ASA 2   ESOPHAGOGASTRODUODENOSCOPY (EGD) WITH PROPOFOL N/A 11/17/2021   Procedure: ESOPHAGOGASTRODUODENOSCOPY (EGD) WITH PROPOFOL;  Surgeon: Dolores Frame, MD;  [facility];  Service: Gastroenterology;  Laterality: N/A;   INGUINAL HERNIA REPAIR Right 06/02/2015   Procedure: RIGHT INGUINAL HERNIA REPAIR WITH MESH;  Surgeon: Gaynelle Adu, MD;  Location: [facility] OR;  Service: General;  Laterality: Right;   INSERTION OF MESH Right 06/02/2015   Procedure: INSERTION OF MESH;  Surgeon: Gaynelle Adu, MD;  Location: [facility] OR;  Service: General;  Laterality: Right;   NO PAST SURGERIES     POLYPECTOMY  11/17/2021   Procedure: POLYPECTOMY;  Surgeon: Dolores Frame, MD;  [facility];  Service: Gastroenterology;;    Current Outpatient Medications  Medication Sig Dispense Refill   dicyclomine (BENTYL) 10 MG capsule TAKE 1 CAPSULE (10 MG TOTAL) BY MOUTH EVERY 12 (TWELVE) HOURS AS NEEDED (ABDOMINAL PAIN). 180 capsule 1   diphenoxylate-atropine (LOMOTIL) 2.5-0.025 MG tablet TAKE 1 TABLET BY MOUTH 4 (FOUR) TIMES DAILY AS NEEDED FOR DIARRHEA OR LOOSE STOOLS. 180 tablet 1   omeprazole (PRILOSEC) 40 MG capsule TAKE 1 CAPSULE BY MOUTH TWICE A DAY 120 capsule 1   ondansetron  (ZOFRAN) 4 MG tablet TAKE 1 TABLET BY MOUTH EVERY 8 HOURS AS NEEDED FOR NAUSEA AND VOMITING 90 tablet 3   No current facility-administered medications for this visit.    Allergies as of 04/01/2023 - Review Complete 04/01/2023  Allergen Reaction Noted   Penicillins Rash 04/29/2015    Social History   Socioeconomic History   Marital status: Significant Other    Spouse name: Not on file   Number of children: Not on file   Years of education: Not on file   Highest education level: Not on file  Occupational History   Not on file  Tobacco Use   Smoking status: Heavy Smoker    Current packs/day: 1.50    Types: Cigarettes    Passive exposure: Current   Smokeless tobacco: Former  Building services engineer status: Never Used  Substance and Sexual Activity   Alcohol use: No    Alcohol/week: 0.0 standard drinks of alcohol    Comment: formerly a heavy drinker, quit 2002   Drug use: No   Sexual activity: Not on file  Other Topics Concern   Not on file  Social History Narrative   Not on file   Social Drivers of Health   Financial Resource Strain: Low Risk  (03/20/2023)  Received from [facility]   Overall Financial Resource Strain (CARDIA)    Difficulty of Paying Living Expenses: Not hard at all  Food Insecurity: No Food Insecurity (03/20/2023)   Received from [facility]   Hunger Vital Sign    Worried About Running Out of Food in the Last Year: Never true    Ran Out of Food in the Last Year: Never true  Transportation Needs: No Transportation Needs (03/20/2023)   Received from [facility] - Transportation    Lack of Transportation (Medical): No    Lack of Transportation (Non-Medical): No  Physical Activity: Sufficiently Active (03/20/2023)   Received from [facility]   Exercise Vital Sign    Days of Exercise per Week: 7 days    Minutes of Exercise per Session: 30 min  Stress: No Stress Concern Present (03/20/2023)   Received from [facility] - Occupational Stress Questionnaire    Feeling of Stress : Only a little  Social Connections: Moderately Integrated (03/20/2023)   Received from [facility]   Social Connection and Isolation Panel [NHANES]    Frequency of Communication with Friends and Family: More than three times a week    Frequency of Social Gatherings with Friends and Family: Never    Attends Religious Services: More than 4 times per year    Active Member of [employer] or Organizations: No    Attends Engineer, structural: Never    Marital Status: Living with partner    Review of systems General: negative for malaise, night sweats, fever, chills, weight loss Neck: Negative for lumps, goiter, pain and significant neck swelling Resp: Negative for cough, wheezing, dyspnea at rest CV: Negative for chest pain, leg swelling, palpitations, orthopnea GI: denies hematochezia, diarrhea, constipation, dysphagia, odyonophagia, early satiety or unintentional weight loss. +nausea +epigastric pain melena The remainder of the review of systems is noncontributory.  Physical Exam: BP 126/79 (BP Location: Right Arm, Patient Position: Sitting, Cuff Size: Normal)   Pulse 98   Temp 98.4 F (36.9 C) (Temporal)   Ht 5\' 8"  (1.727 m)   Wt 135 lb (61.2 kg)   BMI 20.53 kg/m  General:   Alert and oriented. No distress noted. Pleasant and cooperative.  Head:  Normocephalic and atraumatic. Eyes:  Conjuctiva clear without scleral icterus. Mouth:  Oral mucosa pink and moist. Good dentition. No lesions. Heart: Normal rate and rhythm, s1 and s2 heart sounds present.  Lungs: Clear lung sounds in all lobes. Respirations equal and unlabored. Abdomen:  +BS, soft, non-tender and non-distended. No rebound or guarding. No HSM or masses noted.  Neurologic:  Alert and  oriented x4 Psych:  Alert and cooperative. Normal mood and affect.  Invalid input(s): "6 MONTHS"   ASSESSMENT: Reginald [name] is a 58  y.o. male presenting today GERD, Chronic nausea, Diarrhea, Melena and epigastric pain  GERD/Epigastric pain/Melena:maintained on omeprazole 40mg  BID, having significant epigastric pain and burning for the past month or so with noted melena over the past week or so. He does admit to taking ibuprofen 400mg  a few times per week. Taking pepto PRN which helps some. Denies dysphagia or odynophagia,  would recommend proceeding with EGD for further evaluation as in setting of NSAID use with epigastric pain and melena, cannot rule out esophagitis, gastritis, PUD, duodenitis. Will stop omeprazole and start protonix 40mg  BID as well as carafate 1g QID, he was advised to avoid all  NSAIDs at this time. Indications, risks and benefits of procedure discussed in detail with patient. Patient verbalized understanding and is in agreement to proceed with EGD.   Chronic nausea: mostly well managed with zofran, though worsening nausea and vomiting recently since epigastric pain began, seems to be better controlled over the last few days.   Diarrhea: well managed with lomotil BID, bentyl BID. Will continue with current regimen    PLAN:  Stop omeprazole Start protonix 40mg  BID Carafate 1g QID  Avoid NSAIDs Schedule EGD ASA II  Continue lomotil and bentyl PRN Continue zofran PRN for nausea   All questions were answered, patient verbalized understanding and is in agreement with plan as outlined above.    Follow Up: 3 months   Shirley Bolle L. Jeanmarie Hubert, MSN, APRN, AGNP-C Adult-Gerontology Nurse Practitioner [facility]  I have reviewed the note and agree with the APP's assessment as described in this progress note  Katrinka Blazing, MD Gastroenterology and Hepatology [facility]

## 2023-04-01 NOTE — Patient Instructions (Signed)
 Stop omeprazole Start protonix 40mg  twice daily  Carafate 1g before meals and at bedtime, as discussed if liquid version is not covered, please dissolve pill in 30ml water, mix and drink as this works better in liquid form  Please avoid NSAIDs (advil, aleve, naproxen, goody powder, ibuprofen) as these can be very hard on your GI tract, causing inflammation, ulcers and damage to the lining of your GI tract.  We will get you scheduled for EGD  Follow up 3 months  It was a pleasure to see you today. I want to create trusting relationships with patients and provide genuine, compassionate, and quality care. I truly value your feedback! please be on the lookout for a survey regarding your visit with me today. I appreciate your input about our visit and your time in completing this!    Mariacristina Aday L. Jeanmarie Hubert, MSN, APRN, AGNP-C Adult-Gerontology Nurse Practitioner [facility]astroenterology at [facility]

## 2023-04-01 NOTE — Progress Notes (Deleted)
 Marland Kitchen

## 2023-04-02 ENCOUNTER — Other Ambulatory Visit (INDEPENDENT_AMBULATORY_CARE_PROVIDER_SITE_OTHER): Payer: Self-pay | Admitting: Gastroenterology

## 2023-04-02 ENCOUNTER — Telehealth (INDEPENDENT_AMBULATORY_CARE_PROVIDER_SITE_OTHER): Payer: Self-pay | Admitting: Gastroenterology

## 2023-04-02 MED ORDER — SUCRALFATE 1 G PO TABS: 1.0000 g | ORAL_TABLET | Freq: Three times a day (TID) | ORAL | 1 refills | Status: AC

## 2023-04-02 NOTE — Telephone Encounter (Signed)
 Reginald [name], I think this is the patient you told could do tablet if liquid was too expensive

## 2023-04-02 NOTE — Telephone Encounter (Signed)
 Contacted pt insurance and no pre cert needed for EGD

## 2023-04-18 ENCOUNTER — Encounter (INDEPENDENT_AMBULATORY_CARE_PROVIDER_SITE_OTHER): Payer: Self-pay

## 2023-04-18 MED ORDER — PROMETHAZINE HCL 12.5 MG PO TABS: 12.5000 mg | ORAL_TABLET | Freq: Four times a day (QID) | ORAL | 0 refills | Status: DC | PRN

## 2023-04-18 NOTE — Telephone Encounter (Signed)
 Seen 3/10 and omeprazole was changed to protonix 40mg  bid and carafate was given. EGD is 4/2

## 2023-04-18 NOTE — Telephone Encounter (Signed)
 Per chelsea ok to send in phenergan 12.5 mg #30 one every 6 hours. 0 refills.

## 2023-04-24 ENCOUNTER — Encounter (HOSPITAL_COMMUNITY): Payer: Self-pay | Admitting: Gastroenterology

## 2023-04-24 ENCOUNTER — Encounter (HOSPITAL_COMMUNITY)
Admission: RE | Disposition: A | Discharge status: Home / Self Care | Payer: Self-pay | Source: Home / Self Care | Attending: Gastroenterology

## 2023-04-24 ENCOUNTER — Ambulatory Visit (HOSPITAL_COMMUNITY): Admitting: Anesthesiology

## 2023-04-24 ENCOUNTER — Other Ambulatory Visit: Payer: Self-pay

## 2023-04-24 ENCOUNTER — Ambulatory Visit (HOSPITAL_COMMUNITY)
Admission: RE | Admit: 2023-04-24 | Discharge: 2023-04-24 | Disposition: A | Discharge status: Home / Self Care | Attending: Gastroenterology | Admitting: Gastroenterology

## 2023-04-24 DIAGNOSIS — F17210 Nicotine dependence, cigarettes, uncomplicated: Secondary | ICD-10-CM | POA: Diagnosis not present

## 2023-04-24 DIAGNOSIS — R1013 Epigastric pain: Secondary | ICD-10-CM

## 2023-04-24 DIAGNOSIS — K2950 Unspecified chronic gastritis without bleeding: Secondary | ICD-10-CM | POA: Diagnosis not present

## 2023-04-24 DIAGNOSIS — K219 Gastro-esophageal reflux disease without esophagitis: Secondary | ICD-10-CM | POA: Diagnosis not present

## 2023-04-24 HISTORY — PX: ESOPHAGOGASTRODUODENOSCOPY: SHX5428

## 2023-04-24 SURGERY — EGD (ESOPHAGOGASTRODUODENOSCOPY)
Anesthesia: General

## 2023-04-24 MED ORDER — LIDOCAINE HCL (CARDIAC) PF 100 MG/5ML IV SOSY
PREFILLED_SYRINGE | INTRAVENOUS | Status: DC | PRN
  Administered 2023-04-24: 50 mg via INTRAVENOUS

## 2023-04-24 MED ORDER — SODIUM CHLORIDE 0.9% FLUSH: 3.0000 mL | INTRAVENOUS | Status: DC | PRN

## 2023-04-24 MED ORDER — LACTATED RINGERS IV SOLN: INTRAVENOUS | Status: DC | PRN

## 2023-04-24 MED ORDER — SODIUM CHLORIDE 0.9% FLUSH: 3.0000 mL | Freq: Two times a day (BID) | INTRAVENOUS | Status: DC

## 2023-04-24 MED ORDER — PROPOFOL 500 MG/50ML IV EMUL
INTRAVENOUS | Status: DC | PRN
  Administered 2023-04-24: 100 mg via INTRAVENOUS
  Administered 2023-04-24 (×2): 50 mg via INTRAVENOUS

## 2023-04-24 MED ORDER — PHENYLEPHRINE 80 MCG/ML (10ML) SYRINGE FOR IV PUSH (FOR BLOOD PRESSURE SUPPORT)
PREFILLED_SYRINGE | INTRAVENOUS | Status: DC | PRN
  Administered 2023-04-24 (×2): 160 ug via INTRAVENOUS

## 2023-04-24 MED ORDER — OMEPRAZOLE 40 MG PO CPDR: 40.0000 mg | DELAYED_RELEASE_CAPSULE | Freq: Every day | ORAL | 3 refills | Status: DC

## 2023-04-24 NOTE — Progress Notes (Signed)
 EKG obtained per Dr. Leta Jungling. Results abnormal.  Ok to proceed with procedure today per Dr. Leta Jungling.  Copy given to pt and advised to follow up w/ primary.  Pt and significant other verbalized understanding.

## 2023-04-24 NOTE — Transfer of Care (Signed)
 Immediate Anesthesia Transfer of Care Note  Patient: Reginald [name]  Procedure(s) Performed: EGD (ESOPHAGOGASTRODUODENOSCOPY)  Patient Location: Short Stay  Anesthesia Type:General  Level of Consciousness: awake, alert , oriented, and patient cooperative  Airway & Oxygen Therapy: Patient Spontanous Breathing  Post-op Assessment: Report given to RN, Post -op Vital signs reviewed and stable, and Patient moving all extremities X 4  Post vital signs: Reviewed and stable  Last Vitals:  Vitals Value Taken Time  BP 111/58 04/24/23 0937  Temp 36.4 C 04/24/23 0937  Pulse 75 04/24/23 0937  Resp 20 04/24/23 0937  SpO2 95 % 04/24/23 0937    Last Pain:  Vitals:   04/24/23 0937  TempSrc: Oral  PainSc:       Patients Stated Pain Goal: 6 (04/24/23 0759)  Complications: No notable events documented.

## 2023-04-24 NOTE — Op Note (Signed)
 [facility] Patient Name: Reginald [name] Procedure Date: 04/24/2023 9:17 AM MRN: [id number] Date of Birth: 1965-09-01 Attending MD: Katrinka Blazing , , [id number] CSN: [id number] Age: 58 Admit Type: Outpatient Procedure:                Upper GI endoscopy Indications:              Epigastric abdominal pain, Heartburn Providers:                Katrinka Blazing, Buel Ream. Thomasena Edis RN, RN, Pandora Leiter, Technician Referring MD:              Medicines:                Monitored Anesthesia Care Complications:            No immediate complications. Estimated Blood Loss:     Estimated blood loss: none. Procedure:                Pre-Anesthesia Assessment:                           - Prior to the procedure, a History and Physical                            was performed, and patient medications, allergies                            and sensitivities were reviewed. The patient's                            tolerance of previous anesthesia was reviewed.                           - The risks and benefits of the procedure and the                            sedation options and risks were discussed with the                            patient. All questions were answered and informed                            consent was obtained.                           - ASA Grade Assessment: II - A patient with mild                            systemic disease.                           After obtaining informed consent, the endoscope was                            passed under direct vision. Throughout the  procedure, the patient's blood pressure, pulse, and                            oxygen saturations were monitored continuously. The                            [id number]-[id number] ([id number]) scope was introduced through the                            mouth, and advanced to the second part of duodenum.                            The upper GI endoscopy was accomplished  without                            difficulty. The patient tolerated the procedure                            well. Scope In: 9:26:59 AM Scope Out: 9:32:13 AM Total Procedure Duration: 0 hours 5 minutes 14 seconds  Findings:      The esophagus was normal.      The stomach was normal. Biopsies were taken with a cold forceps for       Helicobacter pylori testing.      The examined duodenum was normal. Impression:               - Normal esophagus.                           - Normal stomach. Biopsied.                           - Normal examined duodenum. Moderate Sedation:      Per Anesthesia Care Recommendation:           - Discharge patient to home (ambulatory).                           - Resume previous diet.                           - Await pathology results.                           - Stop pantoprazole, start omeprazole 40 mg qday 30                            minutes before breakfast.                           - If normal biopsies, proceed with repeat CT A/P                            with IV contrast. Procedure Code(s):        --- Professional ---  78469, Esophagogastroduodenoscopy, flexible,                            transoral; with biopsy, single or multiple Diagnosis Code(s):        --- Professional ---                           R10.13, Epigastric pain                           R12, Heartburn CPT copyright 2022 American Medical Association. All rights reserved. The codes documented in this report are preliminary and upon coder review may  be revised to meet current compliance requirements. Katrinka Blazing, MD Katrinka Blazing,  04/24/2023 9:37:19 AM This report has been signed electronically. Number of Addenda: 0

## 2023-04-24 NOTE — Anesthesia Preprocedure Evaluation (Addendum)
 Anesthesia Evaluation  Patient identified by MRN, date of birth, ID band Patient awake    Reviewed: Allergy & Precautions, H&P , NPO status , Patient's Chart, lab work & pertinent test results, reviewed documented beta blocker date and time   Airway Mallampati: II  TM Distance: >3 FB Neck ROM: full    Dental  (+) Dental Advisory Given, Poor Dentition, Missing, Edentulous Upper Only a few rotten front teeth lower.  Need to be pulled:   Pulmonary Current Smoker   Pulmonary exam normal breath sounds clear to auscultation       Cardiovascular Exercise Tolerance: Good Normal cardiovascular exam Rhythm:regular Rate:Normal  Unusual ST seg on rhythm strip. Ordered 12 lead.  Pain sounds like gastric/ acid and not angina.  ECG shows anterior lateral ischemia possible in ST seg as expected.  We will give him a copy to take to his doctor, who can decide if further work up is helpful.  I do not see an immediate concern after listening to his symptoms.   Neuro/Psych  Headaches  negative psych ROS   GI/Hepatic Neg liver ROS,GERD  ,,  Endo/Other  negative endocrine ROS    Renal/GU negative Renal ROS  negative genitourinary   Musculoskeletal  (+) Arthritis , Osteoarthritis,    Abdominal   Peds  Hematology negative hematology ROS (+)   Anesthesia Other Findings   Reproductive/Obstetrics negative OB ROS                             Anesthesia Physical Anesthesia Plan  ASA: 2  Anesthesia Plan: General   Post-op Pain Management: Minimal or no pain anticipated   Induction: Intravenous  PONV Risk Score and Plan: Propofol infusion  Airway Management Planned: Nasal Cannula and Natural Airway  Additional Equipment: None  Intra-op Plan:   Post-operative Plan:   Informed Consent: I have reviewed the patients History and Physical, chart, labs and discussed the procedure including the risks, benefits and  alternatives for the proposed anesthesia with the patient or authorized representative who has indicated his/her understanding and acceptance.     Dental Advisory Given  Plan Discussed with: CRNA  Anesthesia Plan Comments:         Anesthesia Quick Evaluation

## 2023-04-24 NOTE — Discharge Instructions (Signed)
 You are being discharged to home.  Resume your previous diet.  We are waiting for your pathology results.  Stop pantoprazole, start omeprazole 40 mg qday 30 minutes before breakfast.

## 2023-04-24 NOTE — Progress Notes (Signed)
 Noted ST depression on ECG in preop.  Pt having EGD today for Epigastric/Chest pain and burning. Pt presently c/o 6/10 burning from mid upper abdomen to tongue. Informed Dr, Leta Jungling of ECG and pain concerns.  No new orders noted at this time.

## 2023-04-24 NOTE — Interval H&P Note (Signed)
 History and Physical Interval Note:  04/24/2023 7:46 AM  Reginald [name]  has presented today for surgery, with the diagnosis of MELENA, EPIGASTRIC PAIN.  The various methods of treatment have been discussed with the patient and family. After consideration of risks, benefits and other options for treatment, the patient has consented to  Procedure(s) with comments: EGD (ESOPHAGOGASTRODUODENOSCOPY) (N/A) - 9:15AM;ASA 2 as a surgical intervention.  The patient's history has been reviewed, patient examined, no change in status, stable for surgery.  I have reviewed the patient's chart and labs.  Questions were answered to the patient's satisfaction.     Katrinka Blazing Mayorga

## 2023-04-24 NOTE — Anesthesia Postprocedure Evaluation (Signed)
 Anesthesia Post Note  Patient: Kolyn L Sykora  Procedure(s) Performed: EGD (ESOPHAGOGASTRODUODENOSCOPY)  Patient location during evaluation: PACU Anesthesia Type: General Level of consciousness: awake and alert Pain management: pain level controlled Vital Signs Assessment: post-procedure vital signs reviewed and stable Respiratory status: spontaneous breathing, nonlabored ventilation, respiratory function stable and patient connected to nasal cannula oxygen Cardiovascular status: blood pressure returned to baseline and stable Postop Assessment: no apparent nausea or vomiting Anesthetic complications: no   There were no known notable events for this encounter.   Last Vitals:  Vitals:   04/24/23 0937 04/24/23 0944  BP: (!) 111/58 114/70  Pulse: 75   Resp: 20   Temp: 36.4 C   SpO2: 95%     Last Pain:  Vitals:   04/24/23 0944  TempSrc:   PainSc: 0-No pain                 Evagelia Knack L Jahzier Villalon

## 2023-04-25 ENCOUNTER — Encounter (HOSPITAL_COMMUNITY): Payer: Self-pay | Admitting: Gastroenterology

## 2023-04-29 LAB — SURGICAL PATHOLOGY

## 2023-05-04 ENCOUNTER — Other Ambulatory Visit (INDEPENDENT_AMBULATORY_CARE_PROVIDER_SITE_OTHER): Payer: Self-pay | Admitting: Gastroenterology

## 2023-05-07 ENCOUNTER — Encounter (INDEPENDENT_AMBULATORY_CARE_PROVIDER_SITE_OTHER): Payer: Self-pay

## 2023-05-07 ENCOUNTER — Other Ambulatory Visit (INDEPENDENT_AMBULATORY_CARE_PROVIDER_SITE_OTHER): Payer: Self-pay

## 2023-05-07 DIAGNOSIS — B9681 Helicobacter pylori [H. pylori] as the cause of diseases classified elsewhere: Secondary | ICD-10-CM

## 2023-05-09 ENCOUNTER — Other Ambulatory Visit (INDEPENDENT_AMBULATORY_CARE_PROVIDER_SITE_OTHER): Payer: Self-pay | Admitting: Gastroenterology

## 2023-05-09 DIAGNOSIS — R1013 Epigastric pain: Secondary | ICD-10-CM

## 2023-05-09 DIAGNOSIS — R1010 Upper abdominal pain, unspecified: Secondary | ICD-10-CM

## 2023-05-10 LAB — H. PYLORI BREATH TEST: H pylori Breath Test: NEGATIVE

## 2023-05-13 ENCOUNTER — Encounter (INDEPENDENT_AMBULATORY_CARE_PROVIDER_SITE_OTHER): Payer: Self-pay | Admitting: *Deleted

## 2023-05-13 ENCOUNTER — Encounter (INDEPENDENT_AMBULATORY_CARE_PROVIDER_SITE_OTHER): Payer: Self-pay

## 2023-05-14 ENCOUNTER — Telehealth (INDEPENDENT_AMBULATORY_CARE_PROVIDER_SITE_OTHER): Payer: Self-pay | Admitting: Gastroenterology

## 2023-05-14 ENCOUNTER — Encounter (INDEPENDENT_AMBULATORY_CARE_PROVIDER_SITE_OTHER): Payer: Self-pay

## 2023-05-14 NOTE — Telephone Encounter (Signed)
 Pt needing CT scan (see pathology results from EGD).  [employer] Online Certification Process Submission - Precertification Request Thank you for completing this online certification request on behalf of patient Reginald [name] on May 09, 2023 9:35 AM EST. You may print this page for your records.   IMPORTANT: Please read.   This certification process will not be finalized until the clinical information submitted has been reviewed. You will be contacted regarding the outcome of this request within 48 hours. If additional information is needed to complete the review, you will be contacted within one business day. To check on the status of this request, please contact [employer] at [phone] and have the participant's name and date of birth available for reference. This review and determination is limited to medical necessity only. Accordingly, this determination does not guarantee payment of charges. Payment of benefits will be subject to all of the patient's health plan conditions, limitations and exclusions affecting coverage, as well as the patient's eligibility on the date services are actually provided. To verify eligibility and benefits, please call the Customer Service number located on the ID Card.  Fax received from [facility] stating unable to locate patient. Please provide a legible copy of the patients insurance card with the web request. Please fax to [phone].

## 2023-05-15 ENCOUNTER — Other Ambulatory Visit (INDEPENDENT_AMBULATORY_CARE_PROVIDER_SITE_OTHER): Payer: Self-pay | Admitting: Gastroenterology

## 2023-05-15 DIAGNOSIS — K529 Noninfective gastroenteritis and colitis, unspecified: Secondary | ICD-10-CM

## 2023-05-15 NOTE — Telephone Encounter (Signed)
 Last OV visit 04/01/23. Note states Diarrhea: well managed with lomotil  BID. Continue med and follow up in 3 months.

## 2023-05-24 NOTE — Telephone Encounter (Signed)
 Spoke to insurance and they state that pt was not pulling up by member ID. Rep was able to put pt in system and place a pre note and get pt certified from 05/24/23-01/22/24 for CT. Reference number [id number]. Rep states that Cts only need a pre note Pt scheduled for 07/05/23 at 5pm at [facility]. Left message to return call. Will send appt letter.

## 2023-05-25 ENCOUNTER — Other Ambulatory Visit (INDEPENDENT_AMBULATORY_CARE_PROVIDER_SITE_OTHER): Payer: Self-pay | Admitting: Gastroenterology

## 2023-05-28 ENCOUNTER — Encounter (HOSPITAL_COMMUNITY): Payer: Self-pay

## 2023-05-28 ENCOUNTER — Inpatient Hospital Stay (HOSPITAL_COMMUNITY)
Admission: EM | Admit: 2023-05-28 | Discharge: 2023-06-06 | DRG: 234 | Disposition: A | Discharge status: Home / Self Care | Attending: Thoracic Surgery (Cardiothoracic Vascular Surgery) | Admitting: Thoracic Surgery (Cardiothoracic Vascular Surgery)

## 2023-05-28 ENCOUNTER — Emergency Department (HOSPITAL_COMMUNITY)

## 2023-05-28 ENCOUNTER — Other Ambulatory Visit: Payer: Self-pay

## 2023-05-28 DIAGNOSIS — D62 Acute posthemorrhagic anemia: Secondary | ICD-10-CM | POA: Diagnosis not present

## 2023-05-28 DIAGNOSIS — Z79899 Other long term (current) drug therapy: Secondary | ICD-10-CM

## 2023-05-28 DIAGNOSIS — Z716 Tobacco abuse counseling: Secondary | ICD-10-CM

## 2023-05-28 DIAGNOSIS — Z8261 Family history of arthritis: Secondary | ICD-10-CM

## 2023-05-28 DIAGNOSIS — Z720 Tobacco use: Secondary | ICD-10-CM | POA: Diagnosis not present

## 2023-05-28 DIAGNOSIS — J9382 Other air leak: Secondary | ICD-10-CM | POA: Diagnosis not present

## 2023-05-28 DIAGNOSIS — Y832 Surgical operation with anastomosis, bypass or graft as the cause of abnormal reaction of the patient, or of later complication, without mention of misadventure at the time of the procedure: Secondary | ICD-10-CM | POA: Diagnosis not present

## 2023-05-28 DIAGNOSIS — Z880 Allergy status to penicillin: Secondary | ICD-10-CM | POA: Diagnosis not present

## 2023-05-28 DIAGNOSIS — C3491 Malignant neoplasm of unspecified part of right bronchus or lung: Secondary | ICD-10-CM | POA: Diagnosis not present

## 2023-05-28 DIAGNOSIS — J90 Pleural effusion, not elsewhere classified: Secondary | ICD-10-CM | POA: Diagnosis not present

## 2023-05-28 DIAGNOSIS — I10 Essential (primary) hypertension: Secondary | ICD-10-CM | POA: Diagnosis present

## 2023-05-28 DIAGNOSIS — J439 Emphysema, unspecified: Secondary | ICD-10-CM | POA: Diagnosis present

## 2023-05-28 DIAGNOSIS — Y718 Miscellaneous cardiovascular devices associated with adverse incidents, not elsewhere classified: Secondary | ICD-10-CM | POA: Diagnosis not present

## 2023-05-28 DIAGNOSIS — K219 Gastro-esophageal reflux disease without esophagitis: Secondary | ICD-10-CM | POA: Diagnosis present

## 2023-05-28 DIAGNOSIS — K589 Irritable bowel syndrome without diarrhea: Secondary | ICD-10-CM | POA: Diagnosis present

## 2023-05-28 DIAGNOSIS — F17210 Nicotine dependence, cigarettes, uncomplicated: Secondary | ICD-10-CM | POA: Diagnosis present

## 2023-05-28 DIAGNOSIS — I9789 Other postprocedural complications and disorders of the circulatory system, not elsewhere classified: Secondary | ICD-10-CM | POA: Diagnosis not present

## 2023-05-28 DIAGNOSIS — D72829 Elevated white blood cell count, unspecified: Secondary | ICD-10-CM | POA: Diagnosis not present

## 2023-05-28 DIAGNOSIS — C3411 Malignant neoplasm of upper lobe, right bronchus or lung: Secondary | ICD-10-CM | POA: Diagnosis present

## 2023-05-28 DIAGNOSIS — J939 Pneumothorax, unspecified: Secondary | ICD-10-CM | POA: Diagnosis not present

## 2023-05-28 DIAGNOSIS — L7632 Postprocedural hematoma of skin and subcutaneous tissue following other procedure: Secondary | ICD-10-CM | POA: Diagnosis not present

## 2023-05-28 DIAGNOSIS — M1990 Unspecified osteoarthritis, unspecified site: Secondary | ICD-10-CM | POA: Diagnosis present

## 2023-05-28 DIAGNOSIS — J9811 Atelectasis: Secondary | ICD-10-CM | POA: Diagnosis not present

## 2023-05-28 DIAGNOSIS — M7989 Other specified soft tissue disorders: Secondary | ICD-10-CM | POA: Diagnosis not present

## 2023-05-28 DIAGNOSIS — I25119 Atherosclerotic heart disease of native coronary artery with unspecified angina pectoris: Secondary | ICD-10-CM | POA: Diagnosis not present

## 2023-05-28 DIAGNOSIS — Z9889 Other specified postprocedural states: Secondary | ICD-10-CM

## 2023-05-28 DIAGNOSIS — Z833 Family history of diabetes mellitus: Secondary | ICD-10-CM

## 2023-05-28 DIAGNOSIS — R079 Chest pain, unspecified: Secondary | ICD-10-CM | POA: Diagnosis not present

## 2023-05-28 DIAGNOSIS — Z01810 Encounter for preprocedural cardiovascular examination: Secondary | ICD-10-CM | POA: Diagnosis not present

## 2023-05-28 DIAGNOSIS — I200 Unstable angina: Secondary | ICD-10-CM | POA: Diagnosis present

## 2023-05-28 DIAGNOSIS — I700 Atherosclerosis of aorta: Secondary | ICD-10-CM | POA: Diagnosis present

## 2023-05-28 DIAGNOSIS — K2100 Gastro-esophageal reflux disease with esophagitis, without bleeding: Secondary | ICD-10-CM

## 2023-05-28 DIAGNOSIS — Z951 Presence of aortocoronary bypass graft: Secondary | ICD-10-CM

## 2023-05-28 DIAGNOSIS — K5732 Diverticulitis of large intestine without perforation or abscess without bleeding: Secondary | ICD-10-CM | POA: Diagnosis present

## 2023-05-28 DIAGNOSIS — Y92239 Unspecified place in hospital as the place of occurrence of the external cause: Secondary | ICD-10-CM | POA: Diagnosis not present

## 2023-05-28 DIAGNOSIS — E785 Hyperlipidemia, unspecified: Secondary | ICD-10-CM | POA: Diagnosis present

## 2023-05-28 DIAGNOSIS — I255 Ischemic cardiomyopathy: Secondary | ICD-10-CM | POA: Diagnosis present

## 2023-05-28 DIAGNOSIS — K580 Irritable bowel syndrome with diarrhea: Secondary | ICD-10-CM | POA: Diagnosis not present

## 2023-05-28 DIAGNOSIS — Z7982 Long term (current) use of aspirin: Secondary | ICD-10-CM

## 2023-05-28 DIAGNOSIS — R911 Solitary pulmonary nodule: Secondary | ICD-10-CM

## 2023-05-28 DIAGNOSIS — Z8249 Family history of ischemic heart disease and other diseases of the circulatory system: Secondary | ICD-10-CM | POA: Diagnosis not present

## 2023-05-28 DIAGNOSIS — I25110 Atherosclerotic heart disease of native coronary artery with unstable angina pectoris: Principal | ICD-10-CM | POA: Diagnosis present

## 2023-05-28 DIAGNOSIS — I97631 Postprocedural hematoma of a circulatory system organ or structure following cardiac bypass: Secondary | ICD-10-CM | POA: Diagnosis not present

## 2023-05-28 DIAGNOSIS — M79602 Pain in left arm: Secondary | ICD-10-CM | POA: Diagnosis present

## 2023-05-28 DIAGNOSIS — I2510 Atherosclerotic heart disease of native coronary artery without angina pectoris: Secondary | ICD-10-CM | POA: Diagnosis not present

## 2023-05-28 DIAGNOSIS — Z7984 Long term (current) use of oral hypoglycemic drugs: Secondary | ICD-10-CM

## 2023-05-28 DIAGNOSIS — M549 Dorsalgia, unspecified: Secondary | ICD-10-CM | POA: Diagnosis not present

## 2023-05-28 DIAGNOSIS — M79601 Pain in right arm: Secondary | ICD-10-CM | POA: Diagnosis not present

## 2023-05-28 DIAGNOSIS — D696 Thrombocytopenia, unspecified: Secondary | ICD-10-CM | POA: Diagnosis not present

## 2023-05-28 DIAGNOSIS — Z7902 Long term (current) use of antithrombotics/antiplatelets: Secondary | ICD-10-CM

## 2023-05-28 DIAGNOSIS — G43909 Migraine, unspecified, not intractable, without status migrainosus: Secondary | ICD-10-CM | POA: Diagnosis present

## 2023-05-28 DIAGNOSIS — I721 Aneurysm of artery of upper extremity: Secondary | ICD-10-CM | POA: Diagnosis present

## 2023-05-28 DIAGNOSIS — I70208 Unspecified atherosclerosis of native arteries of extremities, other extremity: Secondary | ICD-10-CM | POA: Diagnosis present

## 2023-05-28 DIAGNOSIS — M25531 Pain in right wrist: Secondary | ICD-10-CM | POA: Diagnosis not present

## 2023-05-28 HISTORY — DX: Gastro-esophageal reflux disease without esophagitis: K21.9

## 2023-05-28 HISTORY — DX: Irritable bowel syndrome, unspecified: K58.9

## 2023-05-28 LAB — COMPREHENSIVE METABOLIC PANEL WITH GFR
ALT: 10 U/L (ref 0–44)
AST: 17 U/L (ref 15–41)
Albumin: 4 g/dL (ref 3.5–5.0)
Alkaline Phosphatase: 78 U/L (ref 38–126)
Anion gap: 8 (ref 5–15)
BUN: 12 mg/dL (ref 6–20)
CO2: 24 mmol/L (ref 22–32)
Calcium: 8.8 mg/dL — ABNORMAL LOW (ref 8.9–10.3)
Chloride: 104 mmol/L (ref 98–111)
Creatinine, Ser: 0.94 mg/dL (ref 0.61–1.24)
GFR, Estimated: >60 mL/min (ref >60)
Glucose, Bld: 120 mg/dL — ABNORMAL HIGH (ref 70–99)
Potassium: 3.8 mmol/L (ref 3.5–5.1)
Sodium: 136 mmol/L (ref 135–145)
Total Bilirubin: 0.4 mg/dL (ref 0.0–1.2)
Total Protein: 6.9 g/dL (ref 6.5–8.1)

## 2023-05-28 LAB — APTT: aPTT: 31 s (ref 24–36)

## 2023-05-28 LAB — CBC WITH DIFFERENTIAL/PLATELET
Abs Immature Granulocytes: 0.02 10*3/uL (ref 0.00–0.07)
Basophils Absolute: 0.1 10*3/uL (ref 0.0–0.1)
Basophils Relative: 1 %
Eosinophils Absolute: 0.2 10*3/uL (ref 0.0–0.5)
Eosinophils Relative: 3 %
HCT: 44.7 % (ref 39.0–52.0)
Hemoglobin: 15.3 g/dL (ref 13.0–17.0)
Immature Granulocytes: 0 %
Lymphocytes Relative: 31 %
Lymphs Abs: 2.5 10*3/uL (ref 0.7–4.0)
MCH: 31.7 pg (ref 26.0–34.0)
MCHC: 34.2 g/dL (ref 30.0–36.0)
MCV: 92.7 fL (ref 80.0–100.0)
Monocytes Absolute: 0.6 10*3/uL (ref 0.1–1.0)
Monocytes Relative: 8 %
Neutro Abs: 4.7 10*3/uL (ref 1.7–7.7)
Neutrophils Relative %: 57 %
Platelets: 204 10*3/uL (ref 150–400)
RBC: 4.82 MIL/uL (ref 4.22–5.81)
RDW: 12.7 % (ref 11.5–15.5)
WBC: 8.2 10*3/uL (ref 4.0–10.5)
nRBC: 0 % (ref 0.0–0.2)

## 2023-05-28 LAB — BRAIN NATRIURETIC PEPTIDE: B Natriuretic Peptide: 175 pg/mL — ABNORMAL HIGH (ref 0.0–100.0)

## 2023-05-28 LAB — PROTIME-INR
INR: 1 (ref 0.8–1.2)
Prothrombin Time: 13.3 s (ref 11.4–15.2)

## 2023-05-28 LAB — TROPONIN I (HIGH SENSITIVITY)
Troponin I (High Sensitivity): 17 ng/L (ref <18)
Troponin I (High Sensitivity): 15 ng/L (ref <18)

## 2023-05-28 LAB — LIPASE, BLOOD: Lipase: 30 U/L (ref 11–51)

## 2023-05-28 MED ORDER — ACETAMINOPHEN 325 MG PO TABS: 650.0000 mg | ORAL_TABLET | ORAL | Status: DC | PRN

## 2023-05-28 MED ORDER — AMLODIPINE BESYLATE 5 MG PO TABS
5.0000 mg | ORAL_TABLET | Freq: Every day | ORAL | Status: DC
  Administered 2023-05-28 – 2023-05-29 (×2): 5 mg via ORAL
  Filled 2023-05-28 (×2): qty 1

## 2023-05-28 MED ORDER — PROMETHAZINE HCL 12.5 MG PO TABS: 12.5000 mg | ORAL_TABLET | Freq: Four times a day (QID) | ORAL | Status: DC | PRN

## 2023-05-28 MED ORDER — ONDANSETRON HCL 4 MG/2ML IJ SOLN
4.0000 mg | Freq: Four times a day (QID) | INTRAMUSCULAR | Status: DC | PRN
  Filled 2023-05-28: qty 2

## 2023-05-28 MED ORDER — HEPARIN (PORCINE) 25000 UT/250ML-% IV SOLN
1050.0000 [IU]/h | INTRAVENOUS | Status: DC
  Administered 2023-05-28: 900 [IU]/h via INTRAVENOUS
  Administered 2023-05-29: 1050 [IU]/h via INTRAVENOUS
  Filled 2023-05-28 (×2): qty 250

## 2023-05-28 MED ORDER — PANTOPRAZOLE SODIUM 40 MG PO TBEC
40.0000 mg | DELAYED_RELEASE_TABLET | Freq: Every day | ORAL | Status: DC
  Administered 2023-05-29 – 2023-05-30 (×2): 40 mg via ORAL
  Filled 2023-05-28 (×2): qty 1

## 2023-05-28 MED ORDER — DIPHENOXYLATE-ATROPINE 2.5-0.025 MG PO TABS
1.0000 | ORAL_TABLET | Freq: Four times a day (QID) | ORAL | Status: DC | PRN
  Administered 2023-05-28 – 2023-05-29 (×3): 1 via ORAL
  Filled 2023-05-28 (×3): qty 1

## 2023-05-28 MED ORDER — ASPIRIN 325 MG PO TABS
325.0000 mg | ORAL_TABLET | Freq: Every day | ORAL | Status: DC
  Administered 2023-05-28: 325 mg via ORAL
  Filled 2023-05-28: qty 1

## 2023-05-28 MED ORDER — TRAMADOL HCL 50 MG PO TABS: 50.0000 mg | ORAL_TABLET | Freq: Four times a day (QID) | ORAL | Status: DC | PRN

## 2023-05-28 MED ORDER — NITROGLYCERIN 0.4 MG SL SUBL
0.4000 mg | SUBLINGUAL_TABLET | SUBLINGUAL | Status: AC | PRN
  Administered 2023-05-28 – 2023-05-29 (×3): 0.4 mg via SUBLINGUAL
  Filled 2023-05-28: qty 1

## 2023-05-28 MED ORDER — ASPIRIN 81 MG PO TBEC
81.0000 mg | DELAYED_RELEASE_TABLET | Freq: Every day | ORAL | Status: DC
  Administered 2023-05-29 – 2023-05-31 (×3): 81 mg via ORAL
  Filled 2023-05-28 (×3): qty 1

## 2023-05-28 MED ORDER — NICOTINE 14 MG/24HR TD PT24
14.0000 mg | MEDICATED_PATCH | Freq: Every day | TRANSDERMAL | Status: DC
  Administered 2023-05-28: 14 mg via TRANSDERMAL
  Filled 2023-05-28: qty 1

## 2023-05-28 MED ORDER — NICOTINE 21 MG/24HR TD PT24
21.0000 mg | MEDICATED_PATCH | Freq: Once | TRANSDERMAL | Status: DC
  Administered 2023-05-28: 21 mg via TRANSDERMAL
  Filled 2023-05-28: qty 1

## 2023-05-28 MED ORDER — DICYCLOMINE HCL 10 MG PO CAPS
10.0000 mg | ORAL_CAPSULE | Freq: Two times a day (BID) | ORAL | Status: AC | PRN
Start: 2023-05-28 — End: ?
  Administered 2023-05-28 – 2023-05-29 (×3): 10 mg via ORAL
  Filled 2023-05-28 (×3): qty 1

## 2023-05-28 MED ORDER — NICOTINE POLACRILEX 2 MG MT GUM
2.0000 mg | CHEWING_GUM | OROMUCOSAL | Status: DC | PRN
  Administered 2023-05-29: 2 mg via ORAL
  Filled 2023-05-28 (×3): qty 1

## 2023-05-28 MED ORDER — HEPARIN BOLUS VIA INFUSION
3800.0000 [IU] | Freq: Once | INTRAVENOUS | Status: AC
  Administered 2023-05-28: 3800 [IU] via INTRAVENOUS

## 2023-05-28 MED ORDER — IOHEXOL 350 MG/ML SOLN
100.0000 mL | Freq: Once | INTRAVENOUS | Status: AC | PRN
  Administered 2023-05-28: 100 mL via INTRAVENOUS

## 2023-05-28 NOTE — H&P (Signed)
 History and Physical    Reginald [name] XBJ:[id number] DOB: [birth year] DOA: 05/28/2023  PCP: Cynda Drafts, FNP Patient coming from: Home  Chief Complaint: CP  HPI: Reginald [name] is a 58 y.o. male with medical history significant of iarthritis, GERD,and IBS.   CC: Intermittent CP. Exertional and at rest. Ongoing for several weeks. Not always relieved w/ rest. Episodes last several minutes but less than an hour. Gettign worse intensity and more frequent. Associated w/ L neck and L arm pain and nause and diaphoresis and a sensation of being SOB.    ED Course: Cardiology called for possible ACS. Pt started on ASA and nitro w/ improvement in CP.   Review of Systems: As per HPI otherwise all other systems reviewed and are negative  Ambulatory Status:no restrictions.   Past Medical History:  Diagnosis Date   Arthritis    GERD (gastroesophageal reflux disease)    Headache    migraines    IBS (irritable bowel syndrome)     Past Surgical History:  Procedure Laterality Date   BIOPSY  11/17/2021   Procedure: BIOPSY;  Surgeon: Urban Garden, MD;  [facility];  Service: Gastroenterology;;   COLONOSCOPY WITH PROPOFOL  N/A 11/17/2021   Procedure: COLONOSCOPY WITH PROPOFOL ;  Surgeon: Urban Garden, MD;  [facility];  Service: Gastroenterology;  Laterality: N/A;  200 ASA 2   ESOPHAGOGASTRODUODENOSCOPY N/A 04/24/2023   Procedure: EGD (ESOPHAGOGASTRODUODENOSCOPY);  Surgeon: Umberto Ganong, Bearl Limes, MD;  [facility]: [facility];  Service: Gastroenterology;  Laterality: N/A;  9:15AM;ASA 2   ESOPHAGOGASTRODUODENOSCOPY (EGD) WITH PROPOFOL  N/A 11/17/2021   Procedure: ESOPHAGOGASTRODUODENOSCOPY (EGD) WITH PROPOFOL ;  Surgeon: Urban Garden, MD;  [facility];  Service: Gastroenterology;  Laterality: N/A;   INGUINAL HERNIA REPAIR Right 06/02/2015   Procedure: RIGHT INGUINAL HERNIA REPAIR WITH MESH;  Surgeon: Aldean Hummingbird, MD;  Location:  [facility] OR;  Service: General;  Laterality: Right;   INSERTION OF MESH Right 06/02/2015   Procedure: INSERTION OF MESH;  Surgeon: Aldean Hummingbird, MD;  Location: [facility] OR;  Service: General;  Laterality: Right;   NO PAST SURGERIES     POLYPECTOMY  11/17/2021   Procedure: POLYPECTOMY;  Surgeon: Urban Garden, MD;  [facility]: [facility];  Service: Gastroenterology;;    Social History   Socioeconomic History   Marital status: Significant Other    Spouse name: Not on file   Number of children: Not on file   Years of education: Not on file   Highest education level: Not on file  Occupational History   Not on file  Tobacco Use   Smoking status: Heavy Smoker    Current packs/day: 1.50    Types: Cigarettes    Passive exposure: Current   Smokeless tobacco: Former  Advertising account planner   Vaping status: Never Used  Substance and Sexual Activity   Alcohol use: No    Alcohol/week: 0.0 standard drinks of alcohol    Comment: formerly a heavy drinker, quit 2002   Drug use: No   Sexual activity: Not on file  Other Topics Concern   Not on file  Social History Narrative   Not on file   Social Drivers of Health   Financial Resource Strain: Low Risk  (03/20/2023)   Received from [facility]   Overall Financial Resource Strain (CARDIA)    Difficulty of Paying Living Expenses: Not hard at all  Food Insecurity: Low Risk  (05/03/2023)   Received from [facility]  Hunger Vital Sign    Worried About Running Out of Food in the Last Year: Never true    Ran Out of Food in the Last Year: Never true  Transportation Needs: No Transportation Needs (05/03/2023)   Received from [facility]    In the past 12 months, has lack of reliable transportation kept you from medical appointments, meetings, work or from getting things needed for daily living? : No  Physical Activity: Sufficiently Active (03/20/2023)   Received from [facility]   Exercise Vital Sign    Days of Exercise per  Week: 7 days    Minutes of Exercise per Session: 30 min  Stress: No Stress Concern Present (03/20/2023)   Received from [facility] - Occupational Stress Questionnaire    Feeling of Stress : Only a little  Social Connections: Moderately Integrated (03/20/2023)   Received from [facility]   Social Connection and Isolation Panel [NHANES]    Frequency of Communication with Friends and Family: More than three times a week    Frequency of Social Gatherings with Friends and Family: Never    Attends Religious Services: More than 4 times per year    Active Member of [employer] or Organizations: No    Attends Banker Meetings: Never    Marital Status: Living with partner  Intimate Partner Violence: Not At Risk (03/20/2023)   Received from [facility]   Humiliation, Afraid, Rape, and Kick questionnaire    Fear of Current or Ex-Partner: No    Emotionally Abused: No    Physically Abused: No    Sexually Abused: No    Allergies  Allergen Reactions   Penicillins Rash    Immediate rash, facial/tongue/throat swelling, SOB or lightheadedness with hypotension     Family History  Problem Relation Age of Onset   Diabetes Mother    Osteoarthritis Mother    Heart disease Father       Prior to Admission medications   Medication Sig Start Date End Date Taking? Authorizing Provider  dicyclomine  (BENTYL ) 10 MG capsule TAKE 1 CAPSULE (10 MG TOTAL) BY MOUTH EVERY 12 (TWELVE) HOURS AS NEEDED (ABDOMINAL PAIN). 02/21/23  Yes Urban Garden, MD  diphenoxylate -atropine  (LOMOTIL ) 2.5-0.025 MG tablet TAKE 1 TABLET BY MOUTH 4 (FOUR) TIMES DAILY AS NEEDED FOR DIARRHEA OR LOOSE STOOLS. 05/15/23  Yes Carlan, Chelsea L, NP  MAGNESIUM PO Take 1 tablet by mouth at bedtime.   Yes [provider]  Multiple Vitamin (MULTIVITAMIN) tablet Take 1 tablet by mouth at bedtime.   Yes [provider]  omeprazole  (PRILOSEC) 40 MG capsule  Take 1 capsule (40 mg total) by mouth daily. 04/24/23  Yes Urban Garden, MD  ondansetron  (ZOFRAN ) 4 MG tablet TAKE 1 TABLET BY MOUTH EVERY 8 HOURS AS NEEDED FOR NAUSEA AND VOMITING 01/25/23  Yes Carlan, Chelsea L, NP  promethazine  (PHENERGAN ) 12.5 MG tablet TAKE 1 TABLET BY MOUTH EVERY 6 HOURS AS NEEDED FOR NAUSEA OR VOMITING. 05/27/23  Yes Carlan, Chelsea L, NP  sucralfate  (CARAFATE ) 1 g tablet Take 1 tablet (1 g total) by mouth 4 (four) times daily -  with meals and at bedtime. Dissolve tablet in 30ml of water, then drink slurry 04/02/23  Yes Carlan, Chelsea L, NP  traMADol (ULTRAM) 50 MG tablet Take 50 mg by mouth every 6 (six) hours as needed for moderate pain (pain score 4-6). 03/21/23  Yes [provider]  Physical Exam: Vitals:   05/28/23 1344 05/28/23 1445 05/28/23 1830 05/28/23 1948  BP: (!) 162/101 123/78 136/84 (!) 150/96  Pulse: 91 79 85 82  Resp: 18 20 17 19   Temp: 97.6 F (36.4 C)   97.9 F (36.6 C)  TempSrc: Oral   Oral  SpO2: 100% 96% 94% 97%  Weight:    58.1 kg  Height:    5\' 8"  (1.727 m)     General:  Appears calm and comfortable Eyes:  PERRL, EOMI, normal lids, iris ENT:  grossly normal hearing, lips & tongue, mmm Neck:  no LAD, masses or thyromegaly Cardiovascular: faint heart sounds. Rrr. no m/r/g. No LE edema.  Respiratory:  CTA bilaterally, no w/r/r. Normal respiratory effort. Abdomen:  soft, ntnd, NABS Skin:  no rash or induration seen on limited exam Musculoskeletal:  grossly normal tone BUE/BLE, good ROM, no bony abnormality Psychiatric:  grossly normal mood and affect, speech fluent and appropriate, AOx3 Neurologic:  CN 2-12 grossly intact, moves all extremities in coordinated fashion, sensation intact  Labs on Admission: I have personally reviewed following labs and imaging studies  CBC: Recent Labs  Lab 05/28/23 1418  WBC 8.2  NEUTROABS 4.7  HGB 15.3  HCT 44.7  MCV 92.7  PLT 204   Basic Metabolic Panel: Recent Labs  Lab  05/28/23 1418  NA 136  K 3.8  CL 104  CO2 24  GLUCOSE 120*  BUN 12  CREATININE 0.94  CALCIUM 8.8*   GFR: Estimated Creatinine Clearance: 71.3 mL/min (by C-G formula based on SCr of 0.94 mg/dL). Liver Function Tests: Recent Labs  Lab 05/28/23 1418  AST 17  ALT 10  ALKPHOS 78  BILITOT 0.4  PROT 6.9  ALBUMIN 4.0   Recent Labs  Lab 05/28/23 1418  LIPASE 30   No results for input(s): "AMMONIA" in the last 168 hours. Coagulation Profile: Recent Labs  Lab 05/28/23 1418  INR 1.0   Cardiac Enzymes: No results for input(s): "CKTOTAL", "CKMB", "CKMBINDEX", "TROPONINI" in the last 168 hours. BNP (last 3 results) No results for input(s): "PROBNP" in the last 8760 hours. HbA1C: No results for input(s): "HGBA1C" in the last 72 hours. CBG: No results for input(s): "GLUCAP" in the last 168 hours. Lipid Profile: No results for input(s): "CHOL", "HDL", "LDLCALC", "TRIG", "CHOLHDL", "LDLDIRECT" in the last 72 hours. Thyroid Function Tests: No results for input(s): "TSH", "T4TOTAL", "FREET4", "T3FREE", "THYROIDAB" in the last 72 hours. Anemia Panel: No results for input(s): "VITAMINB12", "FOLATE", "FERRITIN", "TIBC", "IRON", "RETICCTPCT" in the last 72 hours. Urine analysis:    Component Value Date/Time   COLORURINE STRAW (A) 08/16/2021 1435   APPEARANCEUR CLEAR 08/16/2021 1435   APPEARANCEUR Clear 06/10/2017 1304   LABSPEC 1.004 (L) 08/16/2021 1435   PHURINE 6.0 08/16/2021 1435   GLUCOSEU NEGATIVE 08/16/2021 1435   HGBUR NEGATIVE 08/16/2021 1435   BILIRUBINUR NEGATIVE 08/16/2021 1435   BILIRUBINUR Negative 06/10/2017 1304   KETONESUR NEGATIVE 08/16/2021 1435   PROTEINUR NEGATIVE 08/16/2021 1435   NITRITE NEGATIVE 08/16/2021 1435   LEUKOCYTESUR NEGATIVE 08/16/2021 1435    Creatinine Clearance: Estimated Creatinine Clearance: 71.3 mL/min (by C-G formula based on SCr of 0.94 mg/dL).  Sepsis Labs: @LABRCNTIP (procalcitonin:4,lacticidven:4) )No results found for this or  any previous visit (from the past 240 hours).   Radiological Exams on Admission: CT ANGIO CHEST/ABD/PEL FOR DISSECTION W &/OR WO CONTRAST Result Date: 05/28/2023 CLINICAL DATA:  Persistent chest pain radiating to arm * Tracking Code: BO * EXAM: CT ANGIOGRAPHY CHEST, ABDOMEN AND PELVIS  TECHNIQUE: Non-contrast CT of the chest was initially obtained. Multidetector CT imaging through the chest, abdomen and pelvis was performed using the standard protocol during bolus administration of intravenous contrast. Multiplanar reconstructed images and MIPs were obtained and reviewed to evaluate the vascular anatomy. RADIATION DOSE REDUCTION: This exam was performed according to the departmental dose-optimization program which includes automated exposure control, adjustment of the mA and/or kV according to patient size and/or use of iterative reconstruction technique. CONTRAST:  OMNIPAQUE  IOHEXOL  350 MG/ML SOLN COMPARISON:  None Available. FINDINGS: CTA CHEST FINDINGS VASCULAR Aorta: Satisfactory opacification of the aorta. Normal contour and caliber of the thoracic aorta. No evidence of aneurysm, dissection, or other acute aortic pathology. Mild mixed calcific atherosclerosis of the thoracic aorta. Cardiovascular: No evidence of pulmonary embolism on limited non-tailored examination. Normal heart size. Left coronary artery calcifications. No pericardial effusion. Review of the MIP images confirms the above findings. NON VASCULAR Mediastinum/Nodes: No enlarged mediastinal, hilar, or axillary lymph nodes. Thyroid gland, trachea, and esophagus demonstrate no significant findings. Lungs/Pleura: Severe emphysema. Diffuse bilateral bronchial wall thickening. Irregular nodule of the right pulmonary apex measuring 1.0 x 0.5 cm (series 7, image 25). No pleural effusion or pneumothorax. Musculoskeletal: No chest wall abnormality. No acute osseous findings. Review of the MIP images confirms the above findings. CTA ABDOMEN AND  PELVIS FINDINGS VASCULAR Normal caliber of the abdominal aorta. Severe, irregular atherosclerosis of the infrarenal abdominal aorta. No evidence of aneurysm, dissection, or other acute aortic pathology. Duplicated bilateral renal arteries with otherwise standard branching pattern of the abdominal aorta. Review of the MIP images confirms the above findings. NON-VASCULAR Hepatobiliary: No solid liver abnormality is seen. Contracted gallbladder. No gallstones, gallbladder wall thickening, or biliary dilatation. Pancreas: Unremarkable. No pancreatic ductal dilatation or surrounding inflammatory changes. Spleen: Normal in size without significant abnormality. Adrenals/Urinary Tract: Adrenal glands are unremarkable. Kidneys are normal, without renal calculi, solid lesion, or hydronephrosis. Bladder is unremarkable. Stomach/Bowel: Stomach is within normal limits. Appendix appears normal. Descending and sigmoid diverticulosis. Severe, chronic wall thickening of the sigmoid colon (series 5, image 179). Lymphatic: No enlarged abdominal or pelvic lymph nodes. Reproductive: No mass or other significant abnormality. Other: No abdominal wall hernia or abnormality. No ascites. Musculoskeletal: No acute osseous findings. IMPRESSION: 1. Normal contour and caliber of the thoracic and abdominal aorta. Severe, irregular atherosclerosis of the infrarenal abdominal aorta. No evidence of aneurysm, dissection, or other acute aortic pathology. 2. Irregular nodule of the right pulmonary apex measuring 1.0 x 0.5 cm. This is modestly suspicious for primary lung malignancy. By Fleischner Society criteria Consider one of the following in 3 months for both low-risk and high-risk individuals: (a) repeat chest CT, (b) follow-up PET-CT, or (c) tissue sampling. This recommendation follows the consensus statement: Guidelines for Management of Incidental Pulmonary Nodules Detected on CT Images: From the Fleischner Society 2017; Radiology 2017;  284:228-243. 3. Severe emphysema and diffuse bilateral bronchial wall thickening. 4. Descending and sigmoid diverticulosis. Severe, chronic wall thickening of the sigmoid colon, most consistent with chronic sequelae of diverticulitis. 5. Coronary artery disease. Aortic Atherosclerosis (ICD10-I70.0) and Emphysema (ICD10-J43.9). Electronically Signed   By: Fredricka Jenny M.D.   On: 05/28/2023 15:19    EKG: Independently reviewed. Diffuse ST depression. Sinus.   Assessment/Plan Principal Problem:   Unstable angina (HCC) Active Problems:   Gastroesophageal reflux disease   Pulmonary nodule   Essential hypertension   IBS (irritable bowel syndrome)   Unstable Angina: Trop negative and EKG unchanged (or improved) from prior. Sx concerning for  CAD and at risk for ACS.  - ASA, Heparin - CT cardiac  - LHC per cards - Echo - FLP, A1c.   Pulm Nodule: R apex. Concerning for malignancy. Pt w/ >60pack years of tobacco use. Asymptomatic. New finding.  - Pulm consult in am - consider IR consult if able to obtain biopsy.   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 No respiratory decompensation - tobacco cessation.   HTN: poorly controlled Per cards rec, start Norvasc 5mg   Tobacco use: currently smokes 1.5ppd (as many as 2.5ppd). 60+pack years.  - Nicotine patch.   IBS/GERD - continue Bentyl ,PPI   DVT prophylaxis: Hep  Code Status: full  Family Communication: Girlfriend  Disposition Plan: pending CAD and Pulm nodule workup  Consults called: Cards.   Admission status: Inpt    Otilia Bloch MD [facility]  If 7PM-7AM, please contact night-coverage [email or web address] Password [facility]  05/28/2023, 8:49 PM

## 2023-05-28 NOTE — ED Provider Notes (Signed)
 [place] [facility] AT [facility] Provider Note   CSN: [id number] Arrival date & time: 05/28/23  1335     History  Chief Complaint  Patient presents with   Chest Pain    Reginald [name] is a 58 y.o. male with PMH as listed below who presents with several weeks of intermittent chest pain. Comes with exertion but also at rest. No h/o similar. Endorses SOB and diaphoresis with nausea and vomiting when it arises. Radiates into his left neck, left chest, and left arm. Sometimes he feels his arm even go to sleep/numb. No trauma. No f/c, cough, recent illnesses, abdominal pain, leg swelling, LE numbness/tingling. 1.5 packers per day for 43 years (64.5 pack years). Recently had endoscopy/scope for melena in April with no further sxs since then. Doesn't take blood thinner.    Past Medical History:  Diagnosis Date   Arthritis    GERD (gastroesophageal reflux disease)    Headache    migraines    IBS (irritable bowel syndrome)        Home Medications Prior to Admission medications   Medication Sig Start Date End Date Taking? Authorizing Provider  dicyclomine  (BENTYL ) 10 MG capsule TAKE 1 CAPSULE (10 MG TOTAL) BY MOUTH EVERY 12 (TWELVE) HOURS AS NEEDED (ABDOMINAL PAIN). 02/21/23  Yes Urban Garden, MD  diphenoxylate -atropine  (LOMOTIL ) 2.5-0.025 MG tablet TAKE 1 TABLET BY MOUTH 4 (FOUR) TIMES DAILY AS NEEDED FOR DIARRHEA OR LOOSE STOOLS. 05/15/23  Yes Carlan, Chelsea L, NP  MAGNESIUM PO Take 1 tablet by mouth at bedtime.   Yes [provider]  Multiple Vitamin (MULTIVITAMIN) tablet Take 1 tablet by mouth at bedtime.   Yes [provider]  omeprazole  (PRILOSEC) 40 MG capsule Take 1 capsule (40 mg total) by mouth daily. 04/24/23  Yes Urban Garden, MD  ondansetron  (ZOFRAN ) 4 MG tablet TAKE 1 TABLET BY MOUTH EVERY 8 HOURS AS NEEDED FOR NAUSEA AND VOMITING 01/25/23  Yes Carlan, Chelsea L, NP  promethazine  (PHENERGAN ) 12.5 MG tablet TAKE 1  TABLET BY MOUTH EVERY 6 HOURS AS NEEDED FOR NAUSEA OR VOMITING. 05/27/23  Yes Carlan, Chelsea L, NP  sucralfate  (CARAFATE ) 1 g tablet Take 1 tablet (1 g total) by mouth 4 (four) times daily -  with meals and at bedtime. Dissolve tablet in 30ml of water, then drink slurry 04/02/23  Yes Carlan, Chelsea L, NP  traMADol (ULTRAM) 50 MG tablet Take 50 mg by mouth every 6 (six) hours as needed for moderate pain (pain score 4-6). 03/21/23  Yes [provider]      Allergies    Penicillins    Review of Systems   Review of Systems A 10 point review of systems was performed and is negative unless otherwise reported in HPI.  Physical Exam Updated Vital Signs BP (!) 150/96 (BP Location: Left Arm)   Pulse 82   Temp 97.9 F (36.6 C) (Oral)   Resp 19   Ht 5\' 8"  (1.727 m)   Wt 58.1 kg   SpO2 97%   BMI 19.48 kg/m  Physical Exam General: Normal appearing male, lying in bed. Appears older than stated age. HEENT: PERRLA, Sclera anicteric, MMM, trachea midline.  Cardiology: RRR, no murmurs/rubs/gallops. BL radial and DP pulses equal bilaterally.  Resp: Normal respiratory rate and effort. CTAB, no wheezes, rhonchi, crackles.  Abd: Soft, non-tender, non-distended. No rebound tenderness or guarding.  GU: Deferred. MSK: No peripheral edema or signs of trauma. Skin: warm, dry.  Neuro: A&Ox4, CNs  II-XII grossly intact. MAEs. Sensation grossly intact.  Psych: Normal mood and affect.   ED Results / Procedures / Treatments   Labs (all labs ordered are listed, but only abnormal results are displayed) Labs Reviewed  COMPREHENSIVE METABOLIC PANEL WITH GFR - Abnormal; Notable for the following components:      Result Value   Glucose, Bld 120 (*)    Calcium 8.8 (*)    All other components within normal limits  BRAIN NATRIURETIC PEPTIDE - Abnormal; Notable for the following components:   B Natriuretic Peptide 175.0 (*)    All other components within normal limits  CBC WITH DIFFERENTIAL/PLATELET   LIPASE, BLOOD  PROTIME-INR  APTT  TROPONIN I (HIGH SENSITIVITY)  TROPONIN I (HIGH SENSITIVITY)    EKG EKG Interpretation Date/Time:  Tuesday May 28 2023 13:43:31 EDT Ventricular Rate:  88 PR Interval:  120 QRS Duration:  88 QT Interval:  332 QTC Calculation: 401 R Axis:   110  Text Interpretation: Normal sinus rhythm Right atrial enlargement Right axis deviation Pulmonary disease pattern  ST depressions/TWIs in infero lateral leads, similar to prior  Anterior elevations that were present on 04/24/23 EKG have improved    Confirmed by Annita Kindle [id number]) on 05/28/2023 7:54:40 PM  Radiology CT ANGIO CHEST/ABD/PEL FOR DISSECTION W &/OR WO CONTRAST Result Date: 05/28/2023 CLINICAL DATA:  Persistent chest pain radiating to arm * Tracking Code: BO * EXAM: CT ANGIOGRAPHY CHEST, ABDOMEN AND PELVIS TECHNIQUE: Non-contrast CT of the chest was initially obtained. Multidetector CT imaging through the chest, abdomen and pelvis was performed using the standard protocol during bolus administration of intravenous contrast. Multiplanar reconstructed images and MIPs were obtained and reviewed to evaluate the vascular anatomy. RADIATION DOSE REDUCTION: This exam was performed according to the departmental dose-optimization program which includes automated exposure control, adjustment of the mA and/or kV according to patient size and/or use of iterative reconstruction technique. CONTRAST:  OMNIPAQUE  IOHEXOL  350 MG/ML SOLN COMPARISON:  None Available. FINDINGS: CTA CHEST FINDINGS VASCULAR Aorta: Satisfactory opacification of the aorta. Normal contour and caliber of the thoracic aorta. No evidence of aneurysm, dissection, or other acute aortic pathology. Mild mixed calcific atherosclerosis of the thoracic aorta. Cardiovascular: No evidence of pulmonary embolism on limited non-tailored examination. Normal heart size. Left coronary artery calcifications. No pericardial effusion. Review of the MIP images confirms  the above findings. NON VASCULAR Mediastinum/Nodes: No enlarged mediastinal, hilar, or axillary lymph nodes. Thyroid gland, trachea, and esophagus demonstrate no significant findings. Lungs/Pleura: Severe emphysema. Diffuse bilateral bronchial wall thickening. Irregular nodule of the right pulmonary apex measuring 1.0 x 0.5 cm (series 7, image 25). No pleural effusion or pneumothorax. Musculoskeletal: No chest wall abnormality. No acute osseous findings. Review of the MIP images confirms the above findings. CTA ABDOMEN AND PELVIS FINDINGS VASCULAR Normal caliber of the abdominal aorta. Severe, irregular atherosclerosis of the infrarenal abdominal aorta. No evidence of aneurysm, dissection, or other acute aortic pathology. Duplicated bilateral renal arteries with otherwise standard branching pattern of the abdominal aorta. Review of the MIP images confirms the above findings. NON-VASCULAR Hepatobiliary: No solid liver abnormality is seen. Contracted gallbladder. No gallstones, gallbladder wall thickening, or biliary dilatation. Pancreas: Unremarkable. No pancreatic ductal dilatation or surrounding inflammatory changes. Spleen: Normal in size without significant abnormality. Adrenals/Urinary Tract: Adrenal glands are unremarkable. Kidneys are normal, without renal calculi, solid lesion, or hydronephrosis. Bladder is unremarkable. Stomach/Bowel: Stomach is within normal limits. Appendix appears normal. Descending and sigmoid diverticulosis. Severe, chronic wall thickening of the sigmoid colon (series  5, image 179). Lymphatic: No enlarged abdominal or pelvic lymph nodes. Reproductive: No mass or other significant abnormality. Other: No abdominal wall hernia or abnormality. No ascites. Musculoskeletal: No acute osseous findings. IMPRESSION: 1. Normal contour and caliber of the thoracic and abdominal aorta. Severe, irregular atherosclerosis of the infrarenal abdominal aorta. No evidence of aneurysm, dissection, or other  acute aortic pathology. 2. Irregular nodule of the right pulmonary apex measuring 1.0 x 0.5 cm. This is modestly suspicious for primary lung malignancy. By Fleischner Society criteria Consider one of the following in 3 months for both low-risk and high-risk individuals: (a) repeat chest CT, (b) follow-up PET-CT, or (c) tissue sampling. This recommendation follows the consensus statement: Guidelines for Management of Incidental Pulmonary Nodules Detected on CT Images: From the Fleischner Society 2017; Radiology 2017; 284:228-243. 3. Severe emphysema and diffuse bilateral bronchial wall thickening. 4. Descending and sigmoid diverticulosis. Severe, chronic wall thickening of the sigmoid colon, most consistent with chronic sequelae of diverticulitis. 5. Coronary artery disease. Aortic Atherosclerosis (ICD10-I70.0) and Emphysema (ICD10-J43.9). Electronically Signed   By: Fredricka Jenny M.D.   On: 05/28/2023 15:19    Procedures .Critical Care  Performed by: Merdis Stalling, MD Authorized by: Merdis Stalling, MD   Critical care provider statement:    Critical care time (minutes):  35   Critical care was necessary to treat or prevent imminent or life-threatening deterioration of the following conditions:  Cardiac failure   Critical care was time spent personally by me on the following activities:  Development of treatment plan with patient or surrogate, discussions with consultants, evaluation of patient's response to treatment, examination of patient, ordering and review of laboratory studies, ordering and review of radiographic studies, ordering and performing treatments and interventions, pulse oximetry, re-evaluation of patient's condition, review of old charts and obtaining history from patient or surrogate   Care discussed with: admitting provider       Medications Ordered in ED Medications  nitroGLYCERIN (NITROSTAT) SL tablet 0.4 mg (0.4 mg Sublingual Given 05/28/23 1443)  nicotine (NICODERM CQ -  dosed in mg/24 hours) patch 21 mg (21 mg Transdermal Patch Applied 05/28/23 1429)  aspirin EC tablet 81 mg (has no administration in time range)  amLODipine (NORVASC) tablet 5 mg (5 mg Oral Given 05/28/23 1705)  heparin bolus via infusion 3,800 Units (3,800 Units Intravenous Bolus from Bag 05/28/23 1706)    Followed by  heparin ADULT infusion 100 units/mL (25000 units/250mL) (900 Units/hr Intravenous New Bag/Given 05/28/23 1708)  iohexol  (OMNIPAQUE ) 350 MG/ML injection 100 mL (100 mLs Intravenous Contrast Given 05/28/23 1457)    ED Course/ Medical Decision Making/ A&P                          Medical Decision Making Amount and/or Complexity of Data Reviewed Labs: ordered. Decision-making details documented in ED Course. Radiology: ordered. Decision-making details documented in ED Course.  Risk OTC drugs. Prescription drug management. Decision regarding hospitalization.    This patient presents to the ED for concern of CP, this involves an extensive number of treatment options, and is a complaint that carries with it a high risk of complications and morbidity.  I considered the following differential and admission for this acute, potentially life threatening condition.   MDM:    DDX for chest pain includes but is not limited to:  Greatest concern for ACS or unstable angina given presenting sxs. Also consider aortic dissection, CTA w/ no dissection. Given aspirin and nitroglycerin  which improves his pain.  Clinical Course as of 05/28/23 1952  Tue May 28, 2023  1352 Paging cardiology STEMI pager to discuss EKG. Has diffuse inferolateral ST depressions and T wave inversions and anterior elevations. Anterior elevations look improved from prior EKG in April but ST depressions in inferior leads look worse. [HN]  1356 D/w Dr. Arlester Ladd on STEMI pager. Agrees that April EKG looks just as bad if not worse. Doesn't recommend acute cath lab and instead recommends troponin and consulting cardiology.  [HN]   1445 CP improved with 2 tabs of SL nitroglycerin [HN]  1524 Troponin I (High Sensitivity): 17 neg [HN]  1524 CT ANGIO CHEST/ABD/PEL FOR DISSECTION W &/OR WO CONTRAST 1. Normal contour and caliber of the thoracic and abdominal aorta. Severe, irregular atherosclerosis of the infrarenal abdominal aorta. No evidence of aneurysm, dissection, or other acute aortic pathology. 2. Irregular nodule of the right pulmonary apex measuring 1.0 x 0.5 cm. This is modestly suspicious for primary lung malignancy. By Fleischner Society criteria Consider one of the following in 3 months for both low-risk and high-risk individuals: (a) repeat chest CT, (b) follow-up PET-CT, or (c) tissue sampling.  3. Severe emphysema and diffuse bilateral bronchial wall thickening. 4. Descending and sigmoid diverticulosis. Severe, chronic wall thickening of the sigmoid colon, most consistent with chronic sequelae of diverticulitis. 5. Coronary artery disease.   [HN]  1527 Consulted to cardiology [HN]  1540 D/w patient his imaging findings. D/w Dr. Mallipeddi with cards who will come to see the patient. Consulted to hospitalist. [HN]    Clinical Course User Index [HN] Merdis Stalling, MD    Labs: I Ordered, and personally interpreted labs.  The pertinent results include:  those listed above  Imaging Studies ordered: I ordered imaging studies including CT dissection study I independently visualized and interpreted imaging. I agree with the radiologist interpretation  Additional history obtained from chart review, wife at bedside  Cardiac Monitoring: The patient was maintained on a cardiac monitor.  I personally viewed and interpreted the cardiac monitored which showed an underlying rhythm of: NSR  Reevaluation: After the interventions noted above, I reevaluated the patient and found that they have :improved  Social Determinants of Health: Lives independently, married w/ children  Disposition:  admitted to [facility]  hospitalist w/ cards following  Co morbidities that complicate the patient evaluation  Past Medical History:  Diagnosis Date   Arthritis    GERD (gastroesophageal reflux disease)    Headache    migraines    IBS (irritable bowel syndrome)      Medicines Meds ordered this encounter  Medications   DISCONTD: aspirin tablet 325 mg   nitroGLYCERIN (NITROSTAT) SL tablet 0.4 mg   nicotine (NICODERM CQ - dosed in mg/24 hours) patch 21 mg   iohexol  (OMNIPAQUE ) 350 MG/ML injection 100 mL   aspirin EC tablet 81 mg   amLODipine (NORVASC) tablet 5 mg   FOLLOWED BY Linked Order Group    heparin bolus via infusion 3,800 Units    heparin ADULT infusion 100 units/mL (25000 units/250mL)    I have reviewed the patients home medicines and have made adjustments as needed  Problem List / ED Course: Problem List Items Addressed This Visit       Cardiovascular and Mediastinum   * (Principal) Unstable angina (HCC) - Primary   Relevant Medications   nitroGLYCERIN (NITROSTAT) SL tablet 0.4 mg   aspirin EC tablet 81 mg (Start on 05/29/2023 10:00 AM)   amLODipine (NORVASC) tablet 5  mg   heparin ADULT infusion 100 units/mL (25000 units/250mL)   Other Visit Diagnoses       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    Merdis Stalling, MD 05/28/23 [phone]

## 2023-05-28 NOTE — Progress Notes (Signed)
 PHARMACY - ANTICOAGULATION CONSULT NOTE  Pharmacy Consult for Heparin Indication: chest pain/ACS  Allergies  Allergen Reactions   Penicillins Rash    Immediate rash, facial/tongue/throat swelling, SOB or lightheadedness with hypotension     Patient Measurements: Height: 5\' 8"  (172.7 cm) Weight: 64 kg (141 lb 1.5 oz) IBW/kg (Calculated) : 68.4 HEPARIN DW (KG): 64  Vital Signs: Temp: 97.6 F (36.4 C) (05/06 1344) Temp Source: Oral (05/06 1344) BP: 162/101 (05/06 1344) Pulse Rate: 91 (05/06 1344)  Labs: Recent Labs    05/28/23 1418 05/28/23 1548  HGB 15.3  --   HCT 44.7  --   PLT 204  --   APTT 31  --   LABPROT 13.3  --   INR 1.0  --   CREATININE 0.94  --   TROPONINIHS 17 15    Estimated Creatinine Clearance: 78.5 mL/min (by C-G formula based on SCr of 0.94 mg/dL).   Medical History: Past Medical History:  Diagnosis Date   Arthritis    GERD (gastroesophageal reflux disease)    Headache    migraines    IBS (irritable bowel syndrome)     Medications:  See med rec  Assessment: 58 y.o. male presents with several weeks of intermittent chest pain. Comes with exertion but also at rest. Endorses SOB and diaphoresis with nausea and vomiting when it arises. Radiates into his left neck, left chest, and left arm . Concern for accelerating angina. Patient not on any anticoagulation. Pharmacy asked to start heparin  Goal of Therapy:  Heparin level 0.3-0.7 units/ml Monitor platelets by anticoagulation protocol: Yes   Plan:  Give 3800 units bolus x 1 Start heparin infusion at 900 units/hr Check anti-Xa level in ~6-8 hours and daily while on heparin Continue to monitor H&H and platelets  Shakeera Rightmyer, BS Pharm D, BCPS Clinical Pharmacist 05/28/2023,4:40 PM

## 2023-05-28 NOTE — ED Triage Notes (Signed)
 Pt arrived via POV c/o persistent chest pain that recently has begun traveling to his left arm. Pt reports pain has been worsening for a month and reports he spoke to his PCP and cannot see a cardiologist until July.

## 2023-05-28 NOTE — Progress Notes (Signed)
 Dr. Learta Prost note to follow, but preliminarily entered these verbal orders per our discussion: ASA 81mg  daily starting in AM (got 325mg  today), add amlodipine 5mg  daily, 2d echo, heparin per pharmacy, and lipid panel in AM. She reports she will be discussing with EDP + admitting physician her recommendation to transfer to [facility] on the medicine service with cardiology to follow down there for consideration of coronary CT - she also recommends getting pulm evaluation down at [facility] to help guide recs for question of lung malignancy. Cardmaster Trish alerted of need for our team to follow once transferred at [facility]. Will need orders for cor CT once the plan is finalized and determined it can be accommodated on schedule.

## 2023-05-28 NOTE — Consult Note (Addendum)
 CARDIOLOGY CONSULT NOTE    Patient ID: Reginald [name]; [id number]; [birth year]   Admit date: 05/28/2023 Date of Consult: 05/28/2023  Primary Care Provider: Cynda Drafts, FNP Primary Cardiologist:  Primary Electrophysiologist:    History of Present Illness:   Reginald [name] is a 58 year old M known to have GERD presented to the ER with chest pain.  He had exertional chest pains in the last 6 months that worsened in the last 1 month occurring daily and multiple times throughout the day and progressing to rest the last 2 weeks.  Chest pains occur in the center of his chest radiating to his left arm and associated with left arm numbness. These episodes initially lasted for about 10 to 20 minutes and eventually progressed in the duration to 40 minutes.  After the chest pain resolved with rest, he had aftereffects like severe fatigue and no stamina for the rest of the day. He also developed nausea and vomiting for the last 3 weeks.  He never went to a doctor and this is the first time he seek medical attention per his family's request.  Current smoker, smokes 1 and half pack per day.  Quit smoking for 3 years but picked it up after his grandmother passed away.   CT chest imaging in the ER today showed 1 cm lung nodule in the right apex suspicious for lung malignancy.  This is new to him.  Past Medical History:  Diagnosis Date   Arthritis    GERD (gastroesophageal reflux disease)    Headache    migraines    IBS (irritable bowel syndrome)     Past Surgical History:  Procedure Laterality Date   BIOPSY  11/17/2021   Procedure: BIOPSY;  Surgeon: Urban Garden, MD;  [facility];  Service: Gastroenterology;;   COLONOSCOPY WITH PROPOFOL  N/A 11/17/2021   Procedure: COLONOSCOPY WITH PROPOFOL ;  Surgeon: Urban Garden, MD;  [facility];  Service: Gastroenterology;  Laterality: N/A;  200 ASA 2   ESOPHAGOGASTRODUODENOSCOPY N/A 04/24/2023   Procedure: EGD  (ESOPHAGOGASTRODUODENOSCOPY);  Surgeon: Umberto Ganong, Bearl Limes, MD;  [facility];  Service: Gastroenterology;  Laterality: N/A;  9:15AM;ASA 2   ESOPHAGOGASTRODUODENOSCOPY (EGD) WITH PROPOFOL  N/A 11/17/2021   Procedure: ESOPHAGOGASTRODUODENOSCOPY (EGD) WITH PROPOFOL ;  Surgeon: Urban Garden, MD;  [facility];  Service: Gastroenterology;  Laterality: N/A;   INGUINAL HERNIA REPAIR Right 06/02/2015   Procedure: RIGHT INGUINAL HERNIA REPAIR WITH MESH;  Surgeon: Aldean Hummingbird, MD;  Location: [facility] OR;  Service: General;  Laterality: Right;   INSERTION OF MESH Right 06/02/2015   Procedure: INSERTION OF MESH;  Surgeon: Aldean Hummingbird, MD;  Location: [facility] OR;  Service: General;  Laterality: Right;   NO PAST SURGERIES     POLYPECTOMY  11/17/2021   Procedure: POLYPECTOMY;  Surgeon: Umberto Ganong, Bearl Limes, MD;  [facility];  Service: Gastroenterology;;       Inpatient Medications: Scheduled Meds:  amLODipine  5 mg Oral Daily   [START ON 05/29/2023] aspirin EC  81 mg Oral Daily   aspirin  325 mg Oral Daily   nicotine  21 mg Transdermal Once   Continuous Infusions:  PRN Meds: nitroGLYCERIN  Allergies:    Allergies  Allergen Reactions   Penicillins Rash    Immediate rash, facial/tongue/throat swelling, SOB or lightheadedness with hypotension     Social History:   Social History   Socioeconomic History   Marital status: Significant Other    Spouse name:  Not on file   Number of children: Not on file   Years of education: Not on file   Highest education level: Not on file  Occupational History   Not on file  Tobacco Use   Smoking status: Heavy Smoker    Current packs/day: 1.50    Types: Cigarettes    Passive exposure: Current   Smokeless tobacco: Former  Building services engineer status: Never Used  Substance and Sexual Activity   Alcohol use: No    Alcohol/week: 0.0 standard drinks of alcohol    Comment: formerly a heavy drinker, quit 2002    Drug use: No   Sexual activity: Not on file  Other Topics Concern   Not on file  Social History Narrative   Not on file   Social Drivers of Health   Financial Resource Strain: Low Risk  (03/20/2023)   Received from [facility]   Overall Financial Resource Strain (CARDIA)    Difficulty of Paying Living Expenses: Not hard at all  Food Insecurity: Low Risk  (05/03/2023)   Received from [facility]   Hunger Vital Sign    Worried About Running Out of Food in the Last Year: Never true    Ran Out of Food in the Last Year: Never true  Transportation Needs: No Transportation Needs (05/03/2023)   Received from [facility]    In the past 12 months, has lack of reliable transportation kept you from medical appointments, meetings, work or from getting things needed for daily living? : No  Physical Activity: Sufficiently Active (03/20/2023)   Received from [facility]   Exercise Vital Sign    Days of Exercise per Week: 7 days    Minutes of Exercise per Session: 30 min  Stress: No Stress Concern Present (03/20/2023)   Received from [facility] - Occupational Stress Questionnaire    Feeling of Stress : Only a little  Social Connections: Moderately Integrated (03/20/2023)   Received from [facility]   Social Connection and Isolation Panel [NHANES]    Frequency of Communication with Friends and Family: More than three times a week    Frequency of Social Gatherings with Friends and Family: Never    Attends Religious Services: More than 4 times per year    Active Member of [employer] or Organizations: No    Attends Banker Meetings: Never    Marital Status: Living with partner  Intimate Partner Violence: Not At Risk (03/20/2023)   Received from [facility]   Humiliation, Afraid, Rape, and Kick questionnaire    Fear of Current or Ex-Partner: No    Emotionally Abused: No    Physically Abused: No    Sexually  Abused: No    Family History:    Family History  Problem Relation Age of Onset   Diabetes Mother    Osteoarthritis Mother    Heart disease Father      ROS:  Please see the history of present illness.  ROS  All other ROS reviewed and negative.     Physical Exam/Data:   Vitals:   05/28/23 1343 05/28/23 1344  BP:  (!) 162/101  Pulse:  91  Resp:  18  Temp:  97.6 F (36.4 C)  TempSrc:  Oral  SpO2:  100%  Weight: 64 kg   Height: 5\' 8"  (1.727 m)    No intake or output data in the 24 hours  ending 05/28/23 1633 Filed Weights   05/28/23 1343  Weight: 64 kg   Body mass index is 21.45 kg/m.  General:  Well nourished, well developed, in no acute distress HEENT: normal Lymph: no adenopathy Neck: no JVD Endocrine:  No thryomegaly Vascular: No carotid bruits; FA pulses 2+ bilaterally without bruits  Cardiac:  normal S1, S2; RRR; no murmur Lungs:  clear to auscultation bilaterally, no wheezing, rhonchi or rales  Abd: soft, nontender, no hepatomegaly  Ext: no edema Musculoskeletal:  No deformities, BUE and BLE strength normal and equal Skin: warm and dry  Neuro:  CNs 2-12 intact, no focal abnormalities noted Psych:  Normal affect   EKG:  The EKG was personally reviewed and demonstrates:   Telemetry:  Telemetry was personally reviewed and demonstrates:    Relevant CV Studies:   Laboratory Data:  Chemistry Recent Labs  Lab 05/28/23 1418  NA 136  K 3.8  CL 104  CO2 24  GLUCOSE 120*  BUN 12  CREATININE 0.94  CALCIUM 8.8*  GFRNONAA >60  ANIONGAP 8    Recent Labs  Lab 05/28/23 1418  PROT 6.9  ALBUMIN 4.0  AST 17  ALT 10  ALKPHOS 78  BILITOT 0.4   Hematology Recent Labs  Lab 05/28/23 1418  WBC 8.2  RBC 4.82  HGB 15.3  HCT 44.7  MCV 92.7  MCH 31.7  MCHC 34.2  RDW 12.7  PLT 204   Cardiac EnzymesNo results for input(s): "TROPONINI" in the last 168 hours. No results for input(s): "TROPIPOC" in the last 168 hours.  BNP Recent Labs  Lab  05/28/23 1418  BNP 175.0*    DDimer No results for input(s): "DDIMER" in the last 168 hours.  Radiology/Studies:  CT ANGIO CHEST/ABD/PEL FOR DISSECTION W &/OR WO CONTRAST Result Date: 05/28/2023 CLINICAL DATA:  Persistent chest pain radiating to arm * Tracking Code: BO * EXAM: CT ANGIOGRAPHY CHEST, ABDOMEN AND PELVIS TECHNIQUE: Non-contrast CT of the chest was initially obtained. Multidetector CT imaging through the chest, abdomen and pelvis was performed using the standard protocol during bolus administration of intravenous contrast. Multiplanar reconstructed images and MIPs were obtained and reviewed to evaluate the vascular anatomy. RADIATION DOSE REDUCTION: This exam was performed according to the departmental dose-optimization program which includes automated exposure control, adjustment of the mA and/or kV according to patient size and/or use of iterative reconstruction technique. CONTRAST:  OMNIPAQUE  IOHEXOL  350 MG/ML SOLN COMPARISON:  None Available. FINDINGS: CTA CHEST FINDINGS VASCULAR Aorta: Satisfactory opacification of the aorta. Normal contour and caliber of the thoracic aorta. No evidence of aneurysm, dissection, or other acute aortic pathology. Mild mixed calcific atherosclerosis of the thoracic aorta. Cardiovascular: No evidence of pulmonary embolism on limited non-tailored examination. Normal heart size. Left coronary artery calcifications. No pericardial effusion. Review of the MIP images confirms the above findings. NON VASCULAR Mediastinum/Nodes: No enlarged mediastinal, hilar, or axillary lymph nodes. Thyroid gland, trachea, and esophagus demonstrate no significant findings. Lungs/Pleura: Severe emphysema. Diffuse bilateral bronchial wall thickening. Irregular nodule of the right pulmonary apex measuring 1.0 x 0.5 cm (series 7, image 25). No pleural effusion or pneumothorax. Musculoskeletal: No chest wall abnormality. No acute osseous findings. Review of the MIP images confirms the  above findings. CTA ABDOMEN AND PELVIS FINDINGS VASCULAR Normal caliber of the abdominal aorta. Severe, irregular atherosclerosis of the infrarenal abdominal aorta. No evidence of aneurysm, dissection, or other acute aortic pathology. Duplicated bilateral renal arteries with otherwise standard branching pattern of the abdominal aorta. Review of the  MIP images confirms the above findings. NON-VASCULAR Hepatobiliary: No solid liver abnormality is seen. Contracted gallbladder. No gallstones, gallbladder wall thickening, or biliary dilatation. Pancreas: Unremarkable. No pancreatic ductal dilatation or surrounding inflammatory changes. Spleen: Normal in size without significant abnormality. Adrenals/Urinary Tract: Adrenal glands are unremarkable. Kidneys are normal, without renal calculi, solid lesion, or hydronephrosis. Bladder is unremarkable. Stomach/Bowel: Stomach is within normal limits. Appendix appears normal. Descending and sigmoid diverticulosis. Severe, chronic wall thickening of the sigmoid colon (series 5, image 179). Lymphatic: No enlarged abdominal or pelvic lymph nodes. Reproductive: No mass or other significant abnormality. Other: No abdominal wall hernia or abnormality. No ascites. Musculoskeletal: No acute osseous findings. IMPRESSION: 1. Normal contour and caliber of the thoracic and abdominal aorta. Severe, irregular atherosclerosis of the infrarenal abdominal aorta. No evidence of aneurysm, dissection, or other acute aortic pathology. 2. Irregular nodule of the right pulmonary apex measuring 1.0 x 0.5 cm. This is modestly suspicious for primary lung malignancy. By Fleischner Society criteria Consider one of the following in 3 months for both low-risk and high-risk individuals: (a) repeat chest CT, (b) follow-up PET-CT, or (c) tissue sampling. This recommendation follows the consensus statement: Guidelines for Management of Incidental Pulmonary Nodules Detected on CT Images: From the Fleischner  Society 2017; Radiology 2017; 284:228-243. 3. Severe emphysema and diffuse bilateral bronchial wall thickening. 4. Descending and sigmoid diverticulosis. Severe, chronic wall thickening of the sigmoid colon, most consistent with chronic sequelae of diverticulitis. 5. Coronary artery disease. Aortic Atherosclerosis (ICD10-I70.0) and Emphysema (ICD10-J43.9). Electronically Signed   By: Fredricka Jenny M.D.   On: 05/28/2023 15:19    Assessment and Plan:   Accelerating angina/unstable angina: Ongoing exertional chest pain for the last 6 months that worsened in the last 1 month to the point it is occurring daily and multiple times throughout the day with exertion and progressed to rest in the last 2 weeks.  Chest pain radiates to left arm with associated numbness, SOB.  Last for about 20 minutes to 40 minutes. Had nausea and vomiting in the last 2 weeks.  After chest pain resolved with rest, he had severe fatigue for the rest of the day.  EKG showed subtle ST elevations in leads aVR, V1 and diffuse ST depressions which is not new. Hs troponins normal, 17>>15.  CT chest today showed incidental finding of 1 cm pulmonary nodule in the R apex suspicious for lung malignancy.  Since this is not a straightforward situation, prefer CT cardiac then LHC. He will need to be admitted to hospitalist team, transferred to [facility] for pulm consult (for lung nodule) and CT cardiac. Start heparin drip empirically. If the lung nodule is deemed to be low/intermediate risk and does not need any biopsy, we will proceed with LHC based on CT cardiac findings.  Will start him on aspirin 81 mg once daily, high intensity statin and amlodipine 5 mg once daily for antianginal therapy as well as HTN control. Obtain Echo, lipid panel, HbA1C.  HTN, poorly controlled: Start amlodipine 5 mg once daily.  He was having headaches in the last few months, Imdur deferred.  Nicotine abuse: Counseling provided.  He reported that he quit smoking for 3 years and  picked it up after his grandmother passed away.  Severe emphysema on imaging: Management per primary. Outpatient PFTs.   For questions or updates, please contact [facility] Please consult [email or web address] for contact info under Cardiology/STEMI.   Signed, Kaizer Dissinger Priya Shavell Nored, MD 05/28/2023 4:33 PM

## 2023-05-29 ENCOUNTER — Inpatient Hospital Stay (HOSPITAL_COMMUNITY)

## 2023-05-29 ENCOUNTER — Encounter (HOSPITAL_COMMUNITY)
Admission: EM | Disposition: A | Discharge status: Home / Self Care | Payer: Self-pay | Source: Home / Self Care | Attending: Thoracic Surgery (Cardiothoracic Vascular Surgery)

## 2023-05-29 DIAGNOSIS — I25110 Atherosclerotic heart disease of native coronary artery with unstable angina pectoris: Secondary | ICD-10-CM

## 2023-05-29 DIAGNOSIS — R079 Chest pain, unspecified: Secondary | ICD-10-CM | POA: Diagnosis not present

## 2023-05-29 DIAGNOSIS — J439 Emphysema, unspecified: Secondary | ICD-10-CM

## 2023-05-29 DIAGNOSIS — I200 Unstable angina: Secondary | ICD-10-CM | POA: Diagnosis not present

## 2023-05-29 DIAGNOSIS — R911 Solitary pulmonary nodule: Secondary | ICD-10-CM | POA: Diagnosis not present

## 2023-05-29 HISTORY — PX: LEFT HEART CATH AND CORONARY ANGIOGRAPHY: CATH118249

## 2023-05-29 LAB — HEPARIN LEVEL (UNFRACTIONATED)
Heparin Unfractionated: 0.16 [IU]/mL — ABNORMAL LOW (ref 0.30–0.70)
Heparin Unfractionated: 0.3 [IU]/mL (ref 0.30–0.70)

## 2023-05-29 LAB — HEMOGLOBIN A1C
Hgb A1c MFr Bld: 5.4 % (ref 4.8–5.6)
Mean Plasma Glucose: 108.28 mg/dL

## 2023-05-29 LAB — ECHOCARDIOGRAM COMPLETE
Area-P 1/2: 3.13 cm2
Calc EF: 34.8 %
Height: 68 in
S' Lateral: 3.6 cm
Single Plane A2C EF: 36.9 %
Single Plane A4C EF: 37.5 %
Weight: 2049.4 [oz_av]

## 2023-05-29 LAB — CBC
HCT: 45.8 % (ref 39.0–52.0)
Hemoglobin: 15.4 g/dL (ref 13.0–17.0)
MCH: 30.5 pg (ref 26.0–34.0)
MCHC: 33.6 g/dL (ref 30.0–36.0)
MCV: 90.7 fL (ref 80.0–100.0)
Platelets: 202 10*3/uL (ref 150–400)
RBC: 5.05 MIL/uL (ref 4.22–5.81)
RDW: 12.8 % (ref 11.5–15.5)
WBC: 9.2 10*3/uL (ref 4.0–10.5)
nRBC: 0 % (ref 0.0–0.2)

## 2023-05-29 LAB — HIV ANTIBODY (ROUTINE TESTING W REFLEX): HIV Screen 4th Generation wRfx: NONREACTIVE

## 2023-05-29 SURGERY — LEFT HEART CATH AND CORONARY ANGIOGRAPHY
Anesthesia: LOCAL

## 2023-05-29 MED ORDER — FENTANYL CITRATE (PF) 100 MCG/2ML IJ SOLN
INTRAMUSCULAR | Status: DC | PRN
  Administered 2023-05-29: 25 ug via INTRAVENOUS

## 2023-05-29 MED ORDER — IPRATROPIUM BROMIDE 0.02 % IN SOLN: 0.5000 mg | Freq: Four times a day (QID) | RESPIRATORY_TRACT | Status: DC | PRN

## 2023-05-29 MED ORDER — HYDRALAZINE HCL 20 MG/ML IJ SOLN: 10.0000 mg | INTRAMUSCULAR | Status: AC | PRN

## 2023-05-29 MED ORDER — HEPARIN (PORCINE) IN NACL 1000-0.9 UT/500ML-% IV SOLN
INTRAVENOUS | Status: DC | PRN
  Administered 2023-05-29: 1000 mL

## 2023-05-29 MED ORDER — SODIUM CHLORIDE 0.9 % WEIGHT BASED INFUSION
3.0000 mL/kg/h | INTRAVENOUS | Status: DC
  Administered 2023-05-29: 3 mL/kg/h via INTRAVENOUS

## 2023-05-29 MED ORDER — MIDAZOLAM HCL 2 MG/2ML IJ SOLN
INTRAMUSCULAR | Status: DC | PRN
  Administered 2023-05-29: 1 mg via INTRAVENOUS

## 2023-05-29 MED ORDER — LEVALBUTEROL HCL 0.63 MG/3ML IN NEBU: 0.6300 mg | INHALATION_SOLUTION | Freq: Four times a day (QID) | RESPIRATORY_TRACT | Status: DC | PRN

## 2023-05-29 MED ORDER — FENTANYL CITRATE (PF) 100 MCG/2ML IJ SOLN
INTRAMUSCULAR | Status: AC
  Filled 2023-05-29: qty 2

## 2023-05-29 MED ORDER — IOHEXOL 350 MG/ML SOLN
INTRAVENOUS | Status: DC | PRN
  Administered 2023-05-29: 32 mL

## 2023-05-29 MED ORDER — LIDOCAINE HCL (PF) 1 % IJ SOLN
INTRAMUSCULAR | Status: AC
  Filled 2023-05-29: qty 30

## 2023-05-29 MED ORDER — SODIUM CHLORIDE 0.9% FLUSH
3.0000 mL | Freq: Two times a day (BID) | INTRAVENOUS | Status: DC
  Administered 2023-05-29: 3 mL via INTRAVENOUS

## 2023-05-29 MED ORDER — PERFLUTREN LIPID MICROSPHERE
1.0000 mL | INTRAVENOUS | Status: AC | PRN
  Administered 2023-05-29: 2 mL via INTRAVENOUS

## 2023-05-29 MED ORDER — SODIUM CHLORIDE 0.9 % WEIGHT BASED INFUSION: 1.0000 mL/kg/h | INTRAVENOUS | Status: DC

## 2023-05-29 MED ORDER — HEPARIN SODIUM (PORCINE) 1000 UNIT/ML IJ SOLN
INTRAMUSCULAR | Status: DC | PRN
  Administered 2023-05-29: 3000 [IU] via INTRAVENOUS

## 2023-05-29 MED ORDER — LIDOCAINE HCL (PF) 1 % IJ SOLN
INTRAMUSCULAR | Status: DC | PRN
  Administered 2023-05-29: 5 mL

## 2023-05-29 MED ORDER — HEPARIN BOLUS VIA INFUSION
2000.0000 [IU] | Freq: Once | INTRAVENOUS | Status: AC
  Administered 2023-05-29: 2000 [IU] via INTRAVENOUS
  Filled 2023-05-29: qty 2000

## 2023-05-29 MED ORDER — HEPARIN (PORCINE) 25000 UT/250ML-% IV SOLN
1150.0000 [IU]/h | INTRAVENOUS | Status: DC
  Administered 2023-05-29: 1050 [IU]/h via INTRAVENOUS
  Administered 2023-05-30: 1150 [IU]/h via INTRAVENOUS
  Filled 2023-05-29: qty 250

## 2023-05-29 MED ORDER — HEPARIN SODIUM (PORCINE) 1000 UNIT/ML IJ SOLN
INTRAMUSCULAR | Status: AC
  Filled 2023-05-29: qty 10

## 2023-05-29 MED ORDER — IPRATROPIUM BROMIDE 0.02 % IN SOLN
0.5000 mg | Freq: Four times a day (QID) | RESPIRATORY_TRACT | Status: DC
  Administered 2023-05-29: 0.5 mg via RESPIRATORY_TRACT
  Filled 2023-05-29: qty 2.5

## 2023-05-29 MED ORDER — VERAPAMIL HCL 2.5 MG/ML IV SOLN
INTRAVENOUS | Status: DC | PRN
  Administered 2023-05-29: 10 mL via INTRA_ARTERIAL

## 2023-05-29 MED ORDER — ROSUVASTATIN CALCIUM 20 MG PO TABS
20.0000 mg | ORAL_TABLET | Freq: Every day | ORAL | Status: DC
  Administered 2023-05-29 – 2023-06-06 (×6): 20 mg via ORAL
  Filled 2023-05-29 (×6): qty 1

## 2023-05-29 MED ORDER — VERAPAMIL HCL 2.5 MG/ML IV SOLN
INTRAVENOUS | Status: AC
  Filled 2023-05-29: qty 2

## 2023-05-29 MED ORDER — SUCRALFATE 1 G PO TABS
1.0000 g | ORAL_TABLET | Freq: Three times a day (TID) | ORAL | Status: DC
  Administered 2023-05-29 – 2023-06-06 (×22): 1 g via ORAL
  Filled 2023-05-29 (×31): qty 1

## 2023-05-29 MED ORDER — SODIUM CHLORIDE 0.9% FLUSH: 3.0000 mL | INTRAVENOUS | Status: DC | PRN

## 2023-05-29 MED ORDER — SODIUM CHLORIDE 0.9 % IV SOLN: 250.0000 mL | INTRAVENOUS | Status: AC | PRN

## 2023-05-29 MED ORDER — POTASSIUM CHLORIDE CRYS ER 20 MEQ PO TBCR
20.0000 meq | EXTENDED_RELEASE_TABLET | Freq: Once | ORAL | Status: AC
  Administered 2023-05-29: 20 meq via ORAL
  Filled 2023-05-29: qty 1

## 2023-05-29 MED ORDER — NICOTINE 21 MG/24HR TD PT24
21.0000 mg | MEDICATED_PATCH | Freq: Every day | TRANSDERMAL | Status: DC
  Administered 2023-05-29 – 2023-06-06 (×8): 21 mg via TRANSDERMAL
  Filled 2023-05-29 (×8): qty 1

## 2023-05-29 MED ORDER — ASPIRIN 81 MG PO CHEW: 81.0000 mg | CHEWABLE_TABLET | ORAL | Status: DC

## 2023-05-29 MED ORDER — MIDAZOLAM HCL 2 MG/2ML IJ SOLN
INTRAMUSCULAR | Status: AC
  Filled 2023-05-29: qty 2

## 2023-05-29 SURGICAL SUPPLY — 7 items
CATH 5FR JL3.5 JR4 ANG PIG MP (CATHETERS) IMPLANT
DEVICE RAD COMP TR BAND LRG (VASCULAR PRODUCTS) IMPLANT
GLIDESHEATH SLEND SS 6F .021 (SHEATH) IMPLANT
GUIDEWIRE INQWIRE 1.5J.035X260 (WIRE) IMPLANT
PACK CARDIAC CATHETERIZATION (CUSTOM PROCEDURE TRAY) ×1 IMPLANT
SET ATX-X65L (MISCELLANEOUS) IMPLANT
SHEATH PROBE COVER 6X72 (BAG) IMPLANT

## 2023-05-29 NOTE — Progress Notes (Signed)
 PHARMACY - ANTICOAGULATION CONSULT NOTE  Pharmacy Consult for Heparin Indication: chest pain/ACS  Allergies  Allergen Reactions   Penicillins Rash    Immediate rash, facial/tongue/throat swelling, SOB or lightheadedness with hypotension     Patient Measurements: Height: 5\' 8"  (172.7 cm) Weight: 58.1 kg (128 lb 1.4 oz) IBW/kg (Calculated) : 68.4 HEPARIN DW (KG): 58.1  Vital Signs: Temp: 98 F (36.7 C) (05/07 2007) Temp Source: Oral (05/07 2007) BP: 127/74 (05/07 2030) Pulse Rate: 73 (05/07 2030)  Labs: Recent Labs    05/28/23 1418 05/28/23 1548 05/29/23 0339 05/29/23 1345  HGB 15.3  --  15.4  --   HCT 44.7  --  45.8  --   PLT 204  --  202  --   APTT 31  --   --   --   LABPROT 13.3  --   --   --   INR 1.0  --   --   --   HEPARINUNFRC  --   --  0.16* 0.30  CREATININE 0.94  --   --   --   TROPONINIHS 17 15  --   --     Estimated Creatinine Clearance: 71.3 mL/min (by C-G formula based on SCr of 0.94 mg/dL).   Medical History: Past Medical History:  Diagnosis Date   Arthritis    GERD (gastroesophageal reflux disease)    Headache    migraines    IBS (irritable bowel syndrome)     Medications:  See med rec  Assessment: 58 y.o. male presents with several weeks of intermittent chest pain. Comes with exertion but also at rest. Endorses SOB and diaphoresis with nausea and vomiting when it arises. Radiates into his left neck, left chest, and left arm . Concern for accelerating angina. Patient not on any anticoagulation. Pharmacy asked to start heparin  Pt s/p LHC with plans for surgical evaluation for CABG. Pharmacy to resume heparin 2h after TR band removed (~2030).  Goal of Therapy:  Heparin level 0.3-0.7 units/ml Monitor platelets by anticoagulation protocol: Yes   Plan:  Resume heparin 1050 units/h no bolus at 2230 Check heparin level with am labs since therapeutic this am   Levin Reamer, PharmD, BCPS, [facility] Clinical Pharmacist [phone] Please  check AMION for all [facility] Pharmacy numbers 05/29/2023

## 2023-05-29 NOTE — Progress Notes (Signed)
 PHARMACY - ANTICOAGULATION CONSULT NOTE  Pharmacy Consult for Heparin Indication: chest pain/ACS  Allergies  Allergen Reactions   Penicillins Rash    Immediate rash, facial/tongue/throat swelling, SOB or lightheadedness with hypotension     Patient Measurements: Height: 5\' 8"  (172.7 cm) Weight: 58.1 kg (128 lb 1.4 oz) IBW/kg (Calculated) : 68.4 HEPARIN DW (KG): 58.1  Vital Signs: Temp: 97.6 F (36.4 C) (05/07 0342) Temp Source: Oral (05/07 0342) BP: 142/95 (05/07 0342) Pulse Rate: 59 (05/07 0342)  Labs: Recent Labs    05/28/23 1418 05/28/23 1548 05/29/23 0339  HGB 15.3  --  15.4  HCT 44.7  --  45.8  PLT 204  --  202  APTT 31  --   --   LABPROT 13.3  --   --   INR 1.0  --   --   HEPARINUNFRC  --   --  0.16*  CREATININE 0.94  --   --   TROPONINIHS 17 15  --     Estimated Creatinine Clearance: 71.3 mL/min (by C-G formula based on SCr of 0.94 mg/dL).   Medical History: Past Medical History:  Diagnosis Date   Arthritis    GERD (gastroesophageal reflux disease)    Headache    migraines    IBS (irritable bowel syndrome)     Medications:  See med rec  Assessment: 58 y.o. male presents with several weeks of intermittent chest pain. Comes with exertion but also at rest. Endorses SOB and diaphoresis with nausea and vomiting when it arises. Radiates into his left neck, left chest, and left arm . Concern for accelerating angina. Patient not on any anticoagulation. Pharmacy asked to start heparin  5/7 AM update:  Heparin level sub-therapeutic   Goal of Therapy:  Heparin level 0.3-0.7 units/ml Monitor platelets by anticoagulation protocol: Yes   Plan:  Heparin 2000 units re-[employer] heparin to 1050 units/hr Heparin level in 8 hours  Silvestre Drum, PharmD, BCPS Clinical Pharmacist Phone: [phone]

## 2023-05-29 NOTE — Progress Notes (Addendum)
   Patient Name: Reginald [name] Date of Encounter: 05/29/2023 [facility] Cardiologist: New  Interval Summary  .    Patient resting comfortably in bed with family present Still having mild chest discomfort but much improved from before  Waiting on pulmonary consult to see if they are needing to do a biopsy and will guide timing of LHC   Vital Signs .    Vitals:   05/28/23 1830 05/28/23 1948 05/28/23 2332 05/29/23 0342  BP: 136/84 (!) 150/96 134/83 (!) 142/95  Pulse: 85 82 66 (!) 59  Resp: 17 19 16 16   Temp:  97.9 F (36.6 C) 98 F (36.7 C) 97.6 F (36.4 C)  TempSrc:  Oral Oral Oral  SpO2: 94% 97% 96% 96%  Weight:  58.1 kg    Height:  5\' 8"  (1.727 m)      Intake/Output Summary (Last 24 hours) at 05/29/2023 0838 Last data filed at 05/29/2023 0344 Gross per 24 hour  Intake 1080 ml  Output --  Net 1080 ml      05/28/2023    7:48 PM 05/28/2023    1:43 PM 04/24/2023    7:59 AM  Last 3 Weights  Weight (lbs) 128 lb 1.4 oz 141 lb 1.5 oz 140 lb  Weight (kg) 58.1 kg 64 kg 63.504 kg     Telemetry/ECG    Sinus rhythm, HR 80-90s - Personally Reviewed  Physical Exam .   GEN: No acute distress, on room air.   Neck: No JVD Cardiac: RRR, no murmurs, rubs, or gallops.  Respiratory: Clear to auscultation bilaterally. GI: Soft, nontender, non-distended  MS: No edema  Assessment & Plan .   Reginald [name] is a 58 y.o. male with past medical history of GERD, migraines, IBS, arthritis who presented to [facility] with intermittent chest pain. He was transferred to [facility] to undergo pulmonary consultation for incidental finding of lung nodule. Pending results of pulmonary consultation will guide cardiac evaluation.   Unstable angina  Patient presented with chest pain x 6 months, worse last month occurring at rest last 2 weeks EKG showed subtle STE in aVR, V1 and diffuse ST depressions which are not new Troponin levels 17 > 15 Chest CT showed 1 cm pulmonary nodule in right  apex possibly malignant  Waiting to see if pulmonary needs to do a biopsy -- this will guide decision of when to do LHC Continue ASA 81 mg daily Continue amlodipine 5 mg daily  Continue IV heparin  Start Crestor 20 mg daily  Pending echocardiogram, lipid panel, A1C   Hypertension Most recent BP 142/95 prior to getting morning medications Continue amlodipine as above Monitor response and titrate/add on medications as needed   Per primary Lung nodule Severe emphysema  Nicotine abuse  IBS GERD  For questions or updates, please contact [facility] Please consult [email or web address] for contact info under        Signed, Jiles Mote, PA-C   I have personally seen and examined this patient. I agree with the assessment and plan as outlined above.  Pt with unstable angina. Negative troponin. [facility].  Pulmonary evaluation today and they recommend proceeding with his cardiac cath and then outpatient pulmonary workup.   Antoinette Batman, MD, [facility] 05/29/2023 10:42 AM

## 2023-05-29 NOTE — Interval H&P Note (Signed)
 History and Physical Interval Note:  05/29/2023 3:28 PM  Reginald [name]  has presented today for surgery, with the diagnosis of cad.  The various methods of treatment have been discussed with the patient and family. After consideration of risks, benefits and other options for treatment, the patient has consented to  Procedure(s): LEFT HEART CATH AND CORONARY ANGIOGRAPHY (N/A) as a surgical intervention.  The patient's history has been reviewed, patient examined, no change in status, stable for surgery.  I have reviewed the patient's chart and labs.  Questions were answered to the patient's satisfaction.   Cath Lab Visit (complete for each Cath Lab visit)  Clinical Evaluation Leading to the Procedure:   ACS: Yes.    Non-ACS:    Anginal Classification: CCS IV  Anti-ischemic medical therapy: No Therapy  Non-Invasive Test Results: No non-invasive testing performed  Prior CABG: No previous CABG        Donata Fryer [facility] 05/29/2023 3:28 PM

## 2023-05-29 NOTE — Progress Notes (Signed)
 Progress Note   Patient: Reginald [name] ZOX:[id number] DOB: 04-01-1965 DOA: 05/28/2023     1 DOS: the patient was seen and examined on 05/29/2023   Brief hospital course: 58 year old man with PMH of arthritis, GERD, IBS who presented to the ED with anginal chest pain.  Cardiology was consulted.  Patient is scheduled for LHC. CT chest revealed a right pulmonary nodule concerning for malignancy and pulmonology was consulted.  Assessment and Plan:  ACS/unstable angina Troponins were negative (17 -> 15). EKG was unchanged from prior. HbA1c: 5.4. Lipid panel: Pending. Cardiology was consulted. Patient started on aspirin, heparin GTT. Echocardiogram completed, read is pending. - For LHC today.  Pulm Nodule: R apex.  Concerning for malignancy.  Pt w/ >60pack years of tobacco use.  Asymptomatic. New finding.  Pulmonology consulted and recommended outpatient PFT and PET scan. If positive, he needs Navigational bronchoscopy, bx, possible EBUS. If negative PET scan, re CT chest after 3 months.  -Outpatient follow-up as above.   COPD Due to smoking. No respiratory decompensation -Will continue to monitor   HTN Poorly controlled -Per cards rec, start Norvasc 5mg    Tobacco use Currently smokes 1.5ppd (as many as 2.5ppd). 60+pack years.  - Nicotine patch.    IBS/GERD - continue Bentyl ,PPI      Subjective: Patient is feeling little better.  Minimal chest pain today.  Physical Exam: Vitals:   05/29/23 0342 05/29/23 0848 05/29/23 1124 05/29/23 1146  BP: (!) 142/95 (!) 155/90 138/84   Pulse: (!) 59 72 72   Resp: 16 18 18    Temp: 97.6 F (36.4 C) 97.7 F (36.5 C) 98 F (36.7 C)   TempSrc: Oral Oral Oral   SpO2: 96% 97% 96% 96%  Weight:      Height:       Physical Exam   General: Alert, oriented X3  Eyes: Pupils equal, reactive  Oral cavity: moist mucous membranes  Head: Atraumatic, normocephalic  Neck: supple  Chest: Diminished breath sounds CVS: S1,S2 RRR. No murmurs   Abd: No distention, soft, non-tender. No masses palpable  Extr: No edema   MSK: No joint deformities or swelling  Neurological: Grossly intact.    Data Reviewed:      Latest Ref Rng & Units 05/29/2023    3:39 AM 05/28/2023    2:18 PM 08/16/2021   12:37 PM  CBC  WBC 4.0 - 10.5 K/uL 9.2  8.2  9.4   Hemoglobin 13.0 - 17.0 g/dL 81.1  91.4  78.2   Hematocrit 39.0 - 52.0 % 45.8  44.7  50.1   Platelets 150 - 400 K/uL 202  204  250       Latest Ref Rng & Units 05/28/2023    2:18 PM 08/16/2021   12:37 PM  BMP  Glucose 70 - 99 mg/dL 956  213   BUN 6 - 20 mg/dL 12  13   Creatinine 0.86 - 1.24 mg/dL 5.78  4.69   Sodium 629 - 145 mmol/L 136  142   Potassium 3.5 - 5.1 mmol/L 3.8  4.3   Chloride 98 - 111 mmol/L 104  107   CO2 22 - 32 mmol/L 24  27   Calcium 8.9 - 10.3 mg/dL 8.8  9.2      Family Communication: Significant other at bedside  Disposition: Status is: Inpatient Remains inpatient appropriate because: ACS on heparin gtt.  Planned Discharge Destination: Home    Time spent: 35 minutes  Author: MDALA-GAUSI, Reginald Huffaker AGATHA, MD 05/29/2023 2:57 PM  For on call review [email or web address].

## 2023-05-29 NOTE — Consult Note (Signed)
 NAME:  Reginald [name], MRN:  [id number], DOB:  [birth year], LOS: 1 ADMISSION DATE:  05/28/2023, CONSULTATION DATE: 05/29/2023 REFERRING MD: Jiles Mote, PA-C, CHIEF COMPLAINT:  pulm nodule   History of Present Illness:  A 58 yr old male patient with tobacco smoking (3 ppd for 40 yrs, cut down to 1.5 ppd daily lately), HTN, IBS, and GERD, who presents with CP for 6 months. Denied DOE, SOB, cough, wheezing, wt loss, anorexia, hx of VTE, LL edema, bronchitis, PNA, or resp issues or admissions. No hx of malignancy. CTA chest showed pulm nodule and PCCM was consulted for eval before LHC.   Pertinent  Medical History  HTN, IBS, GERD  Significant Hospital Events: Including procedures, antibiotic start and stop dates in addition to other pertinent events   05/28/2023: admission  Interim History / Subjective:    Objective   Blood pressure (!) 155/90, pulse 72, temperature 97.7 F (36.5 C), temperature source Oral, resp. rate 18, height 5\' 8"  (1.727 m), weight 58.1 kg, SpO2 97%.        Intake/Output Summary (Last 24 hours) at 05/29/2023 1012 Last data filed at 05/29/2023 0344 Gross per 24 hour  Intake 1080 ml  Output --  Net 1080 ml   Filed Weights   05/28/23 1343 05/28/23 1948  Weight: 64 kg 58.1 kg    Examination: General: alert, oriented x4, and comfortable. On RA. SpO2 96%  HENT: PERL, normal pharynx and oral mucosa. No LNE or thyromegaly. No JVD Lungs: fair symmetrical air entry bilaterally. No crackles. Mild exp wheezing Cardiovascular: NL S1/S2. No m/g/r Abdomen: no distension or tenderness Extremities: no edema. Symmetrical  Neuro: nonfocal    Resolved Hospital Problem list     Assessment & Plan:  Severe emphysema and RUL 1 cm suspicious pulm nodule. No LNE HTN IBS (irritable)  GERD Active tobacco smoking  PLAN: Outpatient PFT and PET scan, if positive, he needs Navigational bronchoscopy, bx, possible EBUS. If negative PET scan, re CT chest after 3 months.  Atrovent  scheduled  SABA PRN Tobacco smoking cessation counseling was provided. Helping with meds to quit can be done in the pulm clinic.  Thank you   Best Practice (right click and "Reselect all SmartList Selections" daily)   Labs   CBC: Recent Labs  Lab 05/28/23 1418 05/29/23 0339  WBC 8.2 9.2  NEUTROABS 4.7  --   HGB 15.3 15.4  HCT 44.7 45.8  MCV 92.7 90.7  PLT 204 202    Basic Metabolic Panel: Recent Labs  Lab 05/28/23 1418  NA 136  K 3.8  CL 104  CO2 24  GLUCOSE 120*  BUN 12  CREATININE 0.94  CALCIUM 8.8*   GFR: Estimated Creatinine Clearance: 71.3 mL/min (by C-G formula based on SCr of 0.94 mg/dL). Recent Labs  Lab 05/28/23 1418 05/29/23 0339  WBC 8.2 9.2    Liver Function Tests: Recent Labs  Lab 05/28/23 1418  AST 17  ALT 10  ALKPHOS 78  BILITOT 0.4  PROT 6.9  ALBUMIN 4.0   Recent Labs  Lab 05/28/23 1418  LIPASE 30   No results for input(s): "AMMONIA" in the last 168 hours.  ABG No results found for: "PHART", "PCO2ART", "PO2ART", "HCO3", "TCO2", "ACIDBASEDEF", "O2SAT"   Coagulation Profile: Recent Labs  Lab 05/28/23 1418  INR 1.0    Cardiac Enzymes: No results for input(s): "CKTOTAL", "CKMB", "CKMBINDEX", "TROPONINI" in the last 168 hours.  HbA1C: No results found for: "HGBA1C"  CBG: No results for input(s): "  GLUCAP" in the last 168 hours.  Review of Systems:   Review of Systems  Constitutional:  Negative for chills, diaphoresis, fever, malaise/fatigue and weight loss.  HENT:  Negative for congestion, sinus pain and sore throat.   Respiratory:  Negative for cough, hemoptysis, sputum production, shortness of breath, wheezing and stridor.   Cardiovascular:  Positive for chest pain. Negative for orthopnea and leg swelling.  Gastrointestinal:  Negative for abdominal pain, nausea and vomiting.  Skin:  Negative for itching and rash.     Past Medical History:  He,  has a past medical history of Arthritis, GERD (gastroesophageal  reflux disease), Headache, and IBS (irritable bowel syndrome).   Surgical History:   Past Surgical History:  Procedure Laterality Date   BIOPSY  11/17/2021   Procedure: BIOPSY;  Surgeon: Urban Garden, MD;  [facility];  Service: Gastroenterology;;   COLONOSCOPY WITH PROPOFOL  N/A 11/17/2021   Procedure: COLONOSCOPY WITH PROPOFOL ;  Surgeon: Urban Garden, MD;  [facility];  Service: Gastroenterology;  Laterality: N/A;  200 ASA 2   ESOPHAGOGASTRODUODENOSCOPY N/A 04/24/2023   Procedure: EGD (ESOPHAGOGASTRODUODENOSCOPY);  Surgeon: Umberto Ganong, Bearl Limes, MD;  [facility];  Service: Gastroenterology;  Laterality: N/A;  9:15AM;ASA 2   ESOPHAGOGASTRODUODENOSCOPY (EGD) WITH PROPOFOL  N/A 11/17/2021   Procedure: ESOPHAGOGASTRODUODENOSCOPY (EGD) WITH PROPOFOL ;  Surgeon: Urban Garden, MD;  [facility];  Service: Gastroenterology;  Laterality: N/A;   INGUINAL HERNIA REPAIR Right 06/02/2015   Procedure: RIGHT INGUINAL HERNIA REPAIR WITH MESH;  Surgeon: Aldean Hummingbird, MD;  Location[facility];  Service: General;  Laterality: Right;   INSERTION OF MESH Right 06/02/2015   Procedure: INSERTION OF MESH;  Surgeon: Aldean Hummingbird, MD;  Location: [facility] OR;  Service: General;  Laterality: Right;   NO PAST SURGERIES     POLYPECTOMY  11/17/2021   Procedure: POLYPECTOMY;  Surgeon: Urban Garden, MD;  [facility];  Service: Gastroenterology;;     Social History:   reports that he has been smoking cigarettes. He has been exposed to tobacco smoke. He has quit using smokeless tobacco. He reports that he does not drink alcohol and does not use drugs.   Family History:  His family history includes Diabetes in his mother; Heart disease in his father; Osteoarthritis in his mother.   Allergies Allergies  Allergen Reactions   Penicillins Rash    Immediate rash, facial/tongue/throat swelling, SOB or lightheadedness with  hypotension      Home Medications  Prior to Admission medications   Medication Sig Start Date End Date Taking? Authorizing Provider  dicyclomine  (BENTYL ) 10 MG capsule TAKE 1 CAPSULE (10 MG TOTAL) BY MOUTH EVERY 12 (TWELVE) HOURS AS NEEDED (ABDOMINAL PAIN). 02/21/23  Yes Urban Garden, MD  diphenoxylate -atropine  (LOMOTIL ) 2.5-0.025 MG tablet TAKE 1 TABLET BY MOUTH 4 (FOUR) TIMES DAILY AS NEEDED FOR DIARRHEA OR LOOSE STOOLS. 05/15/23  Yes Carlan, Chelsea L, NP  MAGNESIUM PO Take 1 tablet by mouth at bedtime.   Yes [provider]  Multiple Vitamin (MULTIVITAMIN) tablet Take 1 tablet by mouth at bedtime.   Yes [provider]  omeprazole  (PRILOSEC) 40 MG capsule Take 1 capsule (40 mg total) by mouth daily. 04/24/23  Yes Urban Garden, MD  ondansetron  (ZOFRAN ) 4 MG tablet TAKE 1 TABLET BY MOUTH EVERY 8 HOURS AS NEEDED FOR NAUSEA AND VOMITING 01/25/23  Yes Carlan, Chelsea L, NP  promethazine  (PHENERGAN ) 12.5 MG tablet TAKE 1 TABLET BY MOUTH EVERY 6 HOURS  AS NEEDED FOR NAUSEA OR VOMITING. 05/27/23  Yes Carlan, Chelsea L, NP  sucralfate  (CARAFATE ) 1 g tablet Take 1 tablet (1 g total) by mouth 4 (four) times daily -  with meals and at bedtime. Dissolve tablet in 30ml of water, then drink slurry 04/02/23  Yes Carlan, Chelsea L, NP  traMADol (ULTRAM) 50 MG tablet Take 50 mg by mouth every 6 (six) hours as needed for moderate pain (pain score 4-6). 03/21/23  Yes [provider]    Madelynn Schilder, MD [facility] Office: [phone]    See Amion for personal pager PCCM on call pager [phone] until 7pm. Please call Elink 7p-7a. [phone]

## 2023-05-29 NOTE — Progress Notes (Signed)
  Echocardiogram 2D Echocardiogram has been performed.  Reginald [name] 05/29/2023, 2:48 PM

## 2023-05-29 NOTE — Progress Notes (Signed)
 PHARMACY - ANTICOAGULATION CONSULT NOTE  Pharmacy Consult for Heparin Indication: chest pain/ACS  Allergies  Allergen Reactions   Penicillins Rash    Immediate rash, facial/tongue/throat swelling, SOB or lightheadedness with hypotension     Patient Measurements: Height: 5\' 8"  (172.7 cm) Weight: 58.1 kg (128 lb 1.4 oz) IBW/kg (Calculated) : 68.4 HEPARIN DW (KG): 58.1  Vital Signs: Temp: 98 F (36.7 C) (05/07 1124) Temp Source: Oral (05/07 1124) BP: 138/84 (05/07 1124) Pulse Rate: 72 (05/07 1124)  Labs: Recent Labs    05/28/23 1418 05/28/23 1548 05/29/23 0339 05/29/23 1345  HGB 15.3  --  15.4  --   HCT 44.7  --  45.8  --   PLT 204  --  202  --   APTT 31  --   --   --   LABPROT 13.3  --   --   --   INR 1.0  --   --   --   HEPARINUNFRC  --   --  0.16* 0.30  CREATININE 0.94  --   --   --   TROPONINIHS 17 15  --   --     Estimated Creatinine Clearance: 71.3 mL/min (by C-G formula based on SCr of 0.94 mg/dL).   Medical History: Past Medical History:  Diagnosis Date   Arthritis    GERD (gastroesophageal reflux disease)    Headache    migraines    IBS (irritable bowel syndrome)     Medications:  See med rec  Assessment: 58 y.o. male presents with several weeks of intermittent chest pain. Comes with exertion but also at rest. Endorses SOB and diaphoresis with nausea and vomiting when it arises. Radiates into his left neck, left chest, and left arm . Concern for accelerating angina. Patient not on any anticoagulation. Pharmacy asked to start heparin  Heparin level this afternoon is within goal range at 0.3, no overt bleeding or complications noted.  Goal of Therapy:  Heparin level 0.3-0.7 units/ml Monitor platelets by anticoagulation protocol: Yes   Plan:  Continue IV heparin at 1050 units/hrh Will f/u plans for heparin after cath this afternoon.  Joanell Mowers, Davey Erp, BCCP Clinical Pharmacist  05/29/2023 2:39 PM   [facility] pharmacy phone numbers are  listed on [email or web address]

## 2023-05-29 NOTE — H&P (View-Only) (Signed)
   Patient Name: Reginald [name] Date of Encounter: 05/29/2023 [facility] Cardiologist: New  Interval Summary  .    Patient resting comfortably in bed with family present Still having mild chest discomfort but much improved from before  Waiting on pulmonary consult to see if they are needing to do a biopsy and will guide timing of LHC   Vital Signs .    Vitals:   05/28/23 1830 05/28/23 1948 05/28/23 2332 05/29/23 0342  BP: 136/84 (!) 150/96 134/83 (!) 142/95  Pulse: 85 82 66 (!) 59  Resp: 17 19 16 16   Temp:  97.9 F (36.6 C) 98 F (36.7 C) 97.6 F (36.4 C)  TempSrc:  Oral Oral Oral  SpO2: 94% 97% 96% 96%  Weight:  58.1 kg    Height:  5\' 8"  (1.727 m)      Intake/Output Summary (Last 24 hours) at 05/29/2023 0838 Last data filed at 05/29/2023 0344 Gross per 24 hour  Intake 1080 ml  Output --  Net 1080 ml      05/28/2023    7:48 PM 05/28/2023    1:43 PM 04/24/2023    7:59 AM  Last 3 Weights  Weight (lbs) 128 lb 1.4 oz 141 lb 1.5 oz 140 lb  Weight (kg) 58.1 kg 64 kg 63.504 kg     Telemetry/ECG    Sinus rhythm, HR 80-90s - Personally Reviewed  Physical Exam .   GEN: No acute distress, on room air.   Neck: No JVD Cardiac: RRR, no murmurs, rubs, or gallops.  Respiratory: Clear to auscultation bilaterally. GI: Soft, nontender, non-distended  MS: No edema  Assessment & Plan .   Reginald [name] is a 58 y.o. male with past medical history of GERD, migraines, IBS, arthritis who presented to [facility] with intermittent chest pain. He was transferred to [facility] to undergo pulmonary consultation for incidental finding of lung nodule. Pending results of pulmonary consultation will guide cardiac evaluation.   Unstable angina  Patient presented with chest pain x 6 months, worse last month occurring at rest last 2 weeks EKG showed subtle STE in aVR, V1 and diffuse ST depressions which are not new Troponin levels 17 > 15 Chest CT showed 1 cm pulmonary nodule in right  apex possibly malignant  Waiting to see if pulmonary needs to do a biopsy -- this will guide decision of when to do LHC Continue ASA 81 mg daily Continue amlodipine 5 mg daily  Continue IV heparin  Start Crestor 20 mg daily  Pending echocardiogram, lipid panel, A1C   Hypertension Most recent BP 142/95 prior to getting morning medications Continue amlodipine as above Monitor response and titrate/add on medications as needed   Per primary Lung nodule Severe emphysema  Nicotine abuse  IBS GERD  For questions or updates, please contact [facility] Please consult [email or web address] for contact info under        Signed, Jiles Mote, PA-C   I have personally seen and examined this patient. I agree with the assessment and plan as outlined above.  Pt with unstable angina. Negative troponin. [facility] for cath.  Pulmonary evaluation today and they recommend proceeding with his cardiac cath and then outpatient pulmonary workup.   Antoinette Batman, MD, [facility] 05/29/2023 10:42 AM

## 2023-05-30 ENCOUNTER — Telehealth (HOSPITAL_COMMUNITY): Payer: Self-pay | Admitting: Pharmacy Technician

## 2023-05-30 ENCOUNTER — Inpatient Hospital Stay (HOSPITAL_COMMUNITY)

## 2023-05-30 ENCOUNTER — Encounter (HOSPITAL_COMMUNITY): Payer: Self-pay | Admitting: Cardiology

## 2023-05-30 ENCOUNTER — Other Ambulatory Visit (HOSPITAL_COMMUNITY): Payer: Self-pay

## 2023-05-30 DIAGNOSIS — I2510 Atherosclerotic heart disease of native coronary artery without angina pectoris: Secondary | ICD-10-CM

## 2023-05-30 DIAGNOSIS — I200 Unstable angina: Secondary | ICD-10-CM | POA: Diagnosis not present

## 2023-05-30 LAB — LIPID PANEL
Cholesterol: 223 mg/dL — ABNORMAL HIGH (ref 0–200)
HDL: 38 mg/dL — ABNORMAL LOW (ref >40)
LDL Cholesterol: 162 mg/dL — ABNORMAL HIGH (ref 0–99)
Total CHOL/HDL Ratio: 5.9 ratio
Triglycerides: 116 mg/dL (ref <150)
VLDL: 23 mg/dL (ref 0–40)

## 2023-05-30 LAB — BASIC METABOLIC PANEL WITH GFR
Anion gap: 8 (ref 5–15)
BUN: 13 mg/dL (ref 6–20)
CO2: 26 mmol/L (ref 22–32)
Calcium: 9.3 mg/dL (ref 8.9–10.3)
Chloride: 105 mmol/L (ref 98–111)
Creatinine, Ser: 0.99 mg/dL (ref 0.61–1.24)
GFR, Estimated: >60 mL/min (ref >60)
Glucose, Bld: 106 mg/dL — ABNORMAL HIGH (ref 70–99)
Potassium: 4.4 mmol/L (ref 3.5–5.1)
Sodium: 139 mmol/L (ref 135–145)

## 2023-05-30 LAB — CBC
HCT: 49.9 % (ref 39.0–52.0)
Hemoglobin: 16.7 g/dL (ref 13.0–17.0)
MCH: 30.6 pg (ref 26.0–34.0)
MCHC: 33.5 g/dL (ref 30.0–36.0)
MCV: 91.4 fL (ref 80.0–100.0)
Platelets: 222 10*3/uL (ref 150–400)
RBC: 5.46 MIL/uL (ref 4.22–5.81)
RDW: 12.9 % (ref 11.5–15.5)
WBC: 8.9 10*3/uL (ref 4.0–10.5)
nRBC: 0 % (ref 0.0–0.2)

## 2023-05-30 LAB — URINALYSIS, ROUTINE W REFLEX MICROSCOPIC
Bilirubin Urine: NEGATIVE
Glucose, UA: NEGATIVE mg/dL
Hgb urine dipstick: NEGATIVE
Ketones, ur: NEGATIVE mg/dL
Leukocytes,Ua: NEGATIVE
Nitrite: NEGATIVE
Protein, ur: NEGATIVE mg/dL
Specific Gravity, Urine: 1.01 (ref 1.005–1.030)
pH: 6 (ref 5.0–8.0)

## 2023-05-30 LAB — BLOOD GAS, ARTERIAL
Acid-Base Excess: 1.4 mmol/L (ref 0.0–2.0)
Bicarbonate: 24.9 mmol/L (ref 20.0–28.0)
O2 Saturation: 98.8 %
Patient temperature: 37
pCO2 arterial: 35 mmHg (ref 32–48)
pH, Arterial: 7.46 — ABNORMAL HIGH (ref 7.35–7.45)
pO2, Arterial: 92 mmHg (ref 83–108)

## 2023-05-30 LAB — TYPE AND SCREEN
ABO/RH(D): A NEG
Antibody Screen: NEGATIVE

## 2023-05-30 LAB — HEPARIN LEVEL (UNFRACTIONATED)
Heparin Unfractionated: 0.21 [IU]/mL — ABNORMAL LOW (ref 0.30–0.70)
Heparin Unfractionated: 0.33 [IU]/mL (ref 0.30–0.70)

## 2023-05-30 LAB — SURGICAL PCR SCREEN
MRSA, PCR: NEGATIVE
Staphylococcus aureus: NEGATIVE

## 2023-05-30 LAB — ABO/RH: ABO/RH(D): A NEG

## 2023-05-30 MED ORDER — TRANEXAMIC ACID (OHS) BOLUS VIA INFUSION
15.0000 mg/kg | INTRAVENOUS | Status: AC
  Administered 2023-05-31: 871.5 mg via INTRAVENOUS
  Filled 2023-05-30: qty 872

## 2023-05-30 MED ORDER — CHLORHEXIDINE GLUCONATE CLOTH 2 % EX PADS
6.0000 | MEDICATED_PAD | Freq: Once | CUTANEOUS | Status: AC
  Administered 2023-05-31: 6 via TOPICAL

## 2023-05-30 MED ORDER — MILRINONE LACTATE IN DEXTROSE 20-5 MG/100ML-% IV SOLN
0.3000 ug/kg/min | INTRAVENOUS | Status: DC
  Filled 2023-05-30: qty 100

## 2023-05-30 MED ORDER — CEFAZOLIN SODIUM-DEXTROSE 2-4 GM/100ML-% IV SOLN
2.0000 g | INTRAVENOUS | Status: AC
Start: 2023-05-31 — End: 2023-05-31
  Administered 2023-05-31 (×2): 2 g via INTRAVENOUS
  Filled 2023-05-30: qty 100

## 2023-05-30 MED ORDER — NITROGLYCERIN IN D5W 200-5 MCG/ML-% IV SOLN
2.0000 ug/min | INTRAVENOUS | Status: DC
  Filled 2023-05-30: qty 250

## 2023-05-30 MED ORDER — TRANEXAMIC ACID 1000 MG/10ML IV SOLN
1.5000 mg/kg/h | INTRAVENOUS | Status: AC
  Administered 2023-05-31: 1.5 mg/kg/h via INTRAVENOUS
  Filled 2023-05-30: qty 25

## 2023-05-30 MED ORDER — DEXMEDETOMIDINE HCL IN NACL 400 MCG/100ML IV SOLN
0.1000 ug/kg/h | INTRAVENOUS | Status: AC
Start: 2023-05-31 — End: 2023-05-31
  Administered 2023-05-31: .7 ug/kg/h via INTRAVENOUS
  Filled 2023-05-30: qty 100

## 2023-05-30 MED ORDER — CEFAZOLIN SODIUM-DEXTROSE 2-4 GM/100ML-% IV SOLN
2.0000 g | INTRAVENOUS | Status: DC
  Filled 2023-05-30: qty 100

## 2023-05-30 MED ORDER — AMLODIPINE BESYLATE 5 MG PO TABS
5.0000 mg | ORAL_TABLET | Freq: Every day | ORAL | Status: DC
  Administered 2023-05-30: 5 mg via ORAL
  Filled 2023-05-30: qty 1

## 2023-05-30 MED ORDER — EPINEPHRINE HCL 5 MG/250ML IV SOLN IN NS
0.0000 ug/min | INTRAVENOUS | Status: AC
  Administered 2023-05-31: 4 ug/min via INTRAVENOUS
  Filled 2023-05-30: qty 250

## 2023-05-30 MED ORDER — NOREPINEPHRINE 4 MG/250ML-% IV SOLN
0.0000 ug/min | INTRAVENOUS | Status: DC
  Filled 2023-05-30: qty 250

## 2023-05-30 MED ORDER — CHLORHEXIDINE GLUCONATE 0.12 % MT SOLN
15.0000 mL | Freq: Once | OROMUCOSAL | Status: AC
  Administered 2023-05-31: 15 mL via OROMUCOSAL
  Filled 2023-05-30: qty 15

## 2023-05-30 MED ORDER — POTASSIUM CHLORIDE 2 MEQ/ML IV SOLN
80.0000 meq | INTRAVENOUS | Status: DC
  Filled 2023-05-30: qty 40

## 2023-05-30 MED ORDER — PHENYLEPHRINE HCL-NACL 20-0.9 MG/250ML-% IV SOLN
30.0000 ug/min | INTRAVENOUS | Status: AC
  Administered 2023-05-31: 30 ug/min via INTRAVENOUS
  Filled 2023-05-30: qty 250

## 2023-05-30 MED ORDER — METOPROLOL TARTRATE 12.5 MG HALF TABLET
12.5000 mg | ORAL_TABLET | Freq: Once | ORAL | Status: AC
  Administered 2023-05-31: 12.5 mg via ORAL
  Filled 2023-05-30: qty 1

## 2023-05-30 MED ORDER — CHLORHEXIDINE GLUCONATE CLOTH 2 % EX PADS
6.0000 | MEDICATED_PAD | Freq: Once | CUTANEOUS | Status: AC
  Administered 2023-05-30: 6 via TOPICAL

## 2023-05-30 MED ORDER — MAGNESIUM SULFATE 50 % IJ SOLN
40.0000 meq | INTRAMUSCULAR | Status: DC
  Filled 2023-05-30: qty 9.85

## 2023-05-30 MED ORDER — HEPARIN 30,000 UNITS/1000 ML (OHS) CELLSAVER SOLUTION
Status: DC
  Filled 2023-05-30 (×2): qty 1000

## 2023-05-30 MED ORDER — INSULIN REGULAR(HUMAN) IN NACL 100-0.9 UT/100ML-% IV SOLN
INTRAVENOUS | Status: AC
  Administered 2023-05-31: 3.4 [IU]/h via INTRAVENOUS
  Filled 2023-05-30: qty 100

## 2023-05-30 MED ORDER — ALPRAZOLAM 0.25 MG PO TABS
0.2500 mg | ORAL_TABLET | ORAL | Status: DC | PRN
  Administered 2023-05-30: 0.5 mg via ORAL
  Filled 2023-05-30: qty 2

## 2023-05-30 MED ORDER — TRANEXAMIC ACID (OHS) PUMP PRIME SOLUTION
2.0000 mg/kg | INTRAVENOUS | Status: DC
  Filled 2023-05-30: qty 1.16

## 2023-05-30 MED ORDER — AMLODIPINE BESYLATE 10 MG PO TABS: 10.0000 mg | ORAL_TABLET | Freq: Every day | ORAL | Status: DC

## 2023-05-30 MED ORDER — NITROGLYCERIN 0.4 MG SL SUBL
0.4000 mg | SUBLINGUAL_TABLET | SUBLINGUAL | Status: DC | PRN
  Administered 2023-05-30 (×2): 0.4 mg via SUBLINGUAL
  Filled 2023-05-30: qty 1

## 2023-05-30 MED ORDER — CARVEDILOL 3.125 MG PO TABS
3.1250 mg | ORAL_TABLET | Freq: Two times a day (BID) | ORAL | Status: DC
  Administered 2023-05-30 (×2): 3.125 mg via ORAL
  Filled 2023-05-30 (×2): qty 1

## 2023-05-30 MED ORDER — VANCOMYCIN HCL 1250 MG/250ML IV SOLN
1250.0000 mg | INTRAVENOUS | Status: AC
  Administered 2023-05-31: 1250 mg via INTRAVENOUS
  Filled 2023-05-30: qty 250

## 2023-05-30 MED ORDER — PLASMA-LYTE A IV SOLN
INTRAVENOUS | Status: DC
  Filled 2023-05-30 (×2): qty 2.5

## 2023-05-30 NOTE — Progress Notes (Signed)
 Progress Note   Patient: Reginald [name] WGN:[id number] DOB: [birth year] DOA: 05/28/2023     2 DOS: the patient was seen and examined on 05/30/2023   Brief hospital course: 58 year old man with PMH of arthritis, GERD, IBS who presented to the ED with anginal chest pain.  Cardiology was consulted.  CT chest revealed a right pulmonary nodule concerning for malignancy and pulmonology was consulted. 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  Cardiothoracic surgery was consulted.  Assessment and Plan:  ACS/unstable angina Troponins were negative (17 -> 15). EKG was unchanged from prior. HbA1c: 5.4. Lipid panel:TC: 223; LDL: 162; HDL: 38 Cardiology was consulted. Patient started on aspirin, heparin GTT. Echocardiogram showed EF 35-40% with regional wall motion abnormalities.  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 Likely 5/9.  - Continue heparin gtt. - continue statin, aspirin.   Pulm Nodule: R apex.  Concerning for malignancy.  Pt w/ >60pack years of tobacco use.  Asymptomatic. New finding.  Pulmonology consulted and recommended outpatient PFT and PET scan. If positive, he needs Navigational bronchoscopy, bx, possible EBUS. If negative PET scan, re CT chest after 3 months.  -Outpatient follow-up as above.   COPD Due to smoking. No respiratory decompensation -Will continue to monitor   HTN Poorly controlled -Per cards rec, start Norvasc 5mg    Tobacco use Currently smokes 1.5ppd (as many as 2.5ppd). 60+pack years.  - Nicotine patch.    IBS/GERD - continue Bentyl ,PPI      Subjective: Patient is feeling well. Had some chest pain last night. Responded to nitroglycerin.   Physical Exam: Vitals:   05/30/23 0435 05/30/23 0816 05/30/23 1038 05/30/23 1039  BP: (!) 150/88 138/85 (!) 138/91   Pulse: 62 74  76  Resp: 18 19    Temp: 97.8 F (36.6 C) 97.6 F (36.4 C)    TempSrc:  Oral Oral    SpO2: 97% 97%    Weight:      Height:       Physical Exam   General: Alert, oriented X3  Eyes: Pupils equal, reactive  Oral cavity: moist mucous membranes  Head: Atraumatic, normocephalic  Neck: supple  Chest: Diminished breath sounds CVS: S1,S2 RRR. No murmurs  Abd: No distention, soft, non-tender. No masses palpable  Extr: No edema   MSK: No joint deformities or swelling  Neurological: Grossly intact.    Data Reviewed:      Latest Ref Rng & Units 05/30/2023    4:45 AM 05/29/2023    3:39 AM 05/28/2023    2:18 PM  CBC  WBC 4.0 - 10.5 K/uL 8.9  9.2  8.2   Hemoglobin 13.0 - 17.0 g/dL 78.4  69.6  29.5   Hematocrit 39.0 - 52.0 % 49.9  45.8  44.7   Platelets 150 - 400 K/uL 222  202  204       Latest Ref Rng & Units 05/30/2023    4:45 AM 05/28/2023    2:18 PM 08/16/2021   12:37 PM  BMP  Glucose 70 - 99 mg/dL 284  132  440   BUN 6 - 20 mg/dL 13  12  13    Creatinine 0.61 - 1.24 mg/dL 1.02  7.25  3.66   Sodium 135 - 145 mmol/L 139  136  142   Potassium 3.5 - 5.1 mmol/L 4.4  3.8  4.3   Chloride 98 - 111 mmol/L 105  104  107   CO2 22 - 32 mmol/L 26  24  27    Calcium 8.9 - 10.3 mg/dL 9.3  8.8  9.2      Family Communication: Significant other at bedside  Disposition: Status is: Inpatient Remains inpatient appropriate because: ACS on heparin gtt.  Planned Discharge Destination: Home    Time spent: 35 minutes  Author: MDALA-GAUSI, Yeira Gulden AGATHA, MD 05/30/2023 12:40 PM  For on call review [email or web address].

## 2023-05-30 NOTE — Progress Notes (Signed)
 05/29/2023  20:07 BP 145/93 20:21 1 SL nitroglycerin given for 6/10 chest tightness 20:30 BP 127/74 & CP now 2/10

## 2023-05-30 NOTE — Progress Notes (Addendum)
 Rounding Note    Patient Name: Reginald [name] Date of Encounter: 05/30/2023  [facility] Cardiologist: Vishnu P Mallipeddi, MD   Subjective   Patient resting comfortably with family present Aware of his cath results -- awaiting to be seen by CT surgery Denies any anginal symptoms this morning   Inpatient Medications    Scheduled Meds:  amLODipine  5 mg Oral Daily   aspirin EC  81 mg Oral Daily   carvedilol  3.125 mg Oral BID   nicotine  21 mg Transdermal Daily   pantoprazole   40 mg Oral Daily   rosuvastatin  20 mg Oral Daily   sodium chloride  flush  3 mL Intravenous Q12H   sucralfate   1 g Oral TID WC & HS   Continuous Infusions:  sodium chloride      heparin 1,050 Units/hr (05/29/23 2237)   PRN Meds: sodium chloride , acetaminophen , dicyclomine , diphenoxylate -atropine , ipratropium, levalbuterol, nicotine polacrilex, ondansetron  (ZOFRAN ) IV, promethazine , sodium chloride  flush, traMADol   Vital Signs    Vitals:   05/29/23 2030 05/29/23 2347 05/30/23 0435 05/30/23 0816  BP: 127/74 (!) 144/98 (!) 150/88 138/85  Pulse: 73 62 62 74  Resp:  18 18 19   Temp:  97.8 F (36.6 C) 97.8 F (36.6 C) 97.6 F (36.4 C)  TempSrc:  Oral Oral Oral  SpO2: 97% 98% 97% 97%  Weight:      Height:        Intake/Output Summary (Last 24 hours) at 05/30/2023 0850 Last data filed at 05/29/2023 2237 Gross per 24 hour  Intake 1189.66 ml  Output --  Net 1189.66 ml      05/28/2023    7:48 PM 05/28/2023    1:43 PM 04/24/2023    7:59 AM  Last 3 Weights  Weight (lbs) 128 lb 1.4 oz 141 lb 1.5 oz 140 lb  Weight (kg) 58.1 kg 64 kg 63.504 kg      Telemetry    Sinus rhythm, HR 70-80s - Personally Reviewed  Physical Exam   GEN: No acute distress.   Neck: No JVD Cardiac: RRR, no murmurs, rubs, or gallops.  Respiratory: Clear to auscultation bilaterally. GI: Soft, nontender, non-distended  MS: No edema; No deformity. Neuro:  Nonfocal  Psych: Normal affect   Labs    High  Sensitivity Troponin:   Recent Labs  Lab 05/28/23 1418 05/28/23 1548  TROPONINIHS 17 15     Chemistry Recent Labs  Lab 05/28/23 1418 05/30/23 0445  NA 136 139  K 3.8 4.4  CL 104 105  CO2 24 26  GLUCOSE 120* 106*  BUN 12 13  CREATININE 0.94 0.99  CALCIUM 8.8* 9.3  PROT 6.9  --   ALBUMIN 4.0  --   AST 17  --   ALT 10  --   ALKPHOS 78  --   BILITOT 0.4  --   GFRNONAA >60 >60  ANIONGAP 8 8    Lipids  Recent Labs  Lab 05/30/23 0445  CHOL 223*  TRIG 116  HDL 38*  LDLCALC 162*  CHOLHDL 5.9    Hematology Recent Labs  Lab 05/28/23 1418 05/29/23 0339 05/30/23 0445  WBC 8.2 9.2 8.9  RBC 4.82 5.05 5.46  HGB 15.3 15.4 16.7  HCT 44.7 45.8 49.9  MCV 92.7 90.7 91.4  MCH 31.7 30.5 30.6  MCHC 34.2 33.6 33.5  RDW 12.7 12.8 12.9  PLT 204 202 222   Thyroid No results for input(s): "TSH", "FREET4" in the last 168 hours.  BNP Recent Labs  Lab 05/28/23 1418  BNP 175.0*    DDimer No results for input(s): "DDIMER" in the last 168 hours.   Radiology    ECHOCARDIOGRAM COMPLETE Result Date: 05/29/2023    ECHOCARDIOGRAM REPORT   Patient Name:   Reginald [name] Date of Exam: 05/29/2023 Medical Rec #:  [id number]    Height:       68.0 in Accession #:    [id number]   Weight:       128.1 lb Date of Birth:  08/01/1965    BSA:          1.691 m Patient Age:    58 years     BP:           155/90 mmHg Patient Gender: M            HR:           70 bpm. Exam Location:  Inpatient Procedure: 2D Echo, Cardiac Doppler, Color Doppler and Intracardiac            Opacification Agent (Both Spectral and Color Flow Doppler were            utilized during procedure). Indications:    R07.9* Chest pain, unspecified  History:        Patient has no prior history of Echocardiogram examinations.                 Signs/Symptoms:Chest Pain; Risk Factors:Hypertension.  Sonographer:    Raynelle Callow RDCS Referring Phys: [id number] VISHNU P MALLIPEDDI  Sonographer Comments: Suboptimal parasternal window. IMPRESSIONS  1. Left  ventricular ejection fraction, by estimation, is 35 to 40%. The left ventricle has moderately decreased function. The left ventricle demonstrates regional wall motion abnormalities (see scoring diagram/findings for description). Left ventricular  diastolic parameters are consistent with Grade I diastolic dysfunction (impaired relaxation).  2. Right ventricular systolic function is normal. The right ventricular size is normal. Tricuspid regurgitation signal is inadequate for assessing PA pressure.  3. The mitral valve is normal in structure. No evidence of mitral valve regurgitation. No evidence of mitral stenosis.  4. The aortic valve is normal in structure. Aortic valve regurgitation is not visualized. No aortic stenosis is present.  5. The inferior vena cava is normal in size with greater than 50% respiratory variability, suggesting right atrial pressure of 3 mmHg. FINDINGS  Left Ventricle: Left ventricular ejection fraction, by estimation, is 35 to 40%. The left ventricle has moderately decreased function. The left ventricle demonstrates regional wall motion abnormalities. Definity contrast agent was given IV to delineate the left ventricular endocardial borders. The left ventricular internal cavity size was normal in size. There is no left ventricular hypertrophy. Left ventricular diastolic parameters are consistent with Grade I diastolic dysfunction (impaired relaxation).  LV Wall Scoring: The mid and distal lateral wall, posterior wall, and mid anterolateral segment are akinetic. Right Ventricle: The right ventricular size is normal. No increase in right ventricular wall thickness. Right ventricular systolic function is normal. Tricuspid regurgitation signal is inadequate for assessing PA pressure. Left Atrium: Left atrial size was normal in size. Right Atrium: Right atrial size was normal in size. Pericardium: There is no evidence of pericardial effusion. Mitral Valve: The mitral valve is normal in structure.  No evidence of mitral valve regurgitation. No evidence of mitral valve stenosis. Tricuspid Valve: The tricuspid valve is normal in structure. Tricuspid valve regurgitation is not demonstrated. No evidence of tricuspid stenosis. Aortic Valve: The aortic valve is normal in structure.  Aortic valve regurgitation is not visualized. No aortic stenosis is present. Pulmonic Valve: The pulmonic valve was normal in structure. Pulmonic valve regurgitation is not visualized. No evidence of pulmonic stenosis. Aorta: The aortic root is normal in size and structure. Venous: The inferior vena cava is normal in size with greater than 50% respiratory variability, suggesting right atrial pressure of 3 mmHg. IAS/Shunts: No atrial level shunt detected by color flow Doppler.  LEFT VENTRICLE PLAX 2D LVIDd:         4.50 cm      Diastology LVIDs:         3.60 cm      LV e' medial:    6.09 cm/s LV PW:         1.20 cm      LV E/e' medial:  8.6 LV IVS:        0.90 cm      LV e' lateral:   6.09 cm/s LVOT diam:     2.00 cm      LV E/e' lateral: 8.6 LV SV:         52 LV SV Index:   31 LVOT Area:     3.14 cm  LV Volumes (MOD) LV vol d, MOD A2C: 113.0 ml LV vol d, MOD A4C: 112.0 ml LV vol s, MOD A2C: 71.3 ml LV vol s, MOD A4C: 70.0 ml LV SV MOD A2C:     41.7 ml LV SV MOD A4C:     112.0 ml LV SV MOD BP:      39.6 ml RIGHT VENTRICLE TAPSE (M-mode): 1.5 cm LEFT ATRIUM             Index        RIGHT ATRIUM           Index LA diam:        1.80 cm 1.06 cm/m   RA Area:     11.00 cm LA Vol (A2C):   30.1 ml 17.80 ml/m  RA Volume:   22.90 ml  13.54 ml/m LA Vol (A4C):   15.8 ml 9.34 ml/m LA Biplane Vol: 22.5 ml 13.30 ml/m  AORTIC VALVE LVOT Vmax:   98.50 cm/s LVOT Vmean:  60.300 cm/s LVOT VTI:    0.167 m  AORTA Ao Root diam: 2.80 cm Ao Asc diam:  2.20 cm MITRAL VALVE MV Area (PHT): 3.13 cm    SHUNTS MV Decel Time: 243 msec    Systemic VTI:  0.17 m MV E velocity: 52.40 cm/s  Systemic Diam: 2.00 cm MV A velocity: 60.55 cm/s MV E/A ratio:  0.87 Dorothye Gathers MD Electronically signed by Dorothye Gathers MD Signature Date/Time: 05/29/2023/4:39:45 PM    Final    CARDIAC CATHETERIZATION Result Date: 05/29/2023   Ost LAD to Prox LAD lesion is 70% stenosed.   Mid LAD lesion is 80% stenosed.   1st Mrg lesion is 95% stenosed.   Prox Cx to Mid Cx lesion is 60% stenosed.   Prox RCA to Mid RCA lesion is 90% stenosed.   LV end diastolic pressure is mildly elevated. 3 vessel obstructive CAD Mildly elevated LVEDP Plan: will resume IV heparin. CT surgery consultation.   CT ANGIO CHEST/ABD/PEL FOR DISSECTION W &/OR WO CONTRAST Result Date: 05/28/2023 CLINICAL DATA:  Persistent chest pain radiating to arm * Tracking Code: BO * EXAM: CT ANGIOGRAPHY CHEST, ABDOMEN AND PELVIS TECHNIQUE: Non-contrast CT of the chest was initially obtained. Multidetector CT imaging through the chest, abdomen and pelvis was performed  using the standard protocol during bolus administration of intravenous contrast. Multiplanar reconstructed images and MIPs were obtained and reviewed to evaluate the vascular anatomy. RADIATION DOSE REDUCTION: This exam was performed according to the departmental dose-optimization program which includes automated exposure control, adjustment of the mA and/or kV according to patient size and/or use of iterative reconstruction technique. CONTRAST:  OMNIPAQUE  IOHEXOL  350 MG/ML SOLN COMPARISON:  None Available. FINDINGS: CTA CHEST FINDINGS VASCULAR Aorta: Satisfactory opacification of the aorta. Normal contour and caliber of the thoracic aorta. No evidence of aneurysm, dissection, or other acute aortic pathology. Mild mixed calcific atherosclerosis of the thoracic aorta. Cardiovascular: No evidence of pulmonary embolism on limited non-tailored examination. Normal heart size. Left coronary artery calcifications. No pericardial effusion. Review of the MIP images confirms the above findings. NON VASCULAR Mediastinum/Nodes: No enlarged mediastinal, hilar, or axillary lymph  nodes. Thyroid gland, trachea, and esophagus demonstrate no significant findings. Lungs/Pleura: Severe emphysema. Diffuse bilateral bronchial wall thickening. Irregular nodule of the right pulmonary apex measuring 1.0 x 0.5 cm (series 7, image 25). No pleural effusion or pneumothorax. Musculoskeletal: No chest wall abnormality. No acute osseous findings. Review of the MIP images confirms the above findings. CTA ABDOMEN AND PELVIS FINDINGS VASCULAR Normal caliber of the abdominal aorta. Severe, irregular atherosclerosis of the infrarenal abdominal aorta. No evidence of aneurysm, dissection, or other acute aortic pathology. Duplicated bilateral renal arteries with otherwise standard branching pattern of the abdominal aorta. Review of the MIP images confirms the above findings. NON-VASCULAR Hepatobiliary: No solid liver abnormality is seen. Contracted gallbladder. No gallstones, gallbladder wall thickening, or biliary dilatation. Pancreas: Unremarkable. No pancreatic ductal dilatation or surrounding inflammatory changes. Spleen: Normal in size without significant abnormality. Adrenals/Urinary Tract: Adrenal glands are unremarkable. Kidneys are normal, without renal calculi, solid lesion, or hydronephrosis. Bladder is unremarkable. Stomach/Bowel: Stomach is within normal limits. Appendix appears normal. Descending and sigmoid diverticulosis. Severe, chronic wall thickening of the sigmoid colon (series 5, image 179). Lymphatic: No enlarged abdominal or pelvic lymph nodes. Reproductive: No mass or other significant abnormality. Other: No abdominal wall hernia or abnormality. No ascites. Musculoskeletal: No acute osseous findings. IMPRESSION: 1. Normal contour and caliber of the thoracic and abdominal aorta. Severe, irregular atherosclerosis of the infrarenal abdominal aorta. No evidence of aneurysm, dissection, or other acute aortic pathology. 2. Irregular nodule of the right pulmonary apex measuring 1.0 x 0.5 cm. This is  modestly suspicious for primary lung malignancy. By Fleischner Society criteria Consider one of the following in 3 months for both low-risk and high-risk individuals: (a) repeat chest CT, (b) follow-up PET-CT, or (c) tissue sampling. This recommendation follows the consensus statement: Guidelines for Management of Incidental Pulmonary Nodules Detected on CT Images: From the Fleischner Society 2017; Radiology 2017; 284:228-243. 3. Severe emphysema and diffuse bilateral bronchial wall thickening. 4. Descending and sigmoid diverticulosis. Severe, chronic wall thickening of the sigmoid colon, most consistent with chronic sequelae of diverticulitis. 5. Coronary artery disease. Aortic Atherosclerosis (ICD10-I70.0) and Emphysema (ICD10-J43.9). Electronically Signed   By: Fredricka Jenny M.D.   On: 05/28/2023 15:19    Cardiac Studies   Cardiac catheterization, 05/29/2023   Ost LAD to Prox LAD lesion is 70% stenosed.   Mid LAD lesion is 80% stenosed.   1st Mrg lesion is 95% stenosed.   Prox Cx to Mid Cx lesion is 60% stenosed.   Prox RCA to Mid RCA lesion is 90% stenosed.   LV end diastolic pressure is mildly elevated.   3 vessel obstructive CAD Mildly elevated LVEDP  Plan: will resume IV heparin. CT surgery consultation.   Echocardiogram, 05/29/2023 IMPRESSIONS  Left ventricular ejection fraction, by estimation, is 35 to 40% . The left ventricle has moderately decreased function. The left ventricle demonstrates regional wall motion abnormalities ( see scoring diagram/ findings for description) . Left ventricular diastolic parameters are consistent with Grade I diastolic dysfunction ( impaired relaxation) .  Right ventricular systolic function is normal. The right ventricular size is normal. Tricuspid regurgitation signal is inadequate for assessing PA pressure.  The mitral valve is normal in structure. No evidence of mitral valve regurgitation. No evidence of mitral stenosis.  The aortic valve is normal in  structure. Aortic valve regurgitation is not visualized. No aortic stenosis is present.  The inferior vena cava is normal in size with greater than 50% respiratory variability, suggesting right atrial pressure of 3 mmHg.  Patient Profile     TYHEIM METTER is a 58 y.o. male with past medical history of GERD, migraines, IBS, arthritis who presented to [facility] with intermittent chest pain. He was transferred to [facility] to undergo pulmonary consultation for incidental finding of lung nodule. He underwent LHC on 05/29/2023 that showed 3-vessel obstructive CAD and is awaiting CT surgery consult.  Assessment & Plan    Triple vessel obstructive CAD  Ischemic cardiomyopathy  Patient presented with chest pain x 6 months, worse last month occurring at rest last 2 weeks EKG showed subtle STE in aVR, V1 and diffuse ST depressions which are not new Troponin levels 17 > 15 Chest CT showed 1 cm pulmonary nodule in right apex possibly malignant  LHC done 5/7 showed: three vessel obstructive CAD with mildly elevated LVEDP, see report above Echo showed: EF 35-40%, LV RWMA, G1DD, normal IVC Continue ASA 81 mg daily Continue amlodipine 5 mg daily  Starting CoReg 3.125 mg BID  Continue IV heparin  Continue Crestor 20 mg daily  Patient waiting to be seen by CT surgery   Hyperlipidemia  05/28/2023: ALT 10 05/30/2023: HDL 38; LDL Cholesterol 162  Continue statin as above    Hypertension Continue amlodipine and CoReg as above Monitor response and titrate/add on medications as needed    Per primary Lung nodule Severe emphysema  Nicotine abuse  IBS GERD  For questions or updates, please contact [facility] Please consult [email or web address] for contact info under        Signed, Jiles Mote, PA-C  05/30/2023, 8:50 AM    I have personally seen and examined this patient. I agree with the assessment and plan as outlined above.  No chest pain. Cardiac cath with severe three vessel  CAD CT surgery consult for CABG Labs reviewed  My exam: NAD, CV:RRR Lungs: clear bilaterally  Ext: NO LE edema Plan: Continue current medical therapy while awaiting CT surgery consult for CABG  Antoinette Batman, MD, [facility] 05/30/2023 9:04 AM

## 2023-05-30 NOTE — Consult Note (Signed)
 Reason for Consult: Three-vessel coronary disease, right upper lobe lung nodule Referring Physician: Dr. Lawerance Presser Primary  Cardiologist Kelly Patient, MD  [name] is an 58 y.o. male.  HPI: Reginald [name] is a 58 year old man with a history of tobacco abuse, reflux, irritable bowel syndrome, and arthritis.  No prior cardiac history.  He presented on 05/28/2023 with a complaint of chest/epigastric pressure for several weeks.  He would get this both at rest and with exertion.  Sometimes will have radiation to his left neck and left arm.  He had been evaluated by GI including an upper endoscopy which was normal.  He presented to the emergency room and his high-sensitivity troponins were normal.  He had an echocardiogram which showed an ejection fraction of 35 to 40%.  No significant valvular pathology.  Yesterday he underwent cardiac catheterization and was found to have severe three-vessel coronary disease with a mildly elevated LVEDP.  Currently pain-free.  Also noted to have a 1 cm right upper lobe lung nodule.  CT showed advanced emphysematous change of the lungs along with the lung nodule.  There was no mediastinal or hilar adenopathy.  Past Medical History:  Diagnosis Date   Arthritis    GERD (gastroesophageal reflux disease)    Headache    migraines    IBS (irritable bowel syndrome)     Past Surgical History:  Procedure Laterality Date   BIOPSY  11/17/2021   Procedure: BIOPSY;  Surgeon: Urban Garden, MD;  [facility];  Service: Gastroenterology;;   COLONOSCOPY WITH PROPOFOL  N/A 11/17/2021   Procedure: COLONOSCOPY WITH PROPOFOL ;  Surgeon: Urban Garden, MD;  [facility];  Service: Gastroenterology;  Laterality: N/A;  200 ASA 2   ESOPHAGOGASTRODUODENOSCOPY N/A 04/24/2023   Procedure: EGD (ESOPHAGOGASTRODUODENOSCOPY);  Surgeon: Umberto Ganong, Bearl Limes, MD;  [facility];  Service: Gastroenterology;  Laterality: N/A;   9:15AM;ASA 2   ESOPHAGOGASTRODUODENOSCOPY (EGD) WITH PROPOFOL  N/A 11/17/2021   Procedure: ESOPHAGOGASTRODUODENOSCOPY (EGD) WITH PROPOFOL ;  Surgeon: Urban Garden, MD;  [facility];  Service: Gastroenterology;  Laterality: N/A;   INGUINAL HERNIA REPAIR Right 06/02/2015   Procedure: RIGHT INGUINAL HERNIA REPAIR WITH MESH;  Surgeon: Aldean Hummingbird, MD;  Location: [facility] OR;  Service: General;  Laterality: Right;   INSERTION OF MESH Right 06/02/2015   Procedure: INSERTION OF MESH;  Surgeon: Aldean Hummingbird, MD;  Location: [facility] OR;  Service: General;  Laterality: Right;   LEFT HEART CATH AND CORONARY ANGIOGRAPHY N/A 05/29/2023   Procedure: LEFT HEART CATH AND CORONARY ANGIOGRAPHY;  Surgeon: Swaziland, Peter M, MD;  Location: [facility] INVASIVE CV LAB;  Service: Cardiovascular;  Laterality: N/A;   NO PAST SURGERIES     POLYPECTOMY  11/17/2021   Procedure: POLYPECTOMY;  Surgeon: Urban Garden, MD;  [facility];  Service: Gastroenterology;;    Family History  Problem Relation Age of Onset   Diabetes Mother    Osteoarthritis Mother    Heart disease Father     Social History:  reports that he has been smoking cigarettes. He has been exposed to tobacco smoke. He has quit using smokeless tobacco. He reports that he does not drink alcohol and does not use drugs.  Allergies:  Allergies  Allergen Reactions   Penicillins Rash    Immediate rash, facial/tongue/throat swelling, SOB or lightheadedness with hypotension     Medications: Scheduled:  amLODipine  5 mg Oral Daily   aspirin EC  81 mg Oral Daily   carvedilol  3.125 mg Oral BID   nicotine  21 mg Transdermal Daily   pantoprazole   40 mg Oral Daily   rosuvastatin  20 mg Oral Daily   sodium chloride  flush  3 mL Intravenous Q12H   sucralfate   1 g Oral TID WC & HS    Results for orders placed or performed during the hospital encounter of 05/28/23 (from the past 48 hours)  Troponin I (High Sensitivity)     Status: None    Collection Time: 05/28/23  2:18 PM  Result Value Ref Range   Troponin I (High Sensitivity) 17 <18 ng/L    Comment: (NOTE) Elevated high sensitivity troponin I (hsTnI) values and significant  changes across serial measurements may suggest ACS but many other  chronic and acute conditions are known to elevate hsTnI results.  Refer to the "Links" section for chest pain algorithms and additional  guidance. Performed at [facility], [address] [place], [place] [address]   CBC with Differential     Status: None   Collection Time: 05/28/23  2:18 PM  Result Value Ref Range   WBC 8.2 4.0 - 10.5 K/uL   RBC 4.82 4.22 - 5.81 MIL/uL   Hemoglobin 15.3 13.0 - 17.0 g/dL   HCT 21.3 08.6 - 57.8 %   MCV 92.7 80.0 - 100.0 fL   MCH 31.7 26.0 - 34.0 pg   MCHC 34.2 30.0 - 36.0 g/dL   RDW 46.9 62.9 - 52.8 %   Platelets 204 150 - 400 K/uL   nRBC 0.0 0.0 - 0.2 %   Neutrophils Relative % 57 %   Neutro Abs 4.7 1.7 - 7.7 K/uL   Lymphocytes Relative 31 %   Lymphs Abs 2.5 0.7 - 4.0 K/uL   Monocytes Relative 8 %   Monocytes Absolute 0.6 0.1 - 1.0 K/uL   Eosinophils Relative 3 %   Eosinophils Absolute 0.2 0.0 - 0.5 K/uL   Basophils Relative 1 %   Basophils Absolute 0.1 0.0 - 0.1 K/uL   Immature Granulocytes 0 %   Abs Immature Granulocytes 0.02 0.00 - 0.07 K/uL    Comment: Performed at [facility], [address] [place], [place] [address]  Comprehensive metabolic panel     Status: Abnormal   Collection Time: 05/28/23  2:18 PM  Result Value Ref Range   Sodium 136 135 - 145 mmol/L   Potassium 3.8 3.5 - 5.1 mmol/L   Chloride 104 98 - 111 mmol/L   CO2 24 22 - 32 mmol/L   Glucose, Bld 120 (H) 70 - 99 mg/dL    Comment: Glucose reference range applies only to samples taken after fasting for at least 8 hours.   BUN 12 6 - 20 mg/dL   Creatinine, Ser 4.01 0.61 - 1.24 mg/dL   Calcium 8.8 (L) 8.9 - 10.3 mg/dL   Total Protein 6.9 6.5 - 8.1 g/dL   Albumin 4.0 3.5 - 5.0 g/dL   AST 17 15 - 41 U/L   ALT 10 0  - 44 U/L   Alkaline Phosphatase 78 38 - 126 U/L   Total Bilirubin 0.4 0.0 - 1.2 mg/dL   GFR, Estimated >02 >72 mL/min    Comment: (NOTE) Calculated using the CKD-EPI Creatinine Equation (2021)    Anion gap 8 5 - 15    Comment: Performed at [facility], [address] [place], [place] [address]  Lipase, blood     Status: None   Collection Time: 05/28/23  2:18 PM  Result Value  Ref Range   Lipase 30 11 - 51 U/L    Comment: Performed at [facility], [address], [place], [place] [address]  Brain natriuretic peptide     Status: Abnormal   Collection Time: 05/28/23  2:18 PM  Result Value Ref Range   B Natriuretic Peptide 175.0 (H) 0.0 - 100.0 pg/mL    Comment: Performed at [facility], [address], [place], [place] [address]  Protime-INR     Status: None   Collection Time: 05/28/23  2:18 PM  Result Value Ref Range   Prothrombin Time 13.3 11.4 - 15.2 seconds   INR 1.0 0.8 - 1.2    Comment: (NOTE) INR goal varies based on device and disease states. Performed at [facility], [address], [place], [place] [address]   APTT     Status: None   Collection Time: 05/28/23  2:18 PM  Result Value Ref Range   aPTT 31 24 - 36 seconds    Comment: Performed at [facility], [address] [place], [place] [address]  Troponin I (High Sensitivity)     Status: None   Collection Time: 05/28/23  3:48 PM  Result Value Ref Range   Troponin I (High Sensitivity) 15 <18 ng/L    Comment: (NOTE) Elevated high sensitivity troponin I (hsTnI) values and significant  changes across serial measurements may suggest ACS but many other  chronic and acute conditions are known to elevate hsTnI results.  Refer to the "Links" section for chest pain algorithms and additional  guidance. Performed at [facility], [address] [place], [place] [address]   Hemoglobin A1c     Status: None   Collection Time: 05/29/23  3:38 AM  Result Value Ref Range   Hgb A1c MFr Bld 5.4 4.8 - 5.6 %    Comment: (NOTE) Pre  diabetes:          5.7%-6.4%  Diabetes:              >6.4%  Glycemic control for   <7.0% adults with diabetes    Mean Plasma Glucose 108.28 mg/dL    Comment: Performed at [facility], [address] [place], [place] [address]  Heparin level (unfractionated)     Status: Abnormal   Collection Time: 05/29/23  3:39 AM  Result Value Ref Range   Heparin Unfractionated 0.16 (L) 0.30 - 0.70 IU/mL    Comment: (NOTE) The clinical reportable range upper limit is being lowered to >1.10 to align with the FDA approved guidance for the current laboratory assay.  If heparin results are below expected values, and patient dosage has  been confirmed, suggest follow up testing of antithrombin III levels. Performed at [facility], [address] [place], [place] [address]   CBC     Status: None   Collection Time: 05/29/23  3:39 AM  Result Value Ref Range   WBC 9.2 4.0 - 10.5 K/uL   RBC 5.05 4.22 - 5.81 MIL/uL   Hemoglobin 15.4 13.0 - 17.0 g/dL   HCT 57.8 46.9 - 62.9 %   MCV 90.7 80.0 - 100.0 fL   MCH 30.5 26.0 - 34.0 pg   MCHC 33.6 30.0 - 36.0 g/dL   RDW 52.8 41.3 - 24.4 %   Platelets 202 150 - 400 K/uL   nRBC 0.0 0.0 - 0.2 %    Comment: Performed at [facility], [address] [place], [place] [address]  HIV Antibody (routine testing w  rflx)     Status: None   Collection Time: 05/29/23  3:39 AM  Result Value Ref Range   HIV Screen 4th Generation wRfx Non Reactive Non Reactive    Comment: Performed at [facility], [address] [place], [place] [address]  Heparin level (unfractionated)     Status: None   Collection Time: 05/29/23  1:45 PM  Result Value Ref Range   Heparin Unfractionated 0.30 0.30 - 0.70 IU/mL    Comment: (NOTE) The clinical reportable range upper limit is being lowered to >1.10 to align with the FDA approved guidance for the current laboratory assay.  If heparin results are below expected values, and patient dosage has  been confirmed,  suggest follow up testing of antithrombin III levels. Performed at [facility], [address], [place], [place] [address]   CBC     Status: None   Collection Time: 05/30/23  4:45 AM  Result Value Ref Range   WBC 8.9 4.0 - 10.5 K/uL   RBC 5.46 4.22 - 5.81 MIL/uL   Hemoglobin 16.7 13.0 - 17.0 g/dL   HCT 19.1 47.8 - 29.5 %   MCV 91.4 80.0 - 100.0 fL   MCH 30.6 26.0 - 34.0 pg   MCHC 33.5 30.0 - 36.0 g/dL   RDW 62.1 30.8 - 65.7 %   Platelets 222 150 - 400 K/uL   nRBC 0.0 0.0 - 0.2 %    Comment: Performed at [facility], [address] [place], [place] [address]  Lipid panel     Status: Abnormal   Collection Time: 05/30/23  4:45 AM  Result Value Ref Range   Cholesterol 223 (H) 0 - 200 mg/dL   Triglycerides 295 <284 mg/dL   HDL 38 (L) >13 mg/dL   Total CHOL/HDL Ratio 5.9 RATIO   VLDL 23 0 - 40 mg/dL   LDL Cholesterol 244 (H) 0 - 99 mg/dL    Comment:        Total Cholesterol/HDL:CHD Risk Coronary Heart Disease Risk Table                     Men   Women  1/2 Average Risk   3.4   3.3  Average Risk       5.0   4.4  2 X Average Risk   9.6   7.1  3 X Average Risk  23.4   11.0        Use the calculated Patient Ratio above and the CHD Risk Table to determine the patient's CHD Risk.        ATP III CLASSIFICATION (LDL):  <100     mg/dL   Optimal  010-272  mg/dL   Near or Above                    Optimal  130-159  mg/dL   Borderline  536-644  mg/dL   High  >034     mg/dL   Very High Performed at [facility], [address] [place], [place] [address]   Basic metabolic panel with GFR     Status: Abnormal   Collection Time: 05/30/23  4:45 AM  Result Value Ref Range   Sodium 139 135 - 145 mmol/L   Potassium 4.4 3.5 - 5.1 mmol/L   Chloride 105 98 - 111 mmol/L   CO2 26 22 - 32 mmol/L   Glucose, Bld 106 (H) 70 - 99 mg/dL  Comment: Glucose reference range applies only to samples taken after fasting for at least 8 hours.   BUN 13 6 - 20 mg/dL   Creatinine,  Ser 4.09 0.61 - 1.24 mg/dL   Calcium 9.3 8.9 - 81.1 mg/dL   GFR, Estimated >91 >47 mL/min    Comment: (NOTE) Calculated using the CKD-EPI Creatinine Equation (2021)    Anion gap 8 5 - 15    Comment: Performed at [facility], [address], [place], [place] [address]  Heparin level (unfractionated)     Status: Abnormal   Collection Time: 05/30/23  4:45 AM  Result Value Ref Range   Heparin Unfractionated 0.21 (L) 0.30 - 0.70 IU/mL    Comment: (NOTE) The clinical reportable range upper limit is being lowered to >1.10 to align with the FDA approved guidance for the current laboratory assay.  If heparin results are below expected values, and patient dosage has  been confirmed, suggest follow up testing of antithrombin III levels. Performed at [facility], [address], [place], [place] [address]     ECHOCARDIOGRAM COMPLETE Result Date: 05/29/2023    ECHOCARDIOGRAM REPORT   Patient Name:   Reginald [name] Date of Exam: 05/29/2023 Medical Rec #:  [id number]    Height:       68.0 in Accession #:    [id number]   Weight:       128.1 lb Date of Birth:  07-02-1965    BSA:          1.691 m Patient Age:    57 years     BP:           155/90 mmHg Patient Gender: M            HR:           70 bpm. Exam Location:  Inpatient Procedure: 2D Echo, Cardiac Doppler, Color Doppler and Intracardiac            Opacification Agent (Both Spectral and Color Flow Doppler were            utilized during procedure). Indications:    R07.9* Chest pain, unspecified  History:        Patient has no prior history of Echocardiogram examinations.                 Signs/Symptoms:Chest Pain; Risk Factors:Hypertension.  Sonographer:    Raynelle Callow RDCS Referring Phys: [id number] VISHNU P MALLIPEDDI  Sonographer Comments: Suboptimal parasternal window. IMPRESSIONS  1. Left ventricular ejection fraction, by estimation, is 35 to 40%. The left ventricle has moderately decreased function. The left ventricle demonstrates regional wall  motion abnormalities (see scoring diagram/findings for description). Left ventricular  diastolic parameters are consistent with Grade I diastolic dysfunction (impaired relaxation).  2. Right ventricular systolic function is normal. The right ventricular size is normal. Tricuspid regurgitation signal is inadequate for assessing PA pressure.  3. The mitral valve is normal in structure. No evidence of mitral valve regurgitation. No evidence of mitral stenosis.  4. The aortic valve is normal in structure. Aortic valve regurgitation is not visualized. No aortic stenosis is present.  5. The inferior vena cava is normal in size with greater than 50% respiratory variability, suggesting right atrial pressure of 3 mmHg. FINDINGS  Left Ventricle: Left ventricular ejection fraction, by estimation, is 35 to 40%. The left ventricle has moderately decreased function. The left ventricle demonstrates regional wall motion abnormalities. Definity contrast agent was given IV to delineate the  left ventricular endocardial borders. The left ventricular internal cavity size was normal in size. There is no left ventricular hypertrophy. Left ventricular diastolic parameters are consistent with Grade I diastolic dysfunction (impaired relaxation).  LV Wall Scoring: The mid and distal lateral wall, posterior wall, and mid anterolateral segment are akinetic. Right Ventricle: The right ventricular size is normal. No increase in right ventricular wall thickness. Right ventricular systolic function is normal. Tricuspid regurgitation signal is inadequate for assessing PA pressure. Left Atrium: Left atrial size was normal in size. Right Atrium: Right atrial size was normal in size. Pericardium: There is no evidence of pericardial effusion. Mitral Valve: The mitral valve is normal in structure. No evidence of mitral valve regurgitation. No evidence of mitral valve stenosis. Tricuspid Valve: The tricuspid valve is normal in structure. Tricuspid valve  regurgitation is not demonstrated. No evidence of tricuspid stenosis. Aortic Valve: The aortic valve is normal in structure. Aortic valve regurgitation is not visualized. No aortic stenosis is present. Pulmonic Valve: The pulmonic valve was normal in structure. Pulmonic valve regurgitation is not visualized. No evidence of pulmonic stenosis. Aorta: The aortic root is normal in size and structure. Venous: The inferior vena cava is normal in size with greater than 50% respiratory variability, suggesting right atrial pressure of 3 mmHg. IAS/Shunts: No atrial level shunt detected by color flow Doppler.  LEFT VENTRICLE PLAX 2D LVIDd:         4.50 cm      Diastology LVIDs:         3.60 cm      LV e' medial:    6.09 cm/s LV PW:         1.20 cm      LV E/e' medial:  8.6 LV IVS:        0.90 cm      LV e' lateral:   6.09 cm/s LVOT diam:     2.00 cm      LV E/e' lateral: 8.6 LV SV:         52 LV SV Index:   31 LVOT Area:     3.14 cm  LV Volumes (MOD) LV vol d, MOD A2C: 113.0 ml LV vol d, MOD A4C: 112.0 ml LV vol s, MOD A2C: 71.3 ml LV vol s, MOD A4C: 70.0 ml LV SV MOD A2C:     41.7 ml LV SV MOD A4C:     112.0 ml LV SV MOD BP:      39.6 ml RIGHT VENTRICLE TAPSE (M-mode): 1.5 cm LEFT ATRIUM             Index        RIGHT ATRIUM           Index LA diam:        1.80 cm 1.06 cm/m   RA Area:     11.00 cm LA Vol (A2C):   30.1 ml 17.80 ml/m  RA Volume:   22.90 ml  13.54 ml/m LA Vol (A4C):   15.8 ml 9.34 ml/m LA Biplane Vol: 22.5 ml 13.30 ml/m  AORTIC VALVE LVOT Vmax:   98.50 cm/s LVOT Vmean:  60.300 cm/s LVOT VTI:    0.167 m  AORTA Ao Root diam: 2.80 cm Ao Asc diam:  2.20 cm MITRAL VALVE MV Area (PHT): 3.13 cm    SHUNTS MV Decel Time: 243 msec    Systemic VTI:  0.17 m MV E velocity: 52.40 cm/s  Systemic Diam: 2.00 cm MV A velocity: 60.55 cm/s MV  E/A ratio:  0.87 Dorothye Gathers MD Electronically signed by Dorothye Gathers MD Signature Date/Time: 05/29/2023/4:39:45 PM    Final    CARDIAC CATHETERIZATION Result Date: 05/29/2023   Ost LAD  to Prox LAD lesion is 70% stenosed.   Mid LAD lesion is 80% stenosed.   1st Mrg lesion is 95% stenosed.   Prox Cx to Mid Cx lesion is 60% stenosed.   Prox RCA to Mid RCA lesion is 90% stenosed.   LV end diastolic pressure is mildly elevated. 3 vessel obstructive CAD Mildly elevated LVEDP Plan: will resume IV heparin. CT surgery consultation.   CT ANGIO CHEST/ABD/PEL FOR DISSECTION W &/OR WO CONTRAST Result Date: 05/28/2023 CLINICAL DATA:  Persistent chest pain radiating to arm * Tracking Code: BO * EXAM: CT ANGIOGRAPHY CHEST, ABDOMEN AND PELVIS TECHNIQUE: Non-contrast CT of the chest was initially obtained. Multidetector CT imaging through the chest, abdomen and pelvis was performed using the standard protocol during bolus administration of intravenous contrast. Multiplanar reconstructed images and MIPs were obtained and reviewed to evaluate the vascular anatomy. RADIATION DOSE REDUCTION: This exam was performed according to the departmental dose-optimization program which includes automated exposure control, adjustment of the mA and/or kV according to patient size and/or use of iterative reconstruction technique. CONTRAST:  OMNIPAQUE  IOHEXOL  350 MG/ML SOLN COMPARISON:  None Available. FINDINGS: CTA CHEST FINDINGS VASCULAR Aorta: Satisfactory opacification of the aorta. Normal contour and caliber of the thoracic aorta. No evidence of aneurysm, dissection, or other acute aortic pathology. Mild mixed calcific atherosclerosis of the thoracic aorta. Cardiovascular: No evidence of pulmonary embolism on limited non-tailored examination. Normal heart size. Left coronary artery calcifications. No pericardial effusion. Review of the MIP images confirms the above findings. NON VASCULAR Mediastinum/Nodes: No enlarged mediastinal, hilar, or axillary lymph nodes. Thyroid gland, trachea, and esophagus demonstrate no significant findings. Lungs/Pleura: Severe emphysema. Diffuse bilateral bronchial wall thickening.  Irregular nodule of the right pulmonary apex measuring 1.0 x 0.5 cm (series 7, image 25). No pleural effusion or pneumothorax. Musculoskeletal: No chest wall abnormality. No acute osseous findings. Review of the MIP images confirms the above findings. CTA ABDOMEN AND PELVIS FINDINGS VASCULAR Normal caliber of the abdominal aorta. Severe, irregular atherosclerosis of the infrarenal abdominal aorta. No evidence of aneurysm, dissection, or other acute aortic pathology. Duplicated bilateral renal arteries with otherwise standard branching pattern of the abdominal aorta. Review of the MIP images confirms the above findings. NON-VASCULAR Hepatobiliary: No solid liver abnormality is seen. Contracted gallbladder. No gallstones, gallbladder wall thickening, or biliary dilatation. Pancreas: Unremarkable. No pancreatic ductal dilatation or surrounding inflammatory changes. Spleen: Normal in size without significant abnormality. Adrenals/Urinary Tract: Adrenal glands are unremarkable. Kidneys are normal, without renal calculi, solid lesion, or hydronephrosis. Bladder is unremarkable. Stomach/Bowel: Stomach is within normal limits. Appendix appears normal. Descending and sigmoid diverticulosis. Severe, chronic wall thickening of the sigmoid colon (series 5, image 179). Lymphatic: No enlarged abdominal or pelvic lymph nodes. Reproductive: No mass or other significant abnormality. Other: No abdominal wall hernia or abnormality. No ascites. Musculoskeletal: No acute osseous findings. IMPRESSION: 1. Normal contour and caliber of the thoracic and abdominal aorta. Severe, irregular atherosclerosis of the infrarenal abdominal aorta. No evidence of aneurysm, dissection, or other acute aortic pathology. 2. Irregular nodule of the right pulmonary apex measuring 1.0 x 0.5 cm. This is modestly suspicious for primary lung malignancy. By Fleischner Society criteria Consider one of the following in 3 months for both low-risk and high-risk  individuals: (a) repeat chest CT, (b) follow-up PET-CT, or (  c) tissue sampling. This recommendation follows the consensus statement: Guidelines for Management of Incidental Pulmonary Nodules Detected on CT Images: From the Fleischner Society 2017; Radiology 2017; 284:228-243. 3. Severe emphysema and diffuse bilateral bronchial wall thickening. 4. Descending and sigmoid diverticulosis. Severe, chronic wall thickening of the sigmoid colon, most consistent with chronic sequelae of diverticulitis. 5. Coronary artery disease. Aortic Atherosclerosis (ICD10-I70.0) and Emphysema (ICD10-J43.9). Electronically Signed   By: Fredricka Jenny M.D.   On: 05/28/2023 15:19   I personally reviewed his cardiac catheterization images and his CT chest images.  There is severe three-vessel coronary disease by catheterization.  CT shows a 5 x 10 mm spiculated right upper lobe nodule.  No mediastinal or hilar adenopathy.  There are emphysematous changes of the lungs. Review of Systems  Constitutional:  Positive for fatigue. Negative for activity change.  HENT:  Negative for trouble swallowing and voice change.   Respiratory:  Positive for cough. Negative for shortness of breath and wheezing.   Cardiovascular:  Positive for chest pain. Negative for leg swelling.  Gastrointestinal:  Positive for abdominal pain.   Blood pressure (!) 138/91, pulse 76, temperature 97.6 F (36.4 C), temperature source Oral, resp. rate 19, height 5\' 8"  (1.727 m), weight 58.1 kg, SpO2 97%. Physical Exam Vitals reviewed.  Constitutional:      General: He is not in acute distress.    Appearance: He is well-developed.  HENT:     Head: Normocephalic and atraumatic.  Eyes:     General: No scleral icterus.    Extraocular Movements: Extraocular movements intact.  Neck:     Vascular: No carotid bruit.  Cardiovascular:     Rate and Rhythm: Normal rate and regular rhythm.     Heart sounds: Normal heart sounds. No murmur heard.    No friction rub. No  gallop.     Comments: Allen's test left arm slight delay with a refill Pulmonary:     Effort: Pulmonary effort is normal. No respiratory distress.     Breath sounds: Normal breath sounds. No wheezing or rales.  Abdominal:     General: There is no distension.     Palpations: Abdomen is soft.  Musculoskeletal:     Cervical back: Neck supple.     Right lower leg: No edema.     Left lower leg: No edema.  Lymphadenopathy:     Cervical: No cervical adenopathy.  Skin:    General: Skin is warm and dry.  Neurological:     General: No focal deficit present.     Mental Status: He is alert and oriented to person, place, and time.     Cranial Nerves: No cranial nerve deficit.     Motor: No weakness.    Assessment/Plan: Reginald [name] is a 58 year old man with a history of tobacco abuse, reflux, irritable bowel syndrome, and arthritis.  No prior cardiac history.  He presented with intermittent chest discomfort for over a month.  GI workup negative.  Cardiac workup revealed severe three-vessel disease with moderate left ventricular dysfunction.  Coronary bypass grafting is indicated for survival benefit and relief of symptoms.  I discussed the proposed operative procedure with Mr. and Mrs. [name].  I informed him of the general nature of the procedure including the need for general anesthesia, the incisions to be used, the possible use of the left radial artery, the use of cardiopulmonary bypass, the use of temporary pacemaker wires and drainage tubes postoperatively, the expected hospital stay, and the overall recovery.  I  informed them of the indications, risks, benefits, and alternatives.  They understand the risks include, but are not limited to death, MI, DVT, PE, bleeding, possible need for transfusion, infection, cardiac arrhythmias, respiratory or renal failure, as well as possibility of other unforeseeable complications.  The Allen's test on his left arm is normal but with slight delay.  Will see  what the vascular labs assessment of his palmar arch is.  May have to make a final decision regarding possible left radial use in the operating room.  He accepts the risk and agrees to proceed.  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  (T1, N0) will need additional workup including PET/CT as an outpatient.  Would not attempt to deal with that at the time of his cardiac surgery given the emphysema and high risk for an air leak.  Emphysema-has extensive emphysema on CT.  Clinically has not had issues and has a job where he is very active.  Tobacco abuse-emphasized the importance of complete cessation of all tobacco products but especially smoking tobacco.  Plan CABG, possible left radial artery harvest tomorrow 05/31/2023  [name] 05/30/2023, 1:30 PM

## 2023-05-30 NOTE — H&P (View-Only) (Signed)
 Reason for Consult: Three-vessel coronary disease, right upper lobe lung nodule Referring Physician: Dr. Lawerance Presser Primary  Cardiologist Kelly Patient, MD  Reginald [name] is an 58 y.o. male.  HPI: Reginald [name] is a 58 year old man with a history of tobacco abuse, reflux, irritable bowel syndrome, and arthritis.  No prior cardiac history.  He presented on 05/28/2023 with a complaint of chest/epigastric pressure for several weeks.  He would get this both at rest and with exertion.  Sometimes will have radiation to his left neck and left arm.  He had been evaluated by GI including an upper endoscopy which was normal.  He presented to the emergency room and his high-sensitivity troponins were normal.  He had an echocardiogram which showed an ejection fraction of 35 to 40%.  No significant valvular pathology.  Yesterday he underwent cardiac catheterization and was found to have severe three-vessel coronary disease with a mildly elevated LVEDP.  Currently pain-free.  Also noted to have a 1 cm right upper lobe lung nodule.  CT showed advanced emphysematous change of the lungs along with the lung nodule.  There was no mediastinal or hilar adenopathy.  Past Medical History:  Diagnosis Date   Arthritis    GERD (gastroesophageal reflux disease)    Headache    migraines    IBS (irritable bowel syndrome)     Past Surgical History:  Procedure Laterality Date   BIOPSY  11/17/2021   Procedure: BIOPSY;  Surgeon: Urban Garden, MD;  [facility];  Service: Gastroenterology;;   COLONOSCOPY WITH PROPOFOL  N/A 11/17/2021   Procedure: COLONOSCOPY WITH PROPOFOL ;  Surgeon: Urban Garden, MD;  [facility];  Service: Gastroenterology;  Laterality: N/A;  200 ASA 2   ESOPHAGOGASTRODUODENOSCOPY N/A 04/24/2023   Procedure: EGD (ESOPHAGOGASTRODUODENOSCOPY);  Surgeon: Umberto Ganong, Bearl Limes, MD;  [facility];  Service: Gastroenterology;  Laterality: N/A;   9:15AM;ASA 2   ESOPHAGOGASTRODUODENOSCOPY (EGD) WITH PROPOFOL  N/A 11/17/2021   Procedure: ESOPHAGOGASTRODUODENOSCOPY (EGD) WITH PROPOFOL ;  Surgeon: Urban Garden, MD;  [facility];  Service: Gastroenterology;  Laterality: N/A;   INGUINAL HERNIA REPAIR Right 06/02/2015   Procedure: RIGHT INGUINAL HERNIA REPAIR WITH MESH;  Surgeon: Aldean Hummingbird, MD;  Location: [facility] OR;  Service: General;  Laterality: Right;   INSERTION OF MESH Right 06/02/2015   Procedure: INSERTION OF MESH;  Surgeon: Aldean Hummingbird, MD;  Location: [facility] OR;  Service: General;  Laterality: Right;   LEFT HEART CATH AND CORONARY ANGIOGRAPHY N/A 05/29/2023   Procedure: LEFT HEART CATH AND CORONARY ANGIOGRAPHY;  Surgeon: Swaziland, Peter M, MD;  Location: [facility] INVASIVE CV LAB;  Service: Cardiovascular;  Laterality: N/A;   NO PAST SURGERIES     POLYPECTOMY  11/17/2021   Procedure: POLYPECTOMY;  Surgeon: Urban Garden, MD;  [facility];  Service: Gastroenterology;;    Family History  Problem Relation Age of Onset   Diabetes Mother    Osteoarthritis Mother    Heart disease Father     Social History:  reports that he has been smoking cigarettes. He has been exposed to tobacco smoke. He has quit using smokeless tobacco. He reports that he does not drink alcohol and does not use drugs.  Allergies:  Allergies  Allergen Reactions   Penicillins Rash    Immediate rash, facial/tongue/throat swelling, SOB or lightheadedness with hypotension     Medications: Scheduled:  amLODipine  5 mg Oral Daily   aspirin EC  81 mg Oral Daily   carvedilol  3.125 mg Oral BID   nicotine  21 mg Transdermal Daily   pantoprazole   40 mg Oral Daily   rosuvastatin  20 mg Oral Daily   sodium chloride  flush  3 mL Intravenous Q12H   sucralfate   1 g Oral TID WC & HS    Results for orders placed or performed during the hospital encounter of 05/28/23 (from the past 48 hours)  Troponin I (High Sensitivity)     Status: None    Collection Time: 05/28/23  2:18 PM  Result Value Ref Range   Troponin I (High Sensitivity) 17 <18 ng/L    Comment: (NOTE) Elevated high sensitivity troponin I (hsTnI) values and significant  changes across serial measurements may suggest ACS but many other  chronic and acute conditions are known to elevate hsTnI results.  Refer to the "Links" section for chest pain algorithms and additional  guidance. Performed at [facility], [address] [place], [place] [address]   CBC with Differential     Status: None   Collection Time: 05/28/23  2:18 PM  Result Value Ref Range   WBC 8.2 4.0 - 10.5 K/uL   RBC 4.82 4.22 - 5.81 MIL/uL   Hemoglobin 15.3 13.0 - 17.0 g/dL   HCT 21.3 08.6 - 57.8 %   MCV 92.7 80.0 - 100.0 fL   MCH 31.7 26.0 - 34.0 pg   MCHC 34.2 30.0 - 36.0 g/dL   RDW 46.9 62.9 - 52.8 %   Platelets 204 150 - 400 K/uL   nRBC 0.0 0.0 - 0.2 %   Neutrophils Relative % 57 %   Neutro Abs 4.7 1.7 - 7.7 K/uL   Lymphocytes Relative 31 %   Lymphs Abs 2.5 0.7 - 4.0 K/uL   Monocytes Relative 8 %   Monocytes Absolute 0.6 0.1 - 1.0 K/uL   Eosinophils Relative 3 %   Eosinophils Absolute 0.2 0.0 - 0.5 K/uL   Basophils Relative 1 %   Basophils Absolute 0.1 0.0 - 0.1 K/uL   Immature Granulocytes 0 %   Abs Immature Granulocytes 0.02 0.00 - 0.07 K/uL    Comment: Performed at [facility], [address] [place], [place] [address]  Comprehensive metabolic panel     Status: Abnormal   Collection Time: 05/28/23  2:18 PM  Result Value Ref Range   Sodium 136 135 - 145 mmol/L   Potassium 3.8 3.5 - 5.1 mmol/L   Chloride 104 98 - 111 mmol/L   CO2 24 22 - 32 mmol/L   Glucose, Bld 120 (H) 70 - 99 mg/dL    Comment: Glucose reference range applies only to samples taken after fasting for at least 8 hours.   BUN 12 6 - 20 mg/dL   Creatinine, Ser 4.01 0.61 - 1.24 mg/dL   Calcium 8.8 (L) 8.9 - 10.3 mg/dL   Total Protein 6.9 6.5 - 8.1 g/dL   Albumin 4.0 3.5 - 5.0 g/dL   AST 17 15 - 41 U/L   ALT 10 0  - 44 U/L   Alkaline Phosphatase 78 38 - 126 U/L   Total Bilirubin 0.4 0.0 - 1.2 mg/dL   GFR, Estimated >02 >72 mL/min    Comment: (NOTE) Calculated using the CKD-EPI Creatinine Equation (2021)    Anion gap 8 5 - 15    Comment: Performed at [facility], [address] [place], [place] [address]  Lipase, blood     Status: None   Collection Time: 05/28/23  2:18 PM  Result Value  Ref Range   Lipase 30 11 - 51 U/L    Comment: Performed at [facility], [address], [place], [place] [address]  Brain natriuretic peptide     Status: Abnormal   Collection Time: 05/28/23  2:18 PM  Result Value Ref Range   B Natriuretic Peptide 175.0 (H) 0.0 - 100.0 pg/mL    Comment: Performed at [facility], [address], [place], [place] [address]  Protime-INR     Status: None   Collection Time: 05/28/23  2:18 PM  Result Value Ref Range   Prothrombin Time 13.3 11.4 - 15.2 seconds   INR 1.0 0.8 - 1.2    Comment: (NOTE) INR goal varies based on device and disease states. Performed at [facility], [address], [place], [place] [address]   APTT     Status: None   Collection Time: 05/28/23  2:18 PM  Result Value Ref Range   aPTT 31 24 - 36 seconds    Comment: Performed at [facility], [address] [place], [place] [address]  Troponin I (High Sensitivity)     Status: None   Collection Time: 05/28/23  3:48 PM  Result Value Ref Range   Troponin I (High Sensitivity) 15 <18 ng/L    Comment: (NOTE) Elevated high sensitivity troponin I (hsTnI) values and significant  changes across serial measurements may suggest ACS but many other  chronic and acute conditions are known to elevate hsTnI results.  Refer to the "Links" section for chest pain algorithms and additional  guidance. Performed at [facility], [address] [place], [place] [address]   Hemoglobin A1c     Status: None   Collection Time: 05/29/23  3:38 AM  Result Value Ref Range   Hgb A1c MFr Bld 5.4 4.8 - 5.6 %    Comment: (NOTE) Pre  diabetes:          5.7%-6.4%  Diabetes:              >6.4%  Glycemic control for   <7.0% adults with diabetes    Mean Plasma Glucose 108.28 mg/dL    Comment: Performed at [facility], [address] [place], [place] [address]  Heparin level (unfractionated)     Status: Abnormal   Collection Time: 05/29/23  3:39 AM  Result Value Ref Range   Heparin Unfractionated 0.16 (L) 0.30 - 0.70 IU/mL    Comment: (NOTE) The clinical reportable range upper limit is being lowered to >1.10 to align with the FDA approved guidance for the current laboratory assay.  If heparin results are below expected values, and patient dosage has  been confirmed, suggest follow up testing of antithrombin III levels. Performed at [facility], [address] [place], [place] [address]   CBC     Status: None   Collection Time: 05/29/23  3:39 AM  Result Value Ref Range   WBC 9.2 4.0 - 10.5 K/uL   RBC 5.05 4.22 - 5.81 MIL/uL   Hemoglobin 15.4 13.0 - 17.0 g/dL   HCT 57.8 46.9 - 62.9 %   MCV 90.7 80.0 - 100.0 fL   MCH 30.5 26.0 - 34.0 pg   MCHC 33.6 30.0 - 36.0 g/dL   RDW 52.8 41.3 - 24.4 %   Platelets 202 150 - 400 K/uL   nRBC 0.0 0.0 - 0.2 %    Comment: Performed at [facility], [address] [place], [place] [address]  HIV Antibody (routine testing w  rflx)     Status: None   Collection Time: 05/29/23  3:39 AM  Result Value Ref Range   HIV Screen 4th Generation wRfx Non Reactive Non Reactive    Comment: Performed at [facility], [address] [place], [place] [address]  Heparin level (unfractionated)     Status: None   Collection Time: 05/29/23  1:45 PM  Result Value Ref Range   Heparin Unfractionated 0.30 0.30 - 0.70 IU/mL    Comment: (NOTE) The clinical reportable range upper limit is being lowered to >1.10 to align with the FDA approved guidance for the current laboratory assay.  If heparin results are below expected values, and patient dosage has  been confirmed,  suggest follow up testing of antithrombin III levels. Performed at [facility], [address], [place], [place] [address]   CBC     Status: None   Collection Time: 05/30/23  4:45 AM  Result Value Ref Range   WBC 8.9 4.0 - 10.5 K/uL   RBC 5.46 4.22 - 5.81 MIL/uL   Hemoglobin 16.7 13.0 - 17.0 g/dL   HCT 19.1 47.8 - 29.5 %   MCV 91.4 80.0 - 100.0 fL   MCH 30.6 26.0 - 34.0 pg   MCHC 33.5 30.0 - 36.0 g/dL   RDW 62.1 30.8 - 65.7 %   Platelets 222 150 - 400 K/uL   nRBC 0.0 0.0 - 0.2 %    Comment: Performed at [facility], [address] [place], [place] [address]  Lipid panel     Status: Abnormal   Collection Time: 05/30/23  4:45 AM  Result Value Ref Range   Cholesterol 223 (H) 0 - 200 mg/dL   Triglycerides 295 <284 mg/dL   HDL 38 (L) >13 mg/dL   Total CHOL/HDL Ratio 5.9 RATIO   VLDL 23 0 - 40 mg/dL   LDL Cholesterol 244 (H) 0 - 99 mg/dL    Comment:        Total Cholesterol/HDL:CHD Risk Coronary Heart Disease Risk Table                     Men   Women  1/2 Average Risk   3.4   3.3  Average Risk       5.0   4.4  2 X Average Risk   9.6   7.1  3 X Average Risk  23.4   11.0        Use the calculated Patient Ratio above and the CHD Risk Table to determine the patient's CHD Risk.        ATP III CLASSIFICATION (LDL):  <100     mg/dL   Optimal  010-272  mg/dL   Near or Above                    Optimal  130-159  mg/dL   Borderline  536-644  mg/dL   High  >034     mg/dL   Very High Performed at [facility], [address] [place], [place] [address]   Basic metabolic panel with GFR     Status: Abnormal   Collection Time: 05/30/23  4:45 AM  Result Value Ref Range   Sodium 139 135 - 145 mmol/L   Potassium 4.4 3.5 - 5.1 mmol/L   Chloride 105 98 - 111 mmol/L   CO2 26 22 - 32 mmol/L   Glucose, Bld 106 (H) 70 - 99 mg/dL  Comment: Glucose reference range applies only to samples taken after fasting for at least 8 hours.   BUN 13 6 - 20 mg/dL   Creatinine,  Ser 4.09 0.61 - 1.24 mg/dL   Calcium 9.3 8.9 - 81.1 mg/dL   GFR, Estimated >91 >47 mL/min    Comment: (NOTE) Calculated using the CKD-EPI Creatinine Equation (2021)    Anion gap 8 5 - 15    Comment: Performed at [facility], [address], [place], [place] [address]  Heparin level (unfractionated)     Status: Abnormal   Collection Time: 05/30/23  4:45 AM  Result Value Ref Range   Heparin Unfractionated 0.21 (L) 0.30 - 0.70 IU/mL    Comment: (NOTE) The clinical reportable range upper limit is being lowered to >1.10 to align with the FDA approved guidance for the current laboratory assay.  If heparin results are below expected values, and patient dosage has  been confirmed, suggest follow up testing of antithrombin III levels. Performed at [facility], [address], [place], [place] [address]     ECHOCARDIOGRAM COMPLETE Result Date: 05/29/2023    ECHOCARDIOGRAM REPORT   Patient Name:   Reginald [name] Date of Exam: 05/29/2023 Medical Rec #:  [id number]    Height:       68.0 in Accession #:    [id number]   Weight:       128.1 lb Date of Birth:  07-02-1965    BSA:          1.691 m Patient Age:    57 years     BP:           155/90 mmHg Patient Gender: M            HR:           70 bpm. Exam Location:  Inpatient Procedure: 2D Echo, Cardiac Doppler, Color Doppler and Intracardiac            Opacification Agent (Both Spectral and Color Flow Doppler were            utilized during procedure). Indications:    R07.9* Chest pain, unspecified  History:        Patient has no prior history of Echocardiogram examinations.                 Signs/Symptoms:Chest Pain; Risk Factors:Hypertension.  Sonographer:    Raynelle Callow RDCS Referring Phys: [id number] VISHNU P MALLIPEDDI  Sonographer Comments: Suboptimal parasternal window. IMPRESSIONS  1. Left ventricular ejection fraction, by estimation, is 35 to 40%. The left ventricle has moderately decreased function. The left ventricle demonstrates regional wall  motion abnormalities (see scoring diagram/findings for description). Left ventricular  diastolic parameters are consistent with Grade I diastolic dysfunction (impaired relaxation).  2. Right ventricular systolic function is normal. The right ventricular size is normal. Tricuspid regurgitation signal is inadequate for assessing PA pressure.  3. The mitral valve is normal in structure. No evidence of mitral valve regurgitation. No evidence of mitral stenosis.  4. The aortic valve is normal in structure. Aortic valve regurgitation is not visualized. No aortic stenosis is present.  5. The inferior vena cava is normal in size with greater than 50% respiratory variability, suggesting right atrial pressure of 3 mmHg. FINDINGS  Left Ventricle: Left ventricular ejection fraction, by estimation, is 35 to 40%. The left ventricle has moderately decreased function. The left ventricle demonstrates regional wall motion abnormalities. Definity contrast agent was given IV to delineate the  left ventricular endocardial borders. The left ventricular internal cavity size was normal in size. There is no left ventricular hypertrophy. Left ventricular diastolic parameters are consistent with Grade I diastolic dysfunction (impaired relaxation).  LV Wall Scoring: The mid and distal lateral wall, posterior wall, and mid anterolateral segment are akinetic. Right Ventricle: The right ventricular size is normal. No increase in right ventricular wall thickness. Right ventricular systolic function is normal. Tricuspid regurgitation signal is inadequate for assessing PA pressure. Left Atrium: Left atrial size was normal in size. Right Atrium: Right atrial size was normal in size. Pericardium: There is no evidence of pericardial effusion. Mitral Valve: The mitral valve is normal in structure. No evidence of mitral valve regurgitation. No evidence of mitral valve stenosis. Tricuspid Valve: The tricuspid valve is normal in structure. Tricuspid valve  regurgitation is not demonstrated. No evidence of tricuspid stenosis. Aortic Valve: The aortic valve is normal in structure. Aortic valve regurgitation is not visualized. No aortic stenosis is present. Pulmonic Valve: The pulmonic valve was normal in structure. Pulmonic valve regurgitation is not visualized. No evidence of pulmonic stenosis. Aorta: The aortic root is normal in size and structure. Venous: The inferior vena cava is normal in size with greater than 50% respiratory variability, suggesting right atrial pressure of 3 mmHg. IAS/Shunts: No atrial level shunt detected by color flow Doppler.  LEFT VENTRICLE PLAX 2D LVIDd:         4.50 cm      Diastology LVIDs:         3.60 cm      LV e' medial:    6.09 cm/s LV PW:         1.20 cm      LV E/e' medial:  8.6 LV IVS:        0.90 cm      LV e' lateral:   6.09 cm/s LVOT diam:     2.00 cm      LV E/e' lateral: 8.6 LV SV:         52 LV SV Index:   31 LVOT Area:     3.14 cm  LV Volumes (MOD) LV vol d, MOD A2C: 113.0 ml LV vol d, MOD A4C: 112.0 ml LV vol s, MOD A2C: 71.3 ml LV vol s, MOD A4C: 70.0 ml LV SV MOD A2C:     41.7 ml LV SV MOD A4C:     112.0 ml LV SV MOD BP:      39.6 ml RIGHT VENTRICLE TAPSE (M-mode): 1.5 cm LEFT ATRIUM             Index        RIGHT ATRIUM           Index LA diam:        1.80 cm 1.06 cm/m   RA Area:     11.00 cm LA Vol (A2C):   30.1 ml 17.80 ml/m  RA Volume:   22.90 ml  13.54 ml/m LA Vol (A4C):   15.8 ml 9.34 ml/m LA Biplane Vol: 22.5 ml 13.30 ml/m  AORTIC VALVE LVOT Vmax:   98.50 cm/s LVOT Vmean:  60.300 cm/s LVOT VTI:    0.167 m  AORTA Ao Root diam: 2.80 cm Ao Asc diam:  2.20 cm MITRAL VALVE MV Area (PHT): 3.13 cm    SHUNTS MV Decel Time: 243 msec    Systemic VTI:  0.17 m MV E velocity: 52.40 cm/s  Systemic Diam: 2.00 cm MV A velocity: 60.55 cm/s MV  E/A ratio:  0.87 Reginald Gathers MD Electronically signed by Reginald Gathers MD Signature Date/Time: 05/29/2023/4:39:45 PM    Final    CARDIAC CATHETERIZATION Result Date: 05/29/2023   Ost LAD  to Prox LAD lesion is 70% stenosed.   Mid LAD lesion is 80% stenosed.   1st Mrg lesion is 95% stenosed.   Prox Cx to Mid Cx lesion is 60% stenosed.   Prox RCA to Mid RCA lesion is 90% stenosed.   LV end diastolic pressure is mildly elevated. 3 vessel obstructive CAD Mildly elevated LVEDP Plan: will resume IV heparin. CT surgery consultation.   CT ANGIO CHEST/ABD/PEL FOR DISSECTION W &/OR WO CONTRAST Result Date: 05/28/2023 CLINICAL DATA:  Persistent chest pain radiating to arm * Tracking Code: BO * EXAM: CT ANGIOGRAPHY CHEST, ABDOMEN AND PELVIS TECHNIQUE: Non-contrast CT of the chest was initially obtained. Multidetector CT imaging through the chest, abdomen and pelvis was performed using the standard protocol during bolus administration of intravenous contrast. Multiplanar reconstructed images and MIPs were obtained and reviewed to evaluate the vascular anatomy. RADIATION DOSE REDUCTION: This exam was performed according to the departmental dose-optimization program which includes automated exposure control, adjustment of the mA and/or kV according to patient size and/or use of iterative reconstruction technique. CONTRAST:  OMNIPAQUE  IOHEXOL  350 MG/ML SOLN COMPARISON:  None Available. FINDINGS: CTA CHEST FINDINGS VASCULAR Aorta: Satisfactory opacification of the aorta. Normal contour and caliber of the thoracic aorta. No evidence of aneurysm, dissection, or other acute aortic pathology. Mild mixed calcific atherosclerosis of the thoracic aorta. Cardiovascular: No evidence of pulmonary embolism on limited non-tailored examination. Normal heart size. Left coronary artery calcifications. No pericardial effusion. Review of the MIP images confirms the above findings. NON VASCULAR Mediastinum/Nodes: No enlarged mediastinal, hilar, or axillary lymph nodes. Thyroid gland, trachea, and esophagus demonstrate no significant findings. Lungs/Pleura: Severe emphysema. Diffuse bilateral bronchial wall thickening.  Irregular nodule of the right pulmonary apex measuring 1.0 x 0.5 cm (series 7, image 25). No pleural effusion or pneumothorax. Musculoskeletal: No chest wall abnormality. No acute osseous findings. Review of the MIP images confirms the above findings. CTA ABDOMEN AND PELVIS FINDINGS VASCULAR Normal caliber of the abdominal aorta. Severe, irregular atherosclerosis of the infrarenal abdominal aorta. No evidence of aneurysm, dissection, or other acute aortic pathology. Duplicated bilateral renal arteries with otherwise standard branching pattern of the abdominal aorta. Review of the MIP images confirms the above findings. NON-VASCULAR Hepatobiliary: No solid liver abnormality is seen. Contracted gallbladder. No gallstones, gallbladder wall thickening, or biliary dilatation. Pancreas: Unremarkable. No pancreatic ductal dilatation or surrounding inflammatory changes. Spleen: Normal in size without significant abnormality. Adrenals/Urinary Tract: Adrenal glands are unremarkable. Kidneys are normal, without renal calculi, solid lesion, or hydronephrosis. Bladder is unremarkable. Stomach/Bowel: Stomach is within normal limits. Appendix appears normal. Descending and sigmoid diverticulosis. Severe, chronic wall thickening of the sigmoid colon (series 5, image 179). Lymphatic: No enlarged abdominal or pelvic lymph nodes. Reproductive: No mass or other significant abnormality. Other: No abdominal wall hernia or abnormality. No ascites. Musculoskeletal: No acute osseous findings. IMPRESSION: 1. Normal contour and caliber of the thoracic and abdominal aorta. Severe, irregular atherosclerosis of the infrarenal abdominal aorta. No evidence of aneurysm, dissection, or other acute aortic pathology. 2. Irregular nodule of the right pulmonary apex measuring 1.0 x 0.5 cm. This is modestly suspicious for primary lung malignancy. By Fleischner Society criteria Consider one of the following in 3 months for both low-risk and high-risk  individuals: (a) repeat chest CT, (b) follow-up PET-CT, or (  c) tissue sampling. This recommendation follows the consensus statement: Guidelines for Management of Incidental Pulmonary Nodules Detected on CT Images: From the Fleischner Society 2017; Radiology 2017; 284:228-243. 3. Severe emphysema and diffuse bilateral bronchial wall thickening. 4. Descending and sigmoid diverticulosis. Severe, chronic wall thickening of the sigmoid colon, most consistent with chronic sequelae of diverticulitis. 5. Coronary artery disease. Aortic Atherosclerosis (ICD10-I70.0) and Emphysema (ICD10-J43.9). Electronically Signed   By: Fredricka Jenny M.D.   On: 05/28/2023 15:19   I personally reviewed his cardiac catheterization images and his CT chest images.  There is severe three-vessel coronary disease by catheterization.  CT shows a 5 x 10 mm spiculated right upper lobe nodule.  No mediastinal or hilar adenopathy.  There are emphysematous changes of the lungs. Review of Systems  Constitutional:  Positive for fatigue. Negative for activity change.  HENT:  Negative for trouble swallowing and voice change.   Respiratory:  Positive for cough. Negative for shortness of breath and wheezing.   Cardiovascular:  Positive for chest pain. Negative for leg swelling.  Gastrointestinal:  Positive for abdominal pain.   Blood pressure (!) 138/91, pulse 76, temperature 97.6 F (36.4 C), temperature source Oral, resp. rate 19, height 5\' 8"  (1.727 m), weight 58.1 kg, SpO2 97%. Physical Exam Vitals reviewed.  Constitutional:      General: He is not in acute distress.    Appearance: He is well-developed.  HENT:     Head: Normocephalic and atraumatic.  Eyes:     General: No scleral icterus.    Extraocular Movements: Extraocular movements intact.  Neck:     Vascular: No carotid bruit.  Cardiovascular:     Rate and Rhythm: Normal rate and regular rhythm.     Heart sounds: Normal heart sounds. No murmur heard.    No friction rub. No  gallop.     Comments: Allen's test left arm slight delay with a refill Pulmonary:     Effort: Pulmonary effort is normal. No respiratory distress.     Breath sounds: Normal breath sounds. No wheezing or rales.  Abdominal:     General: There is no distension.     Palpations: Abdomen is soft.  Musculoskeletal:     Cervical back: Neck supple.     Right lower leg: No edema.     Left lower leg: No edema.  Lymphadenopathy:     Cervical: No cervical adenopathy.  Skin:    General: Skin is warm and dry.  Neurological:     General: No focal deficit present.     Mental Status: He is alert and oriented to person, place, and time.     Cranial Nerves: No cranial nerve deficit.     Motor: No weakness.    Assessment/Plan: Reginald [name] is a 58 year old man with a history of tobacco abuse, reflux, irritable bowel syndrome, and arthritis.  No prior cardiac history.  He presented with intermittent chest discomfort for over a month.  GI workup negative.  Cardiac workup revealed severe three-vessel disease with moderate left ventricular dysfunction.  Coronary bypass grafting is indicated for survival benefit and relief of symptoms.  I discussed the proposed operative procedure with Mr. and Mrs. Bloom.  I informed him of the general nature of the procedure including the need for general anesthesia, the incisions to be used, the possible use of the left radial artery, the use of cardiopulmonary bypass, the use of temporary pacemaker wires and drainage tubes postoperatively, the expected hospital stay, and the overall recovery.  I  informed them of the indications, risks, benefits, and alternatives.  They understand the risks include, but are not limited to death, MI, DVT, PE, bleeding, possible need for transfusion, infection, cardiac arrhythmias, respiratory or renal failure, as well as possibility of other unforeseeable complications.  The Allen's test on his left arm is normal but with slight delay.  Will see  what the vascular labs assessment of his palmar arch is.  May have to make a final decision regarding possible left radial use in the operating room.  He accepts the risk and agrees to proceed.  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  (T1, N0) will need additional workup including PET/CT as an outpatient.  Would not attempt to deal with that at the time of his cardiac surgery given the emphysema and high risk for an air leak.  Emphysema-has extensive emphysema on CT.  Clinically has not had issues and has a job where he is very active.  Tobacco abuse-emphasized the importance of complete cessation of all tobacco products but especially smoking tobacco.  Plan CABG, possible left radial artery harvest tomorrow 05/31/2023  Reginald [name] 05/30/2023, 1:30 PM

## 2023-05-30 NOTE — Telephone Encounter (Signed)
 Patient Product/process development scientist completed.    The patient is insured through [employer]. Patient has [employer], may use a copay card, and/or apply for patient assistance if available.    Ran test claim for Entresto 24-26 mg and the current 30 day co-pay is $138.10.  Ran test claim for Farxiga 10 mg and the current 30 day co-pay is $117.46.  Ran test claim for Jardiance 10 mg and the current 30 day co-pay is $123.28.  This test claim was processed through [facility]- copay amounts may vary at other pharmacies due to pharmacy/plan contracts, or as the patient moves through the different stages of their insurance plan.     Morgan Arab, CPHT Pharmacy Technician III Certified Patient Advocate [facility] Patient Advocate Team Direct Number: [phone]  Fax: [phone]

## 2023-05-30 NOTE — Progress Notes (Signed)
 PHARMACY - ANTICOAGULATION CONSULT NOTE  Pharmacy Consult for Heparin Indication: chest pain/ACS  Allergies  Allergen Reactions   Penicillins Rash    Immediate rash, facial/tongue/throat swelling, SOB or lightheadedness with hypotension     Patient Measurements: Height: 5\' 8"  (172.7 cm) Weight: 58.1 kg (128 lb 1.4 oz) IBW/kg (Calculated) : 68.4 HEPARIN DW (KG): 58.1  Vital Signs: Temp: 97.6 F (36.4 C) (05/08 0816) Temp Source: Oral (05/08 0816) BP: 138/91 (05/08 1038) Pulse Rate: 76 (05/08 1039)  Labs: Recent Labs    05/28/23 1418 05/28/23 1548 05/29/23 0339 05/29/23 1345 05/30/23 0445  HGB 15.3  --  15.4  --  16.7  HCT 44.7  --  45.8  --  49.9  PLT 204  --  202  --  222  APTT 31  --   --   --   --   LABPROT 13.3  --   --   --   --   INR 1.0  --   --   --   --   HEPARINUNFRC  --   --  0.16* 0.30 0.21*  CREATININE 0.94  --   --   --  0.99  TROPONINIHS 17 15  --   --   --     Estimated Creatinine Clearance: 67.7 mL/min (by C-G formula based on SCr of 0.99 mg/dL).   Medical History: Past Medical History:  Diagnosis Date   Arthritis    GERD (gastroesophageal reflux disease)    Headache    migraines    IBS (irritable bowel syndrome)     Medications:  See med rec  Assessment: 58 y.o. male presents with several weeks of intermittent chest pain. Comes with exertion but also at rest. Endorses SOB and diaphoresis with nausea and vomiting when it arises. Radiates into his left neck, left chest, and left arm . Concern for accelerating angina. Patient not on any anticoagulation. Pharmacy asked to start heparin  Pt s/p LHC with plans for surgical evaluation for CABG. Pharmacy to resumed heparin 2h after TR band removed 5/7.  Heparin level this morning is below goal at 0.21.  No known issues with IV infusion, no overt bleeding or complications noted.  Goal of Therapy:  Heparin level 0.3-0.7 units/ml Monitor platelets by anticoagulation protocol: Yes   Plan:   Increase IV heparin to 1150 units/hr Repeat heparin level in 6 hrs. Daily heparin level and CBC.  Joanell Mowers, Davey Erp, BCCP Clinical Pharmacist  05/30/2023 1:15 PM   [facility] pharmacy phone numbers are listed on [email or web address]

## 2023-05-30 NOTE — Progress Notes (Signed)
 CARDIAC REHAB PHASE I      Pre-op OHS education including OHS booklet, OHS handout, IS use, mobility importance, home needs at discharge and sternal precautions/move in the tube reviewed. All questions and concerns addressed. Will continue to follow.  [id number] Ronny Colas, RN BSN 05/30/2023 1:31 PM

## 2023-05-30 NOTE — Progress Notes (Signed)
 PHARMACY - ANTICOAGULATION CONSULT NOTE  Pharmacy Consult for Heparin Indication: chest pain/ACS  Allergies  Allergen Reactions   Penicillins Rash    PCN: Immediate rash, facial/tongue/throat swelling, SOB or lightheadedness with hypotension.   (He has received Ceftriaxone  IM on 06/10/2017 without adverse reaction /none reported).     Patient Measurements: Height: 5\' 8"  (172.7 cm) Weight: 58.1 kg (128 lb 1.4 oz) IBW/kg (Calculated) : 68.4 HEPARIN DW (KG): 58.1  Vital Signs: Temp: 97.6 F (36.4 C) (05/08 0816) Temp Source: Oral (05/08 0816) BP: 138/91 (05/08 1038) Pulse Rate: 76 (05/08 1039)  Labs: Recent Labs    05/28/23 1418 05/28/23 1418 05/28/23 1548 05/29/23 0339 05/29/23 1345 05/30/23 0445 05/30/23 1832  HGB 15.3  --   --  15.4  --  16.7  --   HCT 44.7  --   --  45.8  --  49.9  --   PLT 204  --   --  202  --  222  --   APTT 31  --   --   --   --   --   --   LABPROT 13.3  --   --   --   --   --   --   INR 1.0  --   --   --   --   --   --   HEPARINUNFRC  --    < >  --  0.16* 0.30 0.21* 0.33  CREATININE 0.94  --   --   --   --  0.99  --   TROPONINIHS 17  --  15  --   --   --   --    < > = values in this interval not displayed.    Estimated Creatinine Clearance: 67.7 mL/min (by C-G formula based on SCr of 0.99 mg/dL).  Medications:  See med rec  Assessment: 58 y.o. male presents with several weeks of intermittent chest pain. Comes with exertion but also at rest. Endorses SOB and diaphoresis with nausea and vomiting when it arises. Radiates into his left neck, left chest, and left arm . Concern for accelerating angina. Patient not on any anticoagulation. Pharmacy asked to start heparin.   Pt s/p LHC with plans for surgical evaluation for CABG. Pharmacy resumed heparin 2h after TR band removed 5/07.  Heparin level therapeutic on 1150 units/hr. No issues with heparin or s/sx bleeding reported.  Goal of Therapy:  Heparin level 0.3-0.7 units/ml Monitor platelets  by anticoagulation protocol: Yes   Plan:  Continue heparin infusion at 1150 units/hr Check heparin level in 6 hours and daily while on heparin Continue to monitor H&H and platelets  Thank you for allowing pharmacy to be a part of this patient's care.  Claudia Cuff, PharmD, BCPS Clinical Pharmacist

## 2023-05-31 ENCOUNTER — Inpatient Hospital Stay (HOSPITAL_COMMUNITY)

## 2023-05-31 ENCOUNTER — Encounter (HOSPITAL_COMMUNITY)

## 2023-05-31 ENCOUNTER — Encounter (HOSPITAL_COMMUNITY)
Admission: EM | Disposition: A | Discharge status: Home / Self Care | Payer: Self-pay | Source: Home / Self Care | Attending: Thoracic Surgery (Cardiothoracic Vascular Surgery)

## 2023-05-31 ENCOUNTER — Inpatient Hospital Stay (HOSPITAL_COMMUNITY): Payer: Self-pay | Admitting: Anesthesiology

## 2023-05-31 DIAGNOSIS — I25119 Atherosclerotic heart disease of native coronary artery with unspecified angina pectoris: Secondary | ICD-10-CM | POA: Diagnosis not present

## 2023-05-31 DIAGNOSIS — Z01810 Encounter for preprocedural cardiovascular examination: Secondary | ICD-10-CM

## 2023-05-31 DIAGNOSIS — I10 Essential (primary) hypertension: Secondary | ICD-10-CM

## 2023-05-31 DIAGNOSIS — I2510 Atherosclerotic heart disease of native coronary artery without angina pectoris: Secondary | ICD-10-CM

## 2023-05-31 DIAGNOSIS — F17210 Nicotine dependence, cigarettes, uncomplicated: Secondary | ICD-10-CM

## 2023-05-31 DIAGNOSIS — Z951 Presence of aortocoronary bypass graft: Secondary | ICD-10-CM

## 2023-05-31 HISTORY — PX: INTRAOPERATIVE TRANSESOPHAGEAL ECHOCARDIOGRAM: SHX5062

## 2023-05-31 HISTORY — PX: CORONARY ARTERY BYPASS GRAFT: SHX141

## 2023-05-31 LAB — CBC
HCT: 48.9 % (ref 39.0–52.0)
HCT: 35.7 % — ABNORMAL LOW (ref 39.0–52.0)
Hemoglobin: 16.6 g/dL (ref 13.0–17.0)
Hemoglobin: 11.9 g/dL — ABNORMAL LOW (ref 13.0–17.0)
MCH: 30.6 pg (ref 26.0–34.0)
MCH: 30.7 pg (ref 26.0–34.0)
MCHC: 33.9 g/dL (ref 30.0–36.0)
MCHC: 33.3 g/dL (ref 30.0–36.0)
MCV: 90.2 fL (ref 80.0–100.0)
MCV: 92.2 fL (ref 80.0–100.0)
Platelets: 221 10*3/uL (ref 150–400)
Platelets: 122 10*3/uL — ABNORMAL LOW (ref 150–400)
RBC: 5.42 MIL/uL (ref 4.22–5.81)
RBC: 3.87 MIL/uL — ABNORMAL LOW (ref 4.22–5.81)
RDW: 12.8 % (ref 11.5–15.5)
RDW: 12.6 % (ref 11.5–15.5)
WBC: 8.5 10*3/uL (ref 4.0–10.5)
WBC: 20.4 10*3/uL — ABNORMAL HIGH (ref 4.0–10.5)
nRBC: 0 % (ref 0.0–0.2)
nRBC: 0 % (ref 0.0–0.2)

## 2023-05-31 LAB — POCT I-STAT, CHEM 8
BUN: 15 mg/dL (ref 6–20)
BUN: 16 mg/dL (ref 6–20)
BUN: 14 mg/dL (ref 6–20)
BUN: 14 mg/dL (ref 6–20)
BUN: 14 mg/dL (ref 6–20)
Calcium, Ion: 1.23 mmol/L (ref 1.15–1.40)
Calcium, Ion: 1.2 mmol/L (ref 1.15–1.40)
Calcium, Ion: 1.04 mmol/L — ABNORMAL LOW (ref 1.15–1.40)
Calcium, Ion: 1.05 mmol/L — ABNORMAL LOW (ref 1.15–1.40)
Calcium, Ion: 1.12 mmol/L — ABNORMAL LOW (ref 1.15–1.40)
Chloride: 104 mmol/L (ref 98–111)
Chloride: 107 mmol/L (ref 98–111)
Chloride: 103 mmol/L (ref 98–111)
Chloride: 106 mmol/L (ref 98–111)
Chloride: 106 mmol/L (ref 98–111)
Creatinine, Ser: 0.8 mg/dL (ref 0.61–1.24)
Creatinine, Ser: 0.8 mg/dL (ref 0.61–1.24)
Creatinine, Ser: 0.8 mg/dL (ref 0.61–1.24)
Creatinine, Ser: 0.8 mg/dL (ref 0.61–1.24)
Creatinine, Ser: 0.8 mg/dL (ref 0.61–1.24)
Glucose, Bld: 134 mg/dL — ABNORMAL HIGH (ref 70–99)
Glucose, Bld: 138 mg/dL — ABNORMAL HIGH (ref 70–99)
Glucose, Bld: 158 mg/dL — ABNORMAL HIGH (ref 70–99)
Glucose, Bld: 179 mg/dL — ABNORMAL HIGH (ref 70–99)
Glucose, Bld: 172 mg/dL — ABNORMAL HIGH (ref 70–99)
HCT: 45 % (ref 39.0–52.0)
HCT: 43 % (ref 39.0–52.0)
HCT: 32 % — ABNORMAL LOW (ref 39.0–52.0)
HCT: 28 % — ABNORMAL LOW (ref 39.0–52.0)
HCT: 31 % — ABNORMAL LOW (ref 39.0–52.0)
Hemoglobin: 15.3 g/dL (ref 13.0–17.0)
Hemoglobin: 14.6 g/dL (ref 13.0–17.0)
Hemoglobin: 10.9 g/dL — ABNORMAL LOW (ref 13.0–17.0)
Hemoglobin: 9.5 g/dL — ABNORMAL LOW (ref 13.0–17.0)
Hemoglobin: 10.5 g/dL — ABNORMAL LOW (ref 13.0–17.0)
Potassium: 4.4 mmol/L (ref 3.5–5.1)
Potassium: 4.4 mmol/L (ref 3.5–5.1)
Potassium: 4.3 mmol/L (ref 3.5–5.1)
Potassium: 6 mmol/L — ABNORMAL HIGH (ref 3.5–5.1)
Potassium: 4.8 mmol/L (ref 3.5–5.1)
Sodium: 142 mmol/L (ref 135–145)
Sodium: 140 mmol/L (ref 135–145)
Sodium: 137 mmol/L (ref 135–145)
Sodium: 137 mmol/L (ref 135–145)
Sodium: 138 mmol/L (ref 135–145)
TCO2: 26 mmol/L (ref 22–32)
TCO2: 26 mmol/L (ref 22–32)
TCO2: 24 mmol/L (ref 22–32)
TCO2: 23 mmol/L (ref 22–32)
TCO2: 22 mmol/L (ref 22–32)

## 2023-05-31 LAB — BASIC METABOLIC PANEL WITH GFR
Anion gap: 11 (ref 5–15)
BUN: 13 mg/dL (ref 6–20)
CO2: 23 mmol/L (ref 22–32)
Calcium: 9.4 mg/dL (ref 8.9–10.3)
Chloride: 106 mmol/L (ref 98–111)
Creatinine, Ser: 0.87 mg/dL (ref 0.61–1.24)
GFR, Estimated: >60 mL/min (ref >60)
Glucose, Bld: 116 mg/dL — ABNORMAL HIGH (ref 70–99)
Potassium: 3.7 mmol/L (ref 3.5–5.1)
Sodium: 140 mmol/L (ref 135–145)

## 2023-05-31 LAB — POCT I-STAT EG7
Acid-base deficit: 1 mmol/L (ref 0.0–2.0)
Bicarbonate: 24.2 mmol/L (ref 20.0–28.0)
Calcium, Ion: 1.03 mmol/L — ABNORMAL LOW (ref 1.15–1.40)
HCT: 30 % — ABNORMAL LOW (ref 39.0–52.0)
Hemoglobin: 10.2 g/dL — ABNORMAL LOW (ref 13.0–17.0)
O2 Saturation: 73 %
Potassium: 4.2 mmol/L (ref 3.5–5.1)
Sodium: 138 mmol/L (ref 135–145)
TCO2: 25 mmol/L (ref 22–32)
pCO2, Ven: 43 mmHg — ABNORMAL LOW (ref 44–60)
pH, Ven: 7.358 (ref 7.25–7.43)
pO2, Ven: 40 mmHg (ref 32–45)

## 2023-05-31 LAB — POCT I-STAT 7, (LYTES, BLD GAS, ICA,H+H)
Acid-base deficit: 5 mmol/L — ABNORMAL HIGH (ref 0.0–2.0)
Acid-base deficit: 3 mmol/L — ABNORMAL HIGH (ref 0.0–2.0)
Acid-base deficit: 4 mmol/L — ABNORMAL HIGH (ref 0.0–2.0)
Acid-base deficit: 6 mmol/L — ABNORMAL HIGH (ref 0.0–2.0)
Acid-base deficit: 2 mmol/L (ref 0.0–2.0)
Bicarbonate: 21.5 mmol/L (ref 20.0–28.0)
Bicarbonate: 22.4 mmol/L (ref 20.0–28.0)
Bicarbonate: 22 mmol/L (ref 20.0–28.0)
Bicarbonate: 20.8 mmol/L (ref 20.0–28.0)
Bicarbonate: 23.6 mmol/L (ref 20.0–28.0)
Calcium, Ion: 1.23 mmol/L (ref 1.15–1.40)
Calcium, Ion: 0.98 mmol/L — ABNORMAL LOW (ref 1.15–1.40)
Calcium, Ion: 1.13 mmol/L — ABNORMAL LOW (ref 1.15–1.40)
Calcium, Ion: 1.24 mmol/L (ref 1.15–1.40)
Calcium, Ion: 1.18 mmol/L (ref 1.15–1.40)
HCT: 45 % (ref 39.0–52.0)
HCT: 29 % — ABNORMAL LOW (ref 39.0–52.0)
HCT: 28 % — ABNORMAL LOW (ref 39.0–52.0)
HCT: 31 % — ABNORMAL LOW (ref 39.0–52.0)
HCT: 30 % — ABNORMAL LOW (ref 39.0–52.0)
Hemoglobin: 15.3 g/dL (ref 13.0–17.0)
Hemoglobin: 9.9 g/dL — ABNORMAL LOW (ref 13.0–17.0)
Hemoglobin: 9.5 g/dL — ABNORMAL LOW (ref 13.0–17.0)
Hemoglobin: 10.5 g/dL — ABNORMAL LOW (ref 13.0–17.0)
Hemoglobin: 10.2 g/dL — ABNORMAL LOW (ref 13.0–17.0)
O2 Saturation: 100 %
O2 Saturation: 100 %
O2 Saturation: 100 %
O2 Saturation: 100 %
O2 Saturation: 100 %
Patient temperature: 36.3
Patient temperature: 36.3
Potassium: 4.4 mmol/L (ref 3.5–5.1)
Potassium: 4.5 mmol/L (ref 3.5–5.1)
Potassium: 4.8 mmol/L (ref 3.5–5.1)
Potassium: 4.1 mmol/L (ref 3.5–5.1)
Potassium: 4.3 mmol/L (ref 3.5–5.1)
Sodium: 141 mmol/L (ref 135–145)
Sodium: 139 mmol/L (ref 135–145)
Sodium: 138 mmol/L (ref 135–145)
Sodium: 140 mmol/L (ref 135–145)
Sodium: 142 mmol/L (ref 135–145)
TCO2: 23 mmol/L (ref 22–32)
TCO2: 24 mmol/L (ref 22–32)
TCO2: 23 mmol/L (ref 22–32)
TCO2: 22 mmol/L (ref 22–32)
TCO2: 25 mmol/L (ref 22–32)
pCO2 arterial: 44.1 mmHg (ref 32–48)
pCO2 arterial: 40.1 mmHg (ref 32–48)
pCO2 arterial: 45.1 mmHg (ref 32–48)
pCO2 arterial: 42.3 mmHg (ref 32–48)
pCO2 arterial: 41.8 mmHg (ref 32–48)
pH, Arterial: 7.296 — ABNORMAL LOW (ref 7.35–7.45)
pH, Arterial: 7.356 (ref 7.35–7.45)
pH, Arterial: 7.295 — ABNORMAL LOW (ref 7.35–7.45)
pH, Arterial: 7.296 — ABNORMAL LOW (ref 7.35–7.45)
pH, Arterial: 7.356 (ref 7.35–7.45)
pO2, Arterial: 499 mmHg — ABNORMAL HIGH (ref 83–108)
pO2, Arterial: 302 mmHg — ABNORMAL HIGH (ref 83–108)
pO2, Arterial: 285 mmHg — ABNORMAL HIGH (ref 83–108)
pO2, Arterial: 203 mmHg — ABNORMAL HIGH (ref 83–108)
pO2, Arterial: 212 mmHg — ABNORMAL HIGH (ref 83–108)

## 2023-05-31 LAB — HEPARIN LEVEL (UNFRACTIONATED): Heparin Unfractionated: 0.47 [IU]/mL (ref 0.30–0.70)

## 2023-05-31 LAB — APTT: aPTT: 34 s (ref 24–36)

## 2023-05-31 LAB — PROTIME-INR
INR: 1.5 — ABNORMAL HIGH (ref 0.8–1.2)
Prothrombin Time: 18.7 s — ABNORMAL HIGH (ref 11.4–15.2)

## 2023-05-31 LAB — ECHO INTRAOPERATIVE TEE
AR max vel: 1.56 cm2
AV Area VTI: 1.51 cm2
AV Area mean vel: 1.4 cm2
AV Mean grad: 5 mmHg
AV Peak grad: 9 mmHg
Ao pk vel: 1.5 m/s
Calc EF: 35.7 %
Height: 68 in
S' Lateral: 4.7 cm
Single Plane A2C EF: 43 %
Single Plane A4C EF: 31 %
Weight: 2049.4 [oz_av]

## 2023-05-31 LAB — HEMOGLOBIN AND HEMATOCRIT, BLOOD
HCT: 30.5 % — ABNORMAL LOW (ref 39.0–52.0)
Hemoglobin: 10.4 g/dL — ABNORMAL LOW (ref 13.0–17.0)

## 2023-05-31 LAB — GLUCOSE, CAPILLARY
Glucose-Capillary: 108 mg/dL — ABNORMAL HIGH (ref 70–99)
Glucose-Capillary: 147 mg/dL — ABNORMAL HIGH (ref 70–99)
Glucose-Capillary: 114 mg/dL — ABNORMAL HIGH (ref 70–99)
Glucose-Capillary: 138 mg/dL — ABNORMAL HIGH (ref 70–99)

## 2023-05-31 LAB — PLATELET COUNT: Platelets: 145 10*3/uL — ABNORMAL LOW (ref 150–400)

## 2023-05-31 LAB — TROPONIN I (HIGH SENSITIVITY): Troponin I (High Sensitivity): 13 ng/L (ref <18)

## 2023-05-31 LAB — LIPOPROTEIN A (LPA): Lipoprotein (a): 41.8 nmol/L — ABNORMAL HIGH (ref <75.0)

## 2023-05-31 SURGERY — CORONARY ARTERY BYPASS GRAFTING (CABG)
Anesthesia: General | Site: Chest

## 2023-05-31 MED ORDER — CALCIUM CHLORIDE 10 % IV SOLN
INTRAVENOUS | Status: AC
  Filled 2023-05-31: qty 10

## 2023-05-31 MED ORDER — LACTATED RINGERS IV SOLN: INTRAVENOUS | Status: DC | PRN

## 2023-05-31 MED ORDER — ASPIRIN 81 MG PO CHEW: 324.0000 mg | CHEWABLE_TABLET | Freq: Every day | ORAL | Status: DC

## 2023-05-31 MED ORDER — FENTANYL CITRATE (PF) 250 MCG/5ML IJ SOLN
INTRAMUSCULAR | Status: AC
  Filled 2023-05-31: qty 5

## 2023-05-31 MED ORDER — MIDAZOLAM HCL (PF) 5 MG/ML IJ SOLN
INTRAMUSCULAR | Status: DC | PRN
  Administered 2023-05-31 (×2): 2 mg via INTRAVENOUS
  Administered 2023-05-31: 1 mg via INTRAVENOUS
  Administered 2023-05-31: 2 mg via INTRAVENOUS
  Administered 2023-05-31 (×2): 1 mg via INTRAVENOUS

## 2023-05-31 MED ORDER — DEXTROSE 50 % IV SOLN: 0.0000 mL | INTRAVENOUS | Status: DC | PRN

## 2023-05-31 MED ORDER — PROMETHAZINE (PHENERGAN) 6.25MG IN NS 50ML IVPB
6.2500 mg | Freq: Once | INTRAVENOUS | Status: AC
  Administered 2023-05-31: 6.25 mg via INTRAVENOUS
  Filled 2023-05-31: qty 50

## 2023-05-31 MED ORDER — HEPARIN SODIUM (PORCINE) 1000 UNIT/ML IJ SOLN
INTRAMUSCULAR | Status: DC | PRN
  Administered 2023-05-31: 2000 [IU] via INTRAVENOUS
  Administered 2023-05-31: 23000 [IU] via INTRAVENOUS

## 2023-05-31 MED ORDER — ACETAMINOPHEN 160 MG/5ML PO SOLN
650.0000 mg | Freq: Once | ORAL | Status: AC
  Administered 2023-05-31: 650 mg
  Filled 2023-05-31: qty 20.3

## 2023-05-31 MED ORDER — PROTAMINE SULFATE 10 MG/ML IV SOLN
INTRAVENOUS | Status: DC | PRN
  Administered 2023-05-31: 250 mg via INTRAVENOUS
  Administered 2023-05-31: 50 mg via INTRAVENOUS

## 2023-05-31 MED ORDER — LACTATED RINGERS IV SOLN: INTRAVENOUS | Status: AC

## 2023-05-31 MED ORDER — ALBUMIN HUMAN 5 % IV SOLN
250.0000 mL | INTRAVENOUS | Status: DC | PRN
  Administered 2023-05-31: 12.5 g via INTRAVENOUS

## 2023-05-31 MED ORDER — SODIUM CHLORIDE 0.9 % IV SOLN: INTRAVENOUS | Status: DC | PRN

## 2023-05-31 MED ORDER — MORPHINE SULFATE (PF) 2 MG/ML IV SOLN
1.0000 mg | INTRAVENOUS | Status: DC | PRN
  Administered 2023-06-01 (×5): 4 mg via INTRAVENOUS
  Administered 2023-06-01 (×2): 2 mg via INTRAVENOUS
  Administered 2023-06-01: 4 mg via INTRAVENOUS
  Administered 2023-06-02 (×4): 2 mg via INTRAVENOUS
  Administered 2023-06-02 – 2023-06-03 (×4): 4 mg via INTRAVENOUS
  Filled 2023-05-31 (×3): qty 1
  Filled 2023-05-31 (×4): qty 2
  Filled 2023-05-31: qty 1
  Filled 2023-05-31 (×2): qty 2
  Filled 2023-05-31 (×2): qty 1
  Filled 2023-05-31 (×2): qty 2
  Filled 2023-05-31: qty 1
  Filled 2023-05-31: qty 2

## 2023-05-31 MED ORDER — SODIUM CHLORIDE 0.45 % IV SOLN: INTRAVENOUS | Status: AC | PRN

## 2023-05-31 MED ORDER — ALBUMIN HUMAN 5 % IV SOLN
INTRAVENOUS | Status: DC | PRN
Start: 2023-05-31 — End: 2023-05-31

## 2023-05-31 MED ORDER — SUCCINYLCHOLINE CHLORIDE 200 MG/10ML IV SOSY
PREFILLED_SYRINGE | INTRAVENOUS | Status: DC | PRN
  Administered 2023-05-31: 140 mg via INTRAVENOUS

## 2023-05-31 MED ORDER — SODIUM CHLORIDE 0.9% FLUSH
3.0000 mL | Freq: Two times a day (BID) | INTRAVENOUS | Status: DC
  Administered 2023-06-01 – 2023-06-02 (×3): 3 mL via INTRAVENOUS

## 2023-05-31 MED ORDER — BISACODYL 10 MG RE SUPP
10.0000 mg | Freq: Every day | RECTAL | Status: DC
  Filled 2023-05-31: qty 1

## 2023-05-31 MED ORDER — ROCURONIUM BROMIDE 10 MG/ML (PF) SYRINGE
PREFILLED_SYRINGE | INTRAVENOUS | Status: AC
  Filled 2023-05-31: qty 10

## 2023-05-31 MED ORDER — METOPROLOL TARTRATE 25 MG/10 ML ORAL SUSPENSION
12.5000 mg | Freq: Two times a day (BID) | ORAL | Status: DC
Start: 2023-05-31 — End: 2023-06-01

## 2023-05-31 MED ORDER — PROTAMINE SULFATE 10 MG/ML IV SOLN
INTRAVENOUS | Status: AC
  Filled 2023-05-31: qty 5

## 2023-05-31 MED ORDER — ONDANSETRON HCL 4 MG/2ML IJ SOLN
INTRAMUSCULAR | Status: DC | PRN
  Administered 2023-05-31 (×2): 4 mg via INTRAVENOUS

## 2023-05-31 MED ORDER — PHENYLEPHRINE 80 MCG/ML (10ML) SYRINGE FOR IV PUSH (FOR BLOOD PRESSURE SUPPORT)
PREFILLED_SYRINGE | INTRAVENOUS | Status: DC | PRN
  Administered 2023-05-31: 160 ug via INTRAVENOUS
  Administered 2023-05-31: 80 ug via INTRAVENOUS
  Administered 2023-05-31: 40 ug via INTRAVENOUS

## 2023-05-31 MED ORDER — DEXMEDETOMIDINE HCL IN NACL 400 MCG/100ML IV SOLN: 0.0000 ug/kg/h | INTRAVENOUS | Status: DC

## 2023-05-31 MED ORDER — MIDAZOLAM HCL (PF) 10 MG/2ML IJ SOLN
INTRAMUSCULAR | Status: AC
  Filled 2023-05-31: qty 2

## 2023-05-31 MED ORDER — PLASMA-LYTE A IV SOLN: INTRAVENOUS | Status: DC | PRN

## 2023-05-31 MED ORDER — INSULIN REGULAR(HUMAN) IN NACL 100-0.9 UT/100ML-% IV SOLN
INTRAVENOUS | Status: DC
Start: 2023-05-31 — End: 2023-06-01

## 2023-05-31 MED ORDER — CHLORHEXIDINE GLUCONATE 0.12 % MT SOLN
15.0000 mL | OROMUCOSAL | Status: AC
  Administered 2023-05-31: 15 mL via OROMUCOSAL
  Filled 2023-05-31: qty 15

## 2023-05-31 MED ORDER — SODIUM BICARBONATE 8.4 % IV SOLN
50.0000 meq | Freq: Once | INTRAVENOUS | Status: AC
  Administered 2023-05-31: 50 meq via INTRAVENOUS

## 2023-05-31 MED ORDER — HEPARIN SODIUM (PORCINE) 1000 UNIT/ML IJ SOLN
INTRAMUSCULAR | Status: AC
  Filled 2023-05-31: qty 1

## 2023-05-31 MED ORDER — LACTATED RINGERS IV SOLN
INTRAVENOUS | Status: DC | PRN
Start: 2023-05-31 — End: 2023-05-31

## 2023-05-31 MED ORDER — PROPOFOL 10 MG/ML IV BOLUS
INTRAVENOUS | Status: DC | PRN
  Administered 2023-05-31: 50 mg via INTRAVENOUS

## 2023-05-31 MED ORDER — METOPROLOL TARTRATE 5 MG/5ML IV SOLN
2.5000 mg | INTRAVENOUS | Status: DC | PRN
  Administered 2023-06-01 – 2023-06-02 (×3): 5 mg via INTRAVENOUS
  Filled 2023-05-31 (×4): qty 5

## 2023-05-31 MED ORDER — CHLORHEXIDINE GLUCONATE CLOTH 2 % EX PADS
6.0000 | MEDICATED_PAD | Freq: Every day | CUTANEOUS | Status: DC
  Administered 2023-06-01 – 2023-06-02 (×2): 6 via TOPICAL

## 2023-05-31 MED ORDER — PANTOPRAZOLE SODIUM 40 MG IV SOLR
40.0000 mg | Freq: Every day | INTRAVENOUS | Status: AC
  Administered 2023-05-31 – 2023-06-01 (×2): 40 mg via INTRAVENOUS
  Filled 2023-05-31 (×2): qty 10

## 2023-05-31 MED ORDER — PHENYLEPHRINE 80 MCG/ML (10ML) SYRINGE FOR IV PUSH (FOR BLOOD PRESSURE SUPPORT)
PREFILLED_SYRINGE | INTRAVENOUS | Status: AC
  Filled 2023-05-31: qty 10

## 2023-05-31 MED ORDER — DOCUSATE SODIUM 100 MG PO CAPS
200.0000 mg | ORAL_CAPSULE | Freq: Every day | ORAL | Status: DC
Start: 2023-06-01 — End: 2023-06-02
  Administered 2023-06-02: 200 mg via ORAL
  Filled 2023-05-31: qty 2

## 2023-05-31 MED ORDER — POTASSIUM CHLORIDE 10 MEQ/50ML IV SOLN: 10.0000 meq | INTRAVENOUS | Status: DC

## 2023-05-31 MED ORDER — SODIUM CHLORIDE 0.9 % IV SOLN: 250.0000 mL | INTRAVENOUS | Status: DC

## 2023-05-31 MED ORDER — ONDANSETRON HCL 4 MG/2ML IJ SOLN
INTRAMUSCULAR | Status: AC
  Filled 2023-05-31: qty 2

## 2023-05-31 MED ORDER — METOCLOPRAMIDE HCL 5 MG/ML IJ SOLN
10.0000 mg | Freq: Four times a day (QID) | INTRAMUSCULAR | Status: AC
  Administered 2023-05-31 – 2023-06-02 (×5): 10 mg via INTRAVENOUS
  Filled 2023-05-31 (×4): qty 2

## 2023-05-31 MED ORDER — EPINEPHRINE PF 1 MG/ML IJ SOLN
INTRAMUSCULAR | Status: DC | PRN
Start: 2023-05-31 — End: 2023-05-31
  Administered 2023-05-31 (×2): .005 mg via INTRAVENOUS
  Administered 2023-05-31: .01 mg via INTRAVENOUS

## 2023-05-31 MED ORDER — BISACODYL 5 MG PO TBEC
10.0000 mg | DELAYED_RELEASE_TABLET | Freq: Every day | ORAL | Status: DC
  Administered 2023-06-02 – 2023-06-04 (×2): 10 mg via ORAL
  Filled 2023-05-31 (×3): qty 2

## 2023-05-31 MED ORDER — PROPOFOL 1000 MG/100ML IV EMUL: 5.0000 ug/kg/min | INTRAVENOUS | Status: DC

## 2023-05-31 MED ORDER — ACETAMINOPHEN 160 MG/5ML PO SOLN
1000.0000 mg | Freq: Four times a day (QID) | ORAL | Status: AC
Start: 2023-06-01 — End: 2023-06-05

## 2023-05-31 MED ORDER — PHENYLEPHRINE HCL-NACL 20-0.9 MG/250ML-% IV SOLN: 0.0000 ug/min | INTRAVENOUS | Status: DC

## 2023-05-31 MED ORDER — SODIUM CHLORIDE 0.9 % IV SOLN: INTRAVENOUS | Status: AC

## 2023-05-31 MED ORDER — SODIUM CHLORIDE 0.9% FLUSH: 3.0000 mL | INTRAVENOUS | Status: DC | PRN

## 2023-05-31 MED ORDER — CHLORHEXIDINE GLUCONATE 0.12 % MT SOLN
15.0000 mL | Freq: Two times a day (BID) | OROMUCOSAL | Status: DC
  Administered 2023-06-01 – 2023-06-06 (×9): 15 mL via OROMUCOSAL
  Filled 2023-05-31 (×9): qty 15

## 2023-05-31 MED ORDER — HEMOSTATIC AGENTS (NO CHARGE) OPTIME
TOPICAL | Status: DC | PRN
Start: 2023-05-31 — End: 2023-05-31
  Administered 2023-05-31: 1 via TOPICAL

## 2023-05-31 MED ORDER — NITROGLYCERIN IN D5W 200-5 MCG/ML-% IV SOLN: 0.0000 ug/min | INTRAVENOUS | Status: DC

## 2023-05-31 MED ORDER — ROCURONIUM BROMIDE 10 MG/ML (PF) SYRINGE
PREFILLED_SYRINGE | INTRAVENOUS | Status: DC | PRN
  Administered 2023-05-31: 20 mg via INTRAVENOUS
  Administered 2023-05-31: 50 mg via INTRAVENOUS
  Administered 2023-05-31 (×2): 20 mg via INTRAVENOUS

## 2023-05-31 MED ORDER — MIDAZOLAM HCL 2 MG/2ML IJ SOLN
2.0000 mg | INTRAMUSCULAR | Status: DC | PRN
  Administered 2023-05-31: 2 mg via INTRAVENOUS
  Filled 2023-05-31: qty 2

## 2023-05-31 MED ORDER — ASPIRIN 325 MG PO TBEC
325.0000 mg | DELAYED_RELEASE_TABLET | Freq: Every day | ORAL | Status: DC
  Administered 2023-06-02 – 2023-06-05 (×3): 325 mg via ORAL
  Filled 2023-05-31 (×3): qty 1

## 2023-05-31 MED ORDER — ASPIRIN 81 MG PO CHEW
324.0000 mg | CHEWABLE_TABLET | Freq: Once | ORAL | Status: AC
  Administered 2023-05-31: 324 mg via ORAL
  Filled 2023-05-31: qty 4

## 2023-05-31 MED ORDER — VANCOMYCIN HCL IN DEXTROSE 1-5 GM/200ML-% IV SOLN
1000.0000 mg | Freq: Once | INTRAVENOUS | Status: AC
  Administered 2023-05-31: 1000 mg via INTRAVENOUS
  Filled 2023-05-31: qty 200

## 2023-05-31 MED ORDER — CALCIUM CHLORIDE 10 % IV SOLN
INTRAVENOUS | Status: DC | PRN
  Administered 2023-05-31: 300 mg via INTRAVENOUS
  Administered 2023-05-31: 400 mg via INTRAVENOUS
  Administered 2023-05-31: 300 mg via INTRAVENOUS

## 2023-05-31 MED ORDER — FENTANYL CITRATE (PF) 250 MCG/5ML IJ SOLN
INTRAMUSCULAR | Status: AC
Start: 2023-05-31 — End: ?
  Filled 2023-05-31: qty 5

## 2023-05-31 MED ORDER — PROPOFOL 1000 MG/100ML IV EMUL
INTRAVENOUS | Status: AC
  Filled 2023-05-31: qty 100

## 2023-05-31 MED ORDER — EPINEPHRINE HCL 5 MG/250ML IV SOLN IN NS: 0.5000 ug/min | INTRAVENOUS | Status: DC

## 2023-05-31 MED ORDER — ARTIFICIAL TEARS OPHTHALMIC OINT
TOPICAL_OINTMENT | OPHTHALMIC | Status: AC
Start: 2023-05-31 — End: ?
  Filled 2023-05-31: qty 3.5

## 2023-05-31 MED ORDER — FENTANYL CITRATE (PF) 250 MCG/5ML IJ SOLN
INTRAMUSCULAR | Status: DC | PRN
  Administered 2023-05-31: 150 ug via INTRAVENOUS
  Administered 2023-05-31: 100 ug via INTRAVENOUS
  Administered 2023-05-31 (×3): 50 ug via INTRAVENOUS
  Administered 2023-05-31: 150 ug via INTRAVENOUS
  Administered 2023-05-31 (×2): 50 ug via INTRAVENOUS

## 2023-05-31 MED ORDER — OXYCODONE HCL 5 MG PO TABS
5.0000 mg | ORAL_TABLET | ORAL | Status: DC | PRN
  Administered 2023-06-01: 5 mg via ORAL
  Administered 2023-06-02 – 2023-06-06 (×6): 10 mg via ORAL
  Filled 2023-05-31: qty 2
  Filled 2023-05-31: qty 1
  Filled 2023-05-31 (×6): qty 2

## 2023-05-31 MED ORDER — ETOMIDATE 2 MG/ML IV SOLN
INTRAVENOUS | Status: DC | PRN
  Administered 2023-05-31: 14 mg via INTRAVENOUS

## 2023-05-31 MED ORDER — ONDANSETRON HCL 4 MG/2ML IJ SOLN
4.0000 mg | Freq: Four times a day (QID) | INTRAMUSCULAR | Status: DC | PRN
  Administered 2023-06-01 – 2023-06-03 (×5): 4 mg via INTRAVENOUS
  Filled 2023-05-31 (×5): qty 2

## 2023-05-31 MED ORDER — ARTIFICIAL TEARS OPHTHALMIC OINT
TOPICAL_OINTMENT | OPHTHALMIC | Status: DC | PRN
  Administered 2023-05-31: 1 via OPHTHALMIC

## 2023-05-31 MED ORDER — MAGNESIUM SULFATE 4 GM/100ML IV SOLN
4.0000 g | Freq: Once | INTRAVENOUS | Status: AC
  Administered 2023-05-31: 4 g via INTRAVENOUS
  Filled 2023-05-31: qty 100

## 2023-05-31 MED ORDER — PROPOFOL 500 MG/50ML IV EMUL
INTRAVENOUS | Status: DC | PRN
  Administered 2023-05-31: 40 ug/kg/min via INTRAVENOUS

## 2023-05-31 MED ORDER — 0.9 % SODIUM CHLORIDE (POUR BTL) OPTIME
TOPICAL | Status: DC | PRN
  Administered 2023-05-31: 5000 mL

## 2023-05-31 MED ORDER — TRAMADOL HCL 50 MG PO TABS
50.0000 mg | ORAL_TABLET | ORAL | Status: DC | PRN
  Administered 2023-06-01: 100 mg via ORAL
  Filled 2023-05-31: qty 2

## 2023-05-31 MED ORDER — ACETAMINOPHEN 500 MG PO TABS
1000.0000 mg | ORAL_TABLET | Freq: Four times a day (QID) | ORAL | Status: AC
  Administered 2023-06-01 – 2023-06-05 (×10): 1000 mg via ORAL
  Filled 2023-05-31 (×12): qty 2

## 2023-05-31 MED ORDER — CEFAZOLIN SODIUM-DEXTROSE 2-4 GM/100ML-% IV SOLN
2.0000 g | Freq: Three times a day (TID) | INTRAVENOUS | Status: AC
  Administered 2023-05-31 – 2023-06-02 (×6): 2 g via INTRAVENOUS
  Filled 2023-05-31 (×5): qty 100

## 2023-05-31 MED ORDER — ROCURONIUM BROMIDE 10 MG/ML (PF) SYRINGE
PREFILLED_SYRINGE | INTRAVENOUS | Status: AC
Start: 2023-05-31 — End: ?
  Filled 2023-05-31: qty 10

## 2023-05-31 MED ORDER — SODIUM CHLORIDE (PF) 0.9 % IJ SOLN: OROMUCOSAL | Status: DC | PRN

## 2023-05-31 MED ORDER — LACTATED RINGERS IV SOLN: INTRAVENOUS | Status: DC

## 2023-05-31 MED ORDER — PROPOFOL 10 MG/ML IV BOLUS
INTRAVENOUS | Status: AC
  Filled 2023-05-31: qty 20

## 2023-05-31 MED ORDER — METOPROLOL TARTRATE 12.5 MG HALF TABLET: 12.5000 mg | ORAL_TABLET | Freq: Two times a day (BID) | ORAL | Status: DC

## 2023-05-31 MED ORDER — NOREPINEPHRINE 4 MG/250ML-% IV SOLN: 0.0000 ug/min | INTRAVENOUS | Status: DC

## 2023-05-31 MED ORDER — PANTOPRAZOLE SODIUM 40 MG PO TBEC
40.0000 mg | DELAYED_RELEASE_TABLET | Freq: Every day | ORAL | Status: DC
  Administered 2023-06-02 – 2023-06-06 (×4): 40 mg via ORAL
  Filled 2023-05-31 (×4): qty 1

## 2023-05-31 SURGICAL SUPPLY — 89 items
ADAPTER CARDIO PERF ANTE/RETRO (ADAPTER) ×1 IMPLANT
ADAPTER MULTI PERFUSION 15 (ADAPTER) ×3 IMPLANT
BAG DECANTER FOR FLEXI CONT (MISCELLANEOUS) ×3 IMPLANT
BLADE CLIPPER SURG (BLADE) ×3 IMPLANT
BLADE STERNUM SYSTEM 6 (BLADE) ×3 IMPLANT
BLADE SURG 15 STRL LF DISP TIS (BLADE) IMPLANT
BNDG ELASTIC 4INX 5YD STR LF (GAUZE/BANDAGES/DRESSINGS) ×2 IMPLANT
BNDG ELASTIC 6INX 5YD STR LF (GAUZE/BANDAGES/DRESSINGS) ×4 IMPLANT
BNDG GAUZE DERMACEA FLUFF 4 (GAUZE/BANDAGES/DRESSINGS) ×4 IMPLANT
CANISTER SUCTION 3000ML PPV (SUCTIONS) ×3 IMPLANT
CANNULA AORTIC ROOT 9FR (CANNULA) ×3 IMPLANT
CANNULA EZ GLIDE AORTIC 21FR (CANNULA) ×3 IMPLANT
CANNULA GUNDRY RCSP 15FR (MISCELLANEOUS) ×1 IMPLANT
CANNULA MC2 2 STG 36/46 CONN (CANNULA) ×1 IMPLANT
CANNULA VESSEL 3MM BLUNT TIP (CANNULA) ×10 IMPLANT
CATH ROBINSON RED A/P 18FR (CATHETERS) ×4 IMPLANT
CATH THORACIC 36FR (CATHETERS) ×3 IMPLANT
CATH THORACIC 36FR RT ANG (CATHETERS) ×3 IMPLANT
CLIP FOGARTY SPRING 6M (CLIP) ×1 IMPLANT
CLIP TI WIDE RED SMALL 24 (CLIP) ×2 IMPLANT
CONTAINER PROTECT SURGISLUSH (MISCELLANEOUS) ×6 IMPLANT
COVER MAYO STAND STRL (DRAPES) ×1 IMPLANT
DERMABOND ADVANCED .7 DNX12 (GAUZE/BANDAGES/DRESSINGS) ×2 IMPLANT
DRAPE EXTREMITY T 121X128X90 (DISPOSABLE) ×3 IMPLANT
DRAPE HALF SHEET 40X57 (DRAPES) ×1 IMPLANT
DRAPE SRG 135X102X78XABS (DRAPES) ×3 IMPLANT
DRAPE WARM FLUID 44X44 (DRAPES) ×3 IMPLANT
DRSG COVADERM 4X14 (GAUZE/BANDAGES/DRESSINGS) ×3 IMPLANT
ELECTRODE REM PT RTRN 9FT ADLT (ELECTROSURGICAL) ×6 IMPLANT
FELT TEFLON 1X6 (MISCELLANEOUS) ×5 IMPLANT
GAUZE 4X4 16PLY ~~LOC~~+RFID DBL (SPONGE) ×3 IMPLANT
GAUZE SPONGE 4X4 12PLY STRL (GAUZE/BANDAGES/DRESSINGS) ×7 IMPLANT
GLOVE SS BIOGEL STRL SZ 7.5 (GLOVE) ×5 IMPLANT
GOWN STRL REUS W/ TWL LRG LVL3 (GOWN DISPOSABLE) ×12 IMPLANT
GOWN STRL REUS W/ TWL XL LVL3 (GOWN DISPOSABLE) ×6 IMPLANT
HEMOSTAT POWDER SURGIFOAM 1G (HEMOSTASIS) ×9 IMPLANT
HEMOSTAT SURGICEL 2X14 (HEMOSTASIS) ×3 IMPLANT
KIT BASIN OR (CUSTOM PROCEDURE TRAY) ×3 IMPLANT
KIT SUCTION CATH 14FR (SUCTIONS) ×6 IMPLANT
KIT TURNOVER KIT B (KITS) ×3 IMPLANT
KIT VASOVIEW HEMOPRO 2 VH 4000 (KITS) ×3 IMPLANT
MARKER GRAFT CORONARY BYPASS (MISCELLANEOUS) ×9 IMPLANT
NS IRRIG 1000ML POUR BTL (IV SOLUTION) ×15 IMPLANT
PACK E OPEN HEART (SUTURE) ×3 IMPLANT
PACK OPEN HEART (CUSTOM PROCEDURE TRAY) ×3 IMPLANT
PAD ARMBOARD POSITIONER FOAM (MISCELLANEOUS) ×6 IMPLANT
PAD ELECT DEFIB RADIOL ZOLL (MISCELLANEOUS) ×3 IMPLANT
PENCIL BUTTON HOLSTER BLD 10FT (ELECTRODE) ×3 IMPLANT
POSITIONER HEAD DONUT 9IN (MISCELLANEOUS) ×3 IMPLANT
PUNCH AORTIC ROTATE 4.0MM (MISCELLANEOUS) ×1 IMPLANT
PUNCH AORTIC ROTATE 4.5MM 8IN (MISCELLANEOUS) IMPLANT
PUNCH AORTIC ROTATE 5MM 8IN (MISCELLANEOUS) IMPLANT
SET MPS 3-ND DEL (MISCELLANEOUS) ×1 IMPLANT
SHEARS HARMONIC 9CM CVD (BLADE) ×2 IMPLANT
SOLUTION ANTFG W/FOAM PAD STRL (MISCELLANEOUS) ×1 IMPLANT
SPONGE T-LAP 18X18 ~~LOC~~+RFID (SPONGE) ×2 IMPLANT
SPONGE T-LAP 4X18 ~~LOC~~+RFID (SPONGE) ×3 IMPLANT
STAPLER SKIN PROX 35W (STAPLE) ×1 IMPLANT
STAPLER SKIN PROX WIDE 3.9 (STAPLE) ×2 IMPLANT
STOPCOCK 4 WAY LG BORE MALE ST (IV SETS) ×1 IMPLANT
SUPPORT HEART JANKE-BARRON (MISCELLANEOUS) ×3 IMPLANT
SUT BONE WAX W31G (SUTURE) ×3 IMPLANT
SUT MNCRL AB 4-0 PS2 18 (SUTURE) ×1 IMPLANT
SUT PROLENE 3 0 SH DA (SUTURE) ×3 IMPLANT
SUT PROLENE 4 0 SH DA (SUTURE) ×1 IMPLANT
SUT PROLENE 4-0 RB1 .5 CRCL 36 (SUTURE) ×4 IMPLANT
SUT PROLENE 4-0 RB1 18X2 ARM (SUTURE) ×1 IMPLANT
SUT PROLENE 5 0 C 1 36 (SUTURE) ×2 IMPLANT
SUT PROLENE 6 0 C 1 30 (SUTURE) ×7 IMPLANT
SUT PROLENE 7 0 BV 1 (SUTURE) ×4 IMPLANT
SUT PROLENE 7 0 BV1 MDA (SUTURE) ×4 IMPLANT
SUT STEEL 6MS V (SUTURE) ×3 IMPLANT
SUT STEEL SZ 6 DBL 3X14 BALL (SUTURE) ×3 IMPLANT
SUT VIC AB 1 CTX36XBRD ANBCTR (SUTURE) ×6 IMPLANT
SUT VIC AB 2-0 CT1 TAPERPNT 27 (SUTURE) ×4 IMPLANT
SUT VIC AB 3-0 SH 27X BRD (SUTURE) ×1 IMPLANT
SUT VIC AB 3-0 X1 27 (SUTURE) ×1 IMPLANT
SYSTEM SAHARA CHEST DRAIN ATS (WOUND CARE) ×3 IMPLANT
TAPE CLOTH SURG 4X10 WHT LF (GAUZE/BANDAGES/DRESSINGS) ×1 IMPLANT
TAPE PAPER 2X10 WHT MICROPORE (GAUZE/BANDAGES/DRESSINGS) ×1 IMPLANT
TOWEL GREEN STERILE (TOWEL DISPOSABLE) ×3 IMPLANT
TOWEL GREEN STERILE FF (TOWEL DISPOSABLE) ×3 IMPLANT
TRAY FOLEY SLVR 14FR TEMP STAT (SET/KITS/TRAYS/PACK) ×1 IMPLANT
TRAY FOLEY SLVR 16FR TEMP STAT (SET/KITS/TRAYS/PACK) ×2 IMPLANT
TUBE SUCT INTRACARD DLP 20F (MISCELLANEOUS) ×3 IMPLANT
TUBE SUCTION CARDIAC 10FR (CANNULA) ×3 IMPLANT
TUBING LAP HI FLOW INSUFFLATIO (TUBING) ×3 IMPLANT
UNDERPAD 30X36 HEAVY ABSORB (UNDERPADS AND DIAPERS) ×3 IMPLANT
WATER STERILE IRR 1000ML POUR (IV SOLUTION) ×6 IMPLANT

## 2023-05-31 NOTE — Anesthesia Procedure Notes (Signed)
 Central Venous Catheter Insertion Performed by: Erin Havers, MD, anesthesiologist Start/End5/09/2023 11:13 AM, 05/31/2023 11:18 AM Patient location: Pre-op. Preanesthetic checklist: patient identified, IV checked, site marked, risks and benefits discussed, surgical consent, monitors and equipment checked, pre-op evaluation and timeout performed Position: Trendelenburg Hand hygiene performed  and maximum sterile barriers used  Total catheter length 100. PA cath was placed.Swan type:thermodilution PA Cath depth:48 Procedure performed without using ultrasound guided technique. Attempts: 1 Patient tolerated the procedure well with no immediate complications.

## 2023-05-31 NOTE — Brief Op Note (Signed)
 05/28/2023 - 05/31/2023  4:21 PM  PATIENT:  Mead L Tellado  58 y.o. male  PRE-OPERATIVE DIAGNOSIS:  CORONARY ARTERY DISEASE  POST-OPERATIVE DIAGNOSIS:  CORONARY ARTERY DISEASE  PROCEDURE:   CORONARY ARTERY BYPASS GRAFTING  ECHOCARDIOGRAM, TRANSESOPHAGEAL, INTRAOPERATIVE  Vein harvest time: Vein prep time:  -LIMA to LAD -SVG to PDA -SVG to OM  SURGEON: [name], MD   PHYSICIAN ASSISTANT: Debroah Fanning PA-C, Matt Song PA-C, Parris Bolognese PA-C  ASSISTANTS: Elmira Haddock RNFA   ANESTHESIA:   general  EBL:   BLOOD ADMINISTERED:none  DRAINS: Mediastinal and pleural tubes   LOCAL MEDICATIONS USED:  NONE  SPECIMEN:  Source of Specimen:  Internal mammary lymph node  DISPOSITION OF SPECIMEN:  PATHOLOGY  COUNTS:  Correct  DICTATION: .Dragon Dictation  PLAN OF CARE: Admit to inpatient   PATIENT DISPOSITION:  ICU - intubated and hemodynamically stable.   Delay start of Pharmacological VTE agent (>24hrs) due to surgical blood loss or risk of bleeding: yes

## 2023-05-31 NOTE — Progress Notes (Signed)
   05/31/23 2348  Vent Select  Invasive or Noninvasive Invasive  Adult Vent Y  Airway 8 mm  Placement Date/Time: 05/31/23 (c) 1347   Grade View: Grade 1  Airway Device: Endotracheal Tube  Laryngoscope Blade: MAC;4  ETT Types: Oral  Size (mm): 8 mm  Cuffed: Min.occ.pres.  Insertion attempts: 1  Airway Equipment: Stylet  Placement Confirmation...  Secured at (cm) 23 cm  Measured From Lips  Secured Location Left  Secured By English as a second language teacher No  Tube Holder Repositioned Yes  Cuff Pressure (cm H2O) Clear OR 27-39 CmH2O  Site Condition Dry  Adult Ventilator Settings  Vent Type Servo i  Humidity HME  Vent Mode SIMV;PRVC;PSV  Vt Set 540 mL  Set Rate (S)  4 bmp  FiO2 (%) (S)  40 %  Pressure Support 10 cmH20  PEEP 5 cmH20  Adult Ventilator Measurements  Peak Airway Pressure 15 L/min  Mean Airway Pressure 8 cmH20  Plateau Pressure 13 cmH20  Resp Rate Spontaneous 17 br/min  Resp Rate Total 17 br/min  Exhaled Vt 584 mL  Measured Ve 9.7 L  Auto PEEP 0 cmH20  Total PEEP 5 cmH20  SpO2 100 %  Adult Ventilator Alarms  Alarms On Y  Ve High Alarm 20 L/min  Ve Low Alarm 4 L/min  Resp Rate High Alarm 38 br/min  Resp Rate Low Alarm 8  PEEP Low Alarm 2 cmH2O  Press High Alarm 40 cmH2O  VAP Prevention  HOB> 30 Degrees Y  Breath Sounds  Bilateral Breath Sounds Clear;Diminished  Vent Respiratory Assessment  Respiratory Pattern Regular;Unlabored   Rapid wean started 2348

## 2023-05-31 NOTE — Plan of Care (Signed)
  Problem: Clinical Measurements: Goal: Respiratory complications will improve Outcome: Progressing Goal: Cardiovascular complication will be avoided Outcome: Progressing   Problem: Nutrition: Goal: Adequate nutrition will be maintained Outcome: Progressing   Problem: Pain Managment: Goal: General experience of comfort will improve and/or be controlled Outcome: Progressing   Problem: Safety: Goal: Ability to remain free from injury will improve Outcome: Progressing   Problem: Activity: Goal: Ability to tolerate increased activity will improve Outcome: Progressing   Problem: Cardiac: Goal: Ability to achieve and maintain adequate cardiovascular perfusion will improve Outcome: Progressing

## 2023-05-31 NOTE — Anesthesia Postprocedure Evaluation (Signed)
 Anesthesia Post Note  Patient: Reginald [name]  Procedure(s) Performed: CORONARY ARTERY BYPASS GRAFTING (CABG) TIMES THREE USING LEFT INTERNAL MAMMARY ARTERY AND OPENLY HARVESTED RIGHT AND LEFT GREATER SAPHENOUS VEIN (Chest) ECHOCARDIOGRAM, TRANSESOPHAGEAL, INTRAOPERATIVE     Patient location during evaluation: SICU Anesthesia Type: General Level of consciousness: sedated Pain management: pain level controlled Vital Signs Assessment: post-procedure vital signs reviewed and stable Respiratory status: patient remains intubated per anesthesia plan Cardiovascular status: stable Postop Assessment: no apparent nausea or vomiting Anesthetic complications: no   There were no known notable events for this encounter.  Last Vitals:  Vitals:   05/31/23 1134 05/31/23 1135  BP:    Pulse: 94 93  Resp: (!) 21 17  Temp:    SpO2: 93% 92%    Last Pain:  Vitals:   05/31/23 1041  TempSrc: Oral  PainSc:                  Reginald [name]

## 2023-05-31 NOTE — Anesthesia Procedure Notes (Signed)
 Procedure Name: Intubation Date/Time: 05/31/2023 1:47 PM  Performed by: Alisia Apple, CRNAPre-anesthesia Checklist: Patient identified, Emergency Drugs available, Suction available and Patient being monitored Patient Re-evaluated:Patient Re-evaluated prior to induction Oxygen Delivery Method: Circle system utilized Preoxygenation: Pre-oxygenation with 100% oxygen Induction Type: IV induction and Rapid sequence Laryngoscope Size: Mac and 4 Grade View: Grade I Tube type: Oral Tube size: 8.0 mm Number of attempts: 1 Airway Equipment and Method: Stylet Placement Confirmation: ETT inserted through vocal cords under direct vision, positive ETCO2 and breath sounds checked- equal and bilateral Secured at: 23 cm Tube secured with: Tape Dental Injury: Teeth and Oropharynx as per pre-operative assessment  Comments: intubated by C. Herbert Aguinaldo, CRNA; ebbs  diminished anteriorly

## 2023-05-31 NOTE — Progress Notes (Signed)
 At 2157H, patient complains of chest tightness, pain score 4/10. Vitals signs BP 139/93, HR 77, RR 18, SPO2 96% on RA. Patient denies any associated symptoms. Paged Dr. Michell Ahumada and Dr. Jacklyn Mash. EKG obtained (Unable to transfer EKG result to Epic, please see media in chart review). Dr Michell Ahumada ordered for STAT Troponin which cardiologist on call agreed and added NTG SL PRN.   At 2227H, chest tightness resolved after 2 doses NTG SL. Vital signs as follows: BP 137/77, HR 95, RR 18, SPO2 94 on RA.   Roda Lauture, RN

## 2023-05-31 NOTE — Interval H&P Note (Signed)
 History and Physical Interval Note:  05/31/2023 8:05 AM  Reginald [name]  has presented today for surgery, with the diagnosis of CAD.  The various methods of treatment have been discussed with the patient and family. After consideration of risks, benefits and other options for treatment, the patient has consented to  Procedure(s): CORONARY ARTERY BYPASS GRAFTING (CABG) (N/A) SURGICAL PROCUREMENT, ARTERY, RADIAL (Left) ECHOCARDIOGRAM, TRANSESOPHAGEAL, INTRAOPERATIVE (N/A) as a surgical intervention.  The patient's history has been reviewed, patient examined, no change in status, stable for surgery.  I have reviewed the patient's chart and labs.  Questions were answered to the patient's satisfaction.     [name]

## 2023-05-31 NOTE — Progress Notes (Signed)
      [address]       Reginald [name] [id number]             [phone]       S/p CABG  Just arrived in unit  BP (!) 172/128   Pulse 93   Temp 98.1 F (36.7 C) (Oral)   Resp 17   Ht 5\' 8"  (1.727 m)   Wt 58.1 kg   SpO2 92%   BMI 19.48 kg/m  18/8 CI 2.08 Minimal CT output  Doing well early postop  [name], MD [facility] [phone]

## 2023-05-31 NOTE — Anesthesia Procedure Notes (Signed)
 Arterial Line Insertion Start/End5/09/2023 11:15 AM, 05/31/2023 11:25 AM Performed by: Alisia Apple, CRNA, CRNA  Patient location: Pre-op. Preanesthetic checklist: patient identified, IV checked, site marked, risks and benefits discussed, surgical consent, monitors and equipment checked and pre-op evaluation Lidocaine  1% used for infiltration Left, radial was placed Catheter size: 20 G Hand hygiene performed , maximum sterile barriers used  and Seldinger technique used  Attempts: 2 Procedure performed using ultrasound guided technique. Ultrasound Notes:anatomy identified, needle tip was noted to be adjacent to the nerve/plexus identified and no ultrasound evidence of intravascular and/or intraneural injection Following insertion, dressing applied and Biopatch. Post procedure assessment: normal  Patient tolerated the procedure well with no immediate complications.

## 2023-05-31 NOTE — Hospital Course (Addendum)
 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  Needs further workup with PET/CT in outpatient setting.  HPI: Reginald [name] is a 58 year old man with a history of tobacco abuse, reflux, irritable bowel syndrome, and arthritis.  No prior cardiac history.   He presented on 05/28/2023 with a complaint of chest/epigastric pressure for several weeks.  He would get this both at rest and with exertion.  Sometimes will have radiation to his left neck and left arm.  He had been evaluated by GI including an upper endoscopy which was normal.   He presented to the emergency room and his high-sensitivity troponins were normal.  He had an echocardiogram which showed an ejection fraction of 35 to 40%.  No significant valvular pathology.  Yesterday he underwent cardiac catheterization and was found to have severe three-vessel coronary disease with a mildly elevated LVEDP.   Currently pain-free.   Also noted to have a 1 cm right upper lobe lung nodule.  CT showed advanced emphysematous change of the lungs along with the lung nodule.  There was no mediastinal or hilar adenopathy.  Dr. Luna Salinas reviewed the patient's diagnostic studies and determined he would benefit from surgical intervention. He reviewed the patient's treatment options as well as the risks and benefits of surgery. Reginald [name] was agreeable to proceed with surgery.  Hospital Course: Reginald [name] was admitted to [facility]. He remained stable and was brought to the operating room on 05/31/23. He underwent CABG x 3 utilizing the left internal mammary artery and saphenous from both thighs harvested with open technique. He tolerated the procedure well and was transferred to the SICU in stable condition.  He remained hemodynamically stable with minimal chest tube output.  He was weaned from the ventilator and extubated routinely by 1 AM on postop day 1.  Later in the morning on postop day 1, chest x-ray showed a large right  pneumothorax.  A pigtail catheter was placed at the bedside with resolution.  The mediastinal and left pleural tubes were removed.  He was mobilized and progressed well.  He was ready for transfer to 4E progressive care on postop day 2.  The right pleural tube was left in place for a persistent small airleak.  Hypertension was managed with metoprolol  and lisinopril.  He was continued on aspirin  and rosuvastatin  postoperatively.  Plavix was added started on 05/14. 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  Vascular surgery consult was obtained and due to the hand perfusion not being compromised, they did not feel revascularization or hematoma evacuation would be indicated at this time. Gabapentin was started for wrist pain. Wrist pain improved with time and ice. He began moving his bowels but continued to complain of nausea, this improved with time. He was optimized on GDMT by advanced cardiology. His chest tube remained in place with persistent air leak. His chest xray showed minimal basilar pneumothorax. He was transitioned to a mini express and follow up CXR showed similar right apical pneumothorax. Every other leg staple was removed on 05/15 without complication. Chest tube sutures were removed as well. He was saturating well on RA and his bowels were moving appropriately. His incisions were healing well without sign of infection. He was felt stable for discharge home.

## 2023-05-31 NOTE — Progress Notes (Signed)
 PHARMACY - ANTICOAGULATION CONSULT NOTE  Pharmacy Consult for Heparin  Indication: chest pain/ACS  Allergies  Allergen Reactions   Penicillins Rash    PCN: Immediate rash, facial/tongue/throat swelling, SOB or lightheadedness with hypotension.   (He has received Ceftriaxone  IM on 06/10/2017 without adverse reaction /none reported).     Patient Measurements: Height: 5\' 8"  (172.7 cm) Weight: 58.1 kg (128 lb 1.4 oz) IBW/kg (Calculated) : 68.4 HEPARIN  DW (KG): 58.1  Vital Signs: Temp: 97.3 F (36.3 C) (05/09 0417) Temp Source: Oral (05/09 0417) BP: 127/91 (05/09 0417) Pulse Rate: 67 (05/09 0417)  Labs: Recent Labs    05/28/23 1418 05/28/23 1548 05/29/23 0339 05/29/23 1345 05/30/23 0445 05/30/23 1832 05/30/23 2317 05/31/23 0417  HGB 15.3  --  15.4  --  16.7  --   --  16.6  HCT 44.7  --  45.8  --  49.9  --   --  48.9  PLT 204  --  202  --  222  --   --  221  APTT 31  --   --   --   --   --   --   --   LABPROT 13.3  --   --   --   --   --   --   --   INR 1.0  --   --   --   --   --   --   --   HEPARINUNFRC  --   --  0.16*   < > 0.21* 0.33  --  0.47  CREATININE 0.94  --   --   --  0.99  --   --  0.87  TROPONINIHS 17 15  --   --   --   --  13  --    < > = values in this interval not displayed.    Estimated Creatinine Clearance: 77 mL/min (by C-G formula based on SCr of 0.87 mg/dL).  Medications:  See med rec  Assessment: 58 y.o. male presents with several weeks of intermittent chest pain. Comes with exertion but also at rest. Endorses SOB and diaphoresis with nausea and vomiting when it arises. Radiates into his left neck, left chest, and left arm . Concern for accelerating angina. Patient not on any anticoagulation. Pharmacy asked to start heparin .   Pt s/p LHC with plans for surgical evaluation for CABG. Pharmacy resumed heparin  2h after TR band removed 5/07.  Heparin  level therapeutic on 1150 units/hr. No issues with heparin  or s/sx bleeding reported.  Goal of  Therapy:  Heparin  level 0.3-0.7 units/ml Monitor platelets by anticoagulation protocol: Yes   Plan:  Continue heparin  infusion at 1150 units/hr Continue to monitor H&H and platelets   Levin Reamer, PharmD, BCPS, [facility] Clinical Pharmacist [phone] Please check AMION for all [facility] Pharmacy numbers 05/31/2023

## 2023-05-31 NOTE — Anesthesia Preprocedure Evaluation (Signed)
 Anesthesia Evaluation  Patient identified by MRN, date of birth, ID band Patient awake    Reviewed: Allergy & Precautions, NPO status , Patient's Chart, lab work & pertinent test results  Airway Mallampati: II  TM Distance: >3 FB Neck ROM: Full    Dental  (+) Teeth Intact, Dental Advisory Given   Pulmonary Current Smoker   Pulmonary exam normal breath sounds clear to auscultation       Cardiovascular hypertension, + angina  + CAD  Normal cardiovascular exam Rhythm:Regular Rate:Normal  Coronary artery disease involving native coronary artery of native heart without angina pectoris  Echo 05/29/23: 1. Left ventricular ejection fraction, by estimation, is 35 to 40%. The  left ventricle has moderately decreased function. The left ventricle  demonstrates regional wall motion abnormalities (see scoring  diagram/findings for description). Left ventricular   diastolic parameters are consistent with Grade I diastolic dysfunction  (impaired relaxation).   2. Right ventricular systolic function is normal. The right ventricular  size is normal. Tricuspid regurgitation signal is inadequate for assessing  PA pressure.   3. The mitral valve is normal in structure. No evidence of mitral valve  regurgitation. No evidence of mitral stenosis.   4. The aortic valve is normal in structure. Aortic valve regurgitation is  not visualized. No aortic stenosis is present.   5. The inferior vena cava is normal in size with greater than 50%  respiratory variability, suggesting right atrial pressure of 3 mmHg.     Neuro/Psych  Headaches    GI/Hepatic Neg liver ROS,GERD  Medicated,,  Endo/Other  negative endocrine ROS    Renal/GU negative Renal ROS     Musculoskeletal  (+) Arthritis ,    Abdominal   Peds  Hematology negative hematology ROS (+)   Anesthesia Other Findings Day of surgery medications reviewed with the patient.   Reproductive/Obstetrics                             Anesthesia Physical Anesthesia Plan  ASA: 4  Anesthesia Plan: General   Post-op Pain Management:    Induction: Intravenous  PONV Risk Score and Plan: 1 and Midazolam  and Treatment may vary due to age or medical condition  Airway Management Planned: Oral ETT  Additional Equipment: Arterial line, CVP, TEE, PA Cath and Ultrasound Guidance Line Placement  Intra-op Plan:   Post-operative Plan: Post-operative intubation/ventilation  Informed Consent: I have reviewed the patients History and Physical, chart, labs and discussed the procedure including the risks, benefits and alternatives for the proposed anesthesia with the patient or authorized representative who has indicated his/her understanding and acceptance.     Dental advisory given  Plan Discussed with: CRNA  Anesthesia Plan Comments:        Anesthesia Quick Evaluation

## 2023-05-31 NOTE — Transfer of Care (Signed)
 Immediate Anesthesia Transfer of Care Note  Patient: Reginald [name]  Procedure(s) Performed: CORONARY ARTERY BYPASS GRAFTING (CABG) TIMES THREE USING LEFT INTERNAL MAMMARY ARTERY AND OPENLY HARVESTED RIGHT AND LEFT GREATER SAPHENOUS VEIN (Chest) ECHOCARDIOGRAM, TRANSESOPHAGEAL, INTRAOPERATIVE  Patient Location: ICU  Anesthesia Type:General  Level of Consciousness: sedated and Patient remains intubated per anesthesia plan  Airway & Oxygen Therapy: Patient remains intubated per anesthesia plan and Patient placed on Ventilator (see vital sign flow sheet for setting)  Post-op Assessment: Report given to RN and Post -op Vital signs reviewed and stable  Post vital signs: Reviewed and stable  Last Vitals:  Vitals Value Taken Time  BP 114/60 05/31/23 1945  Temp 96.5 05/31/23 1945  Pulse 89 05/31/23 194  Resp 16 05/31/23 1949  SpO2 100 % 05/31/23 1949  Vitals shown include unfiled device data.  Last Pain:  Vitals:   05/31/23 1041  TempSrc: Oral  PainSc:       Patients Stated Pain Goal: 3 (05/31/23 1058)  Complications: No notable events documented.

## 2023-05-31 NOTE — Anesthesia Procedure Notes (Signed)
 Central Venous Catheter Insertion Performed by: Erin Havers, MD, anesthesiologist Start/End5/09/2023 11:03 AM, 05/31/2023 11:13 AM Patient location: Pre-op. Preanesthetic checklist: patient identified, IV checked, site marked, risks and benefits discussed, surgical consent, monitors and equipment checked, pre-op evaluation, timeout performed and anesthesia consent Position: Trendelenburg Lidocaine  1% used for infiltration and patient sedated Hand hygiene performed , maximum sterile barriers used  and Seldinger technique used Catheter size: 9 Fr Central line was placed.MAC introducer Procedure performed using ultrasound guided technique. Ultrasound Notes:anatomy identified, needle tip was noted to be adjacent to the nerve/plexus identified, no ultrasound evidence of intravascular and/or intraneural injection and image(s) printed for medical record Attempts: 1 Following insertion, line sutured, dressing applied and Biopatch. Post procedure assessment: free fluid flow, blood return through all ports and no air  Patient tolerated the procedure well with no immediate complications.

## 2023-05-31 NOTE — Progress Notes (Signed)
  Progress Note   Patient: Reginald [name] WUJ:[id number] DOB: 04/29/1965 DOA: 05/28/2023     3 DOS: the patient was seen and examined on 05/31/2023   Brief hospital course: 58 year old man with PMH of arthritis, GERD, IBS who presented to the ED with anginal chest pain.  Cardiology was consulted.  CT chest revealed a right pulmonary nodule concerning for malignancy and pulmonology was consulted. 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  Cardiothoracic surgery was consulted.  Assessment and Plan:  ACS/unstable angina Troponins were negative (17 -> 15). EKG was unchanged from prior. HbA1c: 5.4. Lipid panel:TC: 223; LDL: 162; HDL: 38 Cardiology was consulted. Patient started on aspirin , heparin  GTT. Echocardiogram showed EF 35-40% with regional wall motion abnormalities.  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  - Continue heparin  gtt. - continue statin, aspirin .   Pulm Nodule: R apex.  Concerning for malignancy.  Pt w/ >60pack years of tobacco use.  Asymptomatic. New finding.  Pulmonology consulted and recommended outpatient PFT and PET scan. If positive, he needs Navigational bronchoscopy, bx, possible EBUS. If negative PET scan, re CT chest after 3 months.  -Outpatient follow-up as above.   COPD Due to smoking. No respiratory decompensation -Will continue to monitor   HTN Poorly controlled -Per cards rec, start Norvasc  5mg    Tobacco use Currently smokes 1.5ppd (as many as 2.5ppd). 60+pack years.  - Nicotine  patch.    IBS/GERD - continue Bentyl ,PPI      Subjective: Patient was taken for surgery before I was able to round on him. I did not see him today.    Physical Exam: Vitals:   05/31/23 1132 05/31/23 1133 05/31/23 1134 05/31/23 1135  BP:      Pulse: 96 99 94 93  Resp: (!) 28 17 (!) 21 17  Temp:      TempSrc:      SpO2: 96% 96% 93% 92%  Weight:       Height:       Data Reviewed:      Latest Ref Rng & Units 05/31/2023    3:54 PM 05/31/2023    2:04 PM 05/31/2023    2:00 PM  CBC  Hemoglobin 13.0 - 17.0 g/dL 95.6  21.3  08.6   Hematocrit 39.0 - 52.0 % 43.0  45.0  45.0       Latest Ref Rng & Units 05/31/2023    3:54 PM 05/31/2023    2:04 PM 05/31/2023    2:00 PM  BMP  Glucose 70 - 99 mg/dL 578   469   BUN 6 - 20 mg/dL 16   15   Creatinine 6.29 - 1.24 mg/dL 5.28   4.13   Sodium 244 - 145 mmol/L 140  141  142   Potassium 3.5 - 5.1 mmol/L 4.4  4.4  4.4   Chloride 98 - 111 mmol/L 107   104      Family Communication: n/a  Disposition: Status is: Inpatient Remains inpatient appropriate because: ACS on heparin  gtt. Pending CABG.  Planned Discharge Destination: Home    Time spent: n/a  Author: MDALA-GAUSI, Avyn Coate AGATHA, MD 05/31/2023 4:13 PM  For on call review [email or web address].

## 2023-06-01 ENCOUNTER — Inpatient Hospital Stay (HOSPITAL_COMMUNITY)

## 2023-06-01 LAB — BASIC METABOLIC PANEL WITH GFR
Anion gap: 9 (ref 5–15)
BUN: 11 mg/dL (ref 6–20)
CO2: 22 mmol/L (ref 22–32)
Calcium: 7.6 mg/dL — ABNORMAL LOW (ref 8.9–10.3)
Chloride: 109 mmol/L (ref 98–111)
Creatinine, Ser: 0.95 mg/dL (ref 0.61–1.24)
GFR, Estimated: >60 mL/min (ref >60)
Glucose, Bld: 137 mg/dL — ABNORMAL HIGH (ref 70–99)
Potassium: 3.9 mmol/L (ref 3.5–5.1)
Sodium: 140 mmol/L (ref 135–145)

## 2023-06-01 LAB — POCT I-STAT 7, (LYTES, BLD GAS, ICA,H+H)
Acid-base deficit: 3 mmol/L — ABNORMAL HIGH (ref 0.0–2.0)
Bicarbonate: 21.1 mmol/L (ref 20.0–28.0)
Calcium, Ion: 1.12 mmol/L — ABNORMAL LOW (ref 1.15–1.40)
HCT: 28 % — ABNORMAL LOW (ref 39.0–52.0)
Hemoglobin: 9.5 g/dL — ABNORMAL LOW (ref 13.0–17.0)
O2 Saturation: 95 %
Patient temperature: 38
Potassium: 4.1 mmol/L (ref 3.5–5.1)
Sodium: 141 mmol/L (ref 135–145)
TCO2: 22 mmol/L (ref 22–32)
pCO2 arterial: 35.8 mmHg (ref 32–48)
pH, Arterial: 7.384 (ref 7.35–7.45)
pO2, Arterial: 80 mmHg — ABNORMAL LOW (ref 83–108)

## 2023-06-01 LAB — CBC
HCT: 33.3 % — ABNORMAL LOW (ref 39.0–52.0)
Hemoglobin: 11 g/dL — ABNORMAL LOW (ref 13.0–17.0)
MCH: 30.7 pg (ref 26.0–34.0)
MCHC: 33 g/dL (ref 30.0–36.0)
MCV: 93 fL (ref 80.0–100.0)
Platelets: 119 10*3/uL — ABNORMAL LOW (ref 150–400)
RBC: 3.58 MIL/uL — ABNORMAL LOW (ref 4.22–5.81)
RDW: 12.7 % (ref 11.5–15.5)
WBC: 13.1 10*3/uL — ABNORMAL HIGH (ref 4.0–10.5)
nRBC: 0 % (ref 0.0–0.2)

## 2023-06-01 LAB — GLUCOSE, CAPILLARY
Glucose-Capillary: 132 mg/dL — ABNORMAL HIGH (ref 70–99)
Glucose-Capillary: 146 mg/dL — ABNORMAL HIGH (ref 70–99)
Glucose-Capillary: 129 mg/dL — ABNORMAL HIGH (ref 70–99)
Glucose-Capillary: 127 mg/dL — ABNORMAL HIGH (ref 70–99)
Glucose-Capillary: 124 mg/dL — ABNORMAL HIGH (ref 70–99)
Glucose-Capillary: 142 mg/dL — ABNORMAL HIGH (ref 70–99)
Glucose-Capillary: 147 mg/dL — ABNORMAL HIGH (ref 70–99)
Glucose-Capillary: 152 mg/dL — ABNORMAL HIGH (ref 70–99)

## 2023-06-01 LAB — MAGNESIUM: Magnesium: 2.1 mg/dL (ref 1.7–2.4)

## 2023-06-01 MED ORDER — FUROSEMIDE 10 MG/ML IJ SOLN
20.0000 mg | Freq: Two times a day (BID) | INTRAMUSCULAR | Status: DC
  Administered 2023-06-01 – 2023-06-02 (×3): 20 mg via INTRAVENOUS
  Filled 2023-06-01 (×3): qty 2

## 2023-06-01 MED ORDER — ENOXAPARIN SODIUM 40 MG/0.4ML IJ SOSY
40.0000 mg | PREFILLED_SYRINGE | Freq: Every day | INTRAMUSCULAR | Status: DC
  Administered 2023-06-01 – 2023-06-05 (×5): 40 mg via SUBCUTANEOUS
  Filled 2023-06-01 (×5): qty 0.4

## 2023-06-01 MED ORDER — METOPROLOL TARTRATE 25 MG PO TABS
25.0000 mg | ORAL_TABLET | Freq: Two times a day (BID) | ORAL | Status: DC
  Administered 2023-06-01 – 2023-06-02 (×4): 25 mg via ORAL
  Filled 2023-06-01 (×4): qty 1

## 2023-06-01 MED ORDER — INSULIN ASPART 100 UNIT/ML IJ SOLN
0.0000 [IU] | INTRAMUSCULAR | Status: DC
  Administered 2023-06-01 – 2023-06-02 (×3): 2 [IU] via SUBCUTANEOUS

## 2023-06-01 MED ORDER — LIDOCAINE 5 % EX PTCH
1.0000 | MEDICATED_PATCH | CUTANEOUS | Status: DC
  Administered 2023-06-01 – 2023-06-05 (×5): 1 via TRANSDERMAL
  Filled 2023-06-01 (×5): qty 1

## 2023-06-01 MED ORDER — METOPROLOL TARTRATE 25 MG/10 ML ORAL SUSPENSION
25.0000 mg | Freq: Two times a day (BID) | ORAL | Status: DC
  Filled 2023-06-01 (×2): qty 10

## 2023-06-01 NOTE — Plan of Care (Signed)
  Problem: Education: Goal: Knowledge of General Education information will improve Description: Including pain rating scale, medication(s)/side effects and non-pharmacologic comfort measures Outcome: Progressing   Problem: Clinical Measurements: Goal: Cardiovascular complication will be avoided Outcome: Progressing   Problem: Nutrition: Goal: Adequate nutrition will be maintained Outcome: Progressing   Problem: Coping: Goal: Level of anxiety will decrease Outcome: Progressing   Problem: Safety: Goal: Ability to remain free from injury will improve Outcome: Progressing   Problem: Skin Integrity: Goal: Risk for impaired skin integrity will decrease Outcome: Progressing

## 2023-06-01 NOTE — Progress Notes (Signed)
      [address]       [place] [address]             [phone]      POD # 1 CABG  More comfortable after right pleural tube placed  BP (!) 147/105   Pulse 86   Temp 100.2 F (37.9 C)   Resp (!) 21   Ht 5\' 8"  (1.727 m)   Wt 60.1 kg   SpO2 99%   BMI 20.15 kg/m    Intake/Output Summary (Last 24 hours) at 06/01/2023 1715 Last data filed at 06/01/2023 1443 Gross per 24 hour  Intake 2768.05 ml  Output 2670 ml  Net 98.05 ml   CBG moderately elevated  Overall doing well  [name], MD [facility] [phone]

## 2023-06-01 NOTE — Plan of Care (Signed)
  Problem: Clinical Measurements: Goal: Cardiovascular complication will be avoided Outcome: Progressing   Problem: Clinical Measurements: Goal: Respiratory complications will improve Outcome: Progressing   Problem: Clinical Measurements: Goal: Diagnostic test results will improve Outcome: Progressing   Problem: Clinical Measurements: Goal: Ability to maintain clinical measurements within normal limits will improve Outcome: Progressing

## 2023-06-01 NOTE — Progress Notes (Signed)
 1 Day Post-Op Procedure(s) (LRB): CORONARY ARTERY BYPASS GRAFTING (CABG) TIMES THREE USING LEFT INTERNAL MAMMARY ARTERY AND OPENLY HARVESTED RIGHT AND LEFT GREATER SAPHENOUS VEIN (N/A) ECHOCARDIOGRAM, TRANSESOPHAGEAL, INTRAOPERATIVE (N/A) Subjective: C/o pain  Objective: Vital signs in last 24 hours: Temp:  [97.3 F (36.3 C)-100.6 F (38.1 C)] 100.2 F (37.9 C) (05/10 0700) Pulse Rate:  [75-99] 95 (05/10 0700) Cardiac Rhythm: Normal sinus rhythm (05/10 0000) Resp:  [9-28] 28 (05/10 0700) BP: (89-172)/(70-128) 162/109 (05/10 0700) SpO2:  [89 %-100 %] 97 % (05/10 0700) Arterial Line BP: (81-109)/(58-95) 97/93 (05/10 0200) FiO2 (%):  [40 %-50 %] 40 % (05/10 0009) Weight:  [60.1 kg] 60.1 kg (05/10 0600)  Hemodynamic parameters for last 24 hours: PAP: (21-38)/(13-24) 28/18 CVP:  [6 mmHg-28 mmHg] 12 mmHg  Intake/Output from previous day: 05/09 0701 - 05/10 0700 In: 4817.3 [I.V.:3012.1; Blood:315; IV Piggyback:1490.2] Out: 2120 [Urine:1550; Chest Tube:570] Intake/Output this shift: No intake/output data recorded.  General appearance: alert, cooperative, and no distress Neurologic: intact Heart: regular rate and rhythm Lungs: diminished breath sounds on right Abdomen: mildly distended  Lab Results: Recent Labs    05/31/23 2020 05/31/23 2142 06/01/23 0035 06/01/23 0421  WBC 20.4*  --   --  13.1*  HGB 11.9*   < > 9.5* 11.0*  HCT 35.7*   < > 28.0* 33.3*  PLT 122*  --   --  119*   < > = values in this interval not displayed.   BMET:  Recent Labs    05/31/23 0417 05/31/23 1400 05/31/23 1847 05/31/23 2000 06/01/23 0035 06/01/23 0421  NA 140   < > 138   < > 141 140  K 3.7   < > 4.8   < > 4.1 3.9  CL 106   < > 106  --   --  109  CO2 23  --   --   --   --  22  GLUCOSE 116*   < > 172*  --   --  137*  BUN 13   < > 14  --   --  11  CREATININE 0.87   < > 0.80  --   --  0.95  CALCIUM  9.4  --   --   --   --  7.6*   < > = values in this interval not displayed.    PT/INR:   Recent Labs    05/31/23 2020  LABPROT 18.7*  INR 1.5*   ABG    Component Value Date/Time   PHART 7.384 06/01/2023 0035   HCO3 21.1 06/01/2023 0035   TCO2 22 06/01/2023 0035   ACIDBASEDEF 3.0 (H) 06/01/2023 0035   O2SAT 95 06/01/2023 0035   CBG (last 3)  Recent Labs    06/01/23 0346 06/01/23 0555 06/01/23 0758  GLUCAP 129* 127* 124*    Assessment/Plan: S/P Procedure(s) (LRB): CORONARY ARTERY BYPASS GRAFTING (CABG) TIMES THREE USING LEFT INTERNAL MAMMARY ARTERY AND OPENLY HARVESTED RIGHT AND LEFT GREATER SAPHENOUS VEIN (N/A) ECHOCARDIOGRAM, TRANSESOPHAGEAL, INTRAOPERATIVE (N/A) POD # 1 NEURO- intact Cv- in SR, BP relatively high, CVP 11  Off Epi  ASA, beta blocker, statin RESP- CXR shows a large right pneumothorax- will place pigtail catheter  IS RENAL- creatinine normal, lytes OK  Diurese ENDO- mildly elevated  Transition off insulin  drip GI- advance diet as tolerated Anemia- mild, monitor Thrombocytopenia- mild, monitor Dc mediastinal and left pleural tubes Cardiac rehab  LOS: 4 days    Reginald [name] 06/01/2023

## 2023-06-01 NOTE — Procedures (Signed)
 Informed consent obtained  Sterile technique  Given morphine  4 mg IV  Local anesthesia with 5 ml 1% lidocaine   64F pigtail catheter placed right pleural space using modified Seldinger technique  + air  Tolerated extremely well  Repeat CXR pending  [name], MD [facility] [phone]

## 2023-06-01 NOTE — Plan of Care (Signed)

## 2023-06-01 NOTE — Procedures (Signed)
 Extubation Procedure Note  Patient Details:   Name: Reginald [name] DOB: 1965-09-12 MRN: [id number]   Airway Documentation:  Airway 8 mm (Active)  Secured at (cm) 23 cm 05/31/23 2348  Measured From Lips 05/31/23 2348  Secured Location Left 05/31/23 2348  Secured By [employer] 05/31/23 2348  Bite Block No 05/31/23 2348  Tube Holder Repositioned Yes 05/31/23 2348  Prone position No 05/31/23 1945  Cuff Pressure (cm H2O) Clear OR 27-39 CmH2O 05/31/23 2348  Site Condition Dry 05/31/23 2348   Vent end date: (not recorded) Vent end time: (not recorded)   Evaluation  O2 sats: stable throughout Complications: No apparent complications Patient did tolerate procedure well. Bilateral Breath Sounds: Clear, Diminished   Yes Pt extubated per rapid wean protocol. NIF -30, VC 850, ABG within normal limits, + cuff leak, patient able to speak and is A&Ox4. No stridor noted.  Clydia Dart 06/01/2023, 12:46 AM

## 2023-06-02 ENCOUNTER — Inpatient Hospital Stay (HOSPITAL_COMMUNITY)

## 2023-06-02 LAB — GLUCOSE, CAPILLARY
Glucose-Capillary: 139 mg/dL — ABNORMAL HIGH (ref 70–99)
Glucose-Capillary: 127 mg/dL — ABNORMAL HIGH (ref 70–99)
Glucose-Capillary: 132 mg/dL — ABNORMAL HIGH (ref 70–99)
Glucose-Capillary: 104 mg/dL — ABNORMAL HIGH (ref 70–99)
Glucose-Capillary: 118 mg/dL — ABNORMAL HIGH (ref 70–99)

## 2023-06-02 LAB — BASIC METABOLIC PANEL WITH GFR
Anion gap: 9 (ref 5–15)
BUN: 15 mg/dL (ref 6–20)
CO2: 25 mmol/L (ref 22–32)
Calcium: 8.3 mg/dL — ABNORMAL LOW (ref 8.9–10.3)
Chloride: 104 mmol/L (ref 98–111)
Creatinine, Ser: 1.02 mg/dL (ref 0.61–1.24)
GFR, Estimated: >60 mL/min (ref >60)
Glucose, Bld: 129 mg/dL — ABNORMAL HIGH (ref 70–99)
Potassium: 4.3 mmol/L (ref 3.5–5.1)
Sodium: 138 mmol/L (ref 135–145)

## 2023-06-02 LAB — CBC
HCT: 32.6 % — ABNORMAL LOW (ref 39.0–52.0)
Hemoglobin: 11 g/dL — ABNORMAL LOW (ref 13.0–17.0)
MCH: 31.1 pg (ref 26.0–34.0)
MCHC: 33.7 g/dL (ref 30.0–36.0)
MCV: 92.1 fL (ref 80.0–100.0)
Platelets: 115 10*3/uL — ABNORMAL LOW (ref 150–400)
RBC: 3.54 MIL/uL — ABNORMAL LOW (ref 4.22–5.81)
RDW: 12.7 % (ref 11.5–15.5)
WBC: 16.3 10*3/uL — ABNORMAL HIGH (ref 4.0–10.5)
nRBC: 0 % (ref 0.0–0.2)

## 2023-06-02 LAB — MAGNESIUM: Magnesium: 1.7 mg/dL (ref 1.7–2.4)

## 2023-06-02 MED ORDER — ~~LOC~~ CARDIAC SURGERY, PATIENT & FAMILY EDUCATION: Freq: Once | Status: DC

## 2023-06-02 MED ORDER — ALUM & MAG HYDROXIDE-SIMETH 200-200-20 MG/5ML PO SUSP: 15.0000 mL | Freq: Four times a day (QID) | ORAL | Status: DC | PRN

## 2023-06-02 MED ORDER — POTASSIUM CHLORIDE CRYS ER 20 MEQ PO TBCR
20.0000 meq | EXTENDED_RELEASE_TABLET | Freq: Every day | ORAL | Status: DC
  Administered 2023-06-02 – 2023-06-04 (×2): 20 meq via ORAL
  Filled 2023-06-02: qty 2
  Filled 2023-06-02: qty 1
  Filled 2023-06-02 (×2): qty 2

## 2023-06-02 MED ORDER — FUROSEMIDE 40 MG PO TABS
40.0000 mg | ORAL_TABLET | Freq: Every day | ORAL | Status: DC
  Administered 2023-06-04: 40 mg via ORAL
  Filled 2023-06-02: qty 1

## 2023-06-02 MED ORDER — MAGNESIUM OXIDE -MG SUPPLEMENT 400 (240 MG) MG PO TABS
400.0000 mg | ORAL_TABLET | Freq: Every day | ORAL | Status: DC
  Administered 2023-06-02 – 2023-06-06 (×4): 400 mg via ORAL
  Filled 2023-06-02 (×4): qty 1

## 2023-06-02 MED ORDER — ZOLPIDEM TARTRATE 5 MG PO TABS: 5.0000 mg | ORAL_TABLET | Freq: Every evening | ORAL | Status: DC | PRN

## 2023-06-02 MED ORDER — MAGNESIUM HYDROXIDE 400 MG/5ML PO SUSP: 30.0000 mL | Freq: Every day | ORAL | Status: DC | PRN

## 2023-06-02 MED ORDER — SODIUM CHLORIDE 0.9 % IV SOLN: 250.0000 mL | INTRAVENOUS | Status: AC | PRN

## 2023-06-02 MED ORDER — SODIUM CHLORIDE 0.9% FLUSH
3.0000 mL | Freq: Two times a day (BID) | INTRAVENOUS | Status: DC
  Administered 2023-06-02 – 2023-06-05 (×6): 3 mL via INTRAVENOUS

## 2023-06-02 MED ORDER — LISINOPRIL 10 MG PO TABS
10.0000 mg | ORAL_TABLET | Freq: Every day | ORAL | Status: DC
  Administered 2023-06-02: 10 mg via ORAL
  Filled 2023-06-02: qty 1

## 2023-06-02 MED ORDER — SODIUM CHLORIDE 0.9% FLUSH: 3.0000 mL | INTRAVENOUS | Status: DC | PRN

## 2023-06-02 MED ORDER — INSULIN ASPART 100 UNIT/ML IJ SOLN
0.0000 [IU] | Freq: Three times a day (TID) | INTRAMUSCULAR | Status: DC
  Administered 2023-06-02 – 2023-06-04 (×3): 2 [IU] via SUBCUTANEOUS

## 2023-06-02 NOTE — Progress Notes (Signed)
 Pt received from [facility], he is alert and oriented, V/S obtained, CCMD notified, CHG bath given, all needs met, call bell in reach, family at bedside  06/02/23 1449  Vitals  Temp 98.3 F (36.8 C)  Temp Source Oral  BP (!) 147/99  MAP (mmHg) 113  BP Location Right Arm  BP Method Automatic  Patient Position (if appropriate) Lying  Pulse Rate 74  Pulse Rate Source Monitor  ECG Heart Rate 78  Resp 20  Level of Consciousness  Level of Consciousness Alert  MEWS COLOR  MEWS Score Color Green  Oxygen Therapy  SpO2 100 %  O2 Device Nasal Cannula  O2 Flow Rate (L/min) 2 L/min  Pain Assessment  Pain Scale 0-10  Pain Score 0  MEWS Score  MEWS Temp 0  MEWS Systolic 0  MEWS Pulse 0  MEWS RR 0  MEWS LOC 0  MEWS Score 0

## 2023-06-02 NOTE — Progress Notes (Signed)
 Pt continues to complain of pain 10/10 to right wrist where  a-line was at.  Ulnar and radial pulses palpable and dopplerable,  no swelling noted, lidocaine  patch, prn morphine  given. Pt able to move arm and hand  slowly.

## 2023-06-02 NOTE — Progress Notes (Signed)
 2 Days Post-Op Procedure(s) (LRB): CORONARY ARTERY BYPASS GRAFTING (CABG) TIMES THREE USING LEFT INTERNAL MAMMARY ARTERY AND OPENLY HARVESTED RIGHT AND LEFT GREATER SAPHENOUS VEIN (N/A) ECHOCARDIOGRAM, TRANSESOPHAGEAL, INTRAOPERATIVE (N/A) Subjective: Some pain at chest incision but mostly right wrist  Objective: Vital signs in last 24 hours: Temp:  [98.6 F (37 C)-100.2 F (37.9 C)] 99.1 F (37.3 C) (05/11 0500) Pulse Rate:  [68-103] 82 (05/11 0630) Cardiac Rhythm: Normal sinus rhythm (05/10 2100) Resp:  [8-29] 18 (05/11 0630) BP: (129-180)/(87-115) 155/97 (05/11 0630) SpO2:  [70 %-100 %] 90 % (05/11 0630) Weight:  [58.7 kg] 58.7 kg (05/11 0641)  Hemodynamic parameters for last 24 hours: CVP:  [6 mmHg] 6 mmHg  Intake/Output from previous day: 05/10 0701 - 05/11 0700 In: 1228.7 [P.O.:850; I.V.:141; IV Piggyback:237.8] Out: 1940 [Urine:1810; Chest Tube:130] Intake/Output this shift: No intake/output data recorded.  General appearance: alert, cooperative, and no distress Neurologic: intact Heart: regular rate and rhythm Lungs: diminished breath sounds bibasilar Abdomen: normal findings: soft, non-tender Extremities: Right hand NV intact, mild swelling  Lab Results: Recent Labs    06/01/23 0421 06/02/23 0430  WBC 13.1* 16.3*  HGB 11.0* 11.0*  HCT 33.3* 32.6*  PLT 119* 115*   BMET:  Recent Labs    06/01/23 0421 06/02/23 0430  NA 140 138  K 3.9 4.3  CL 109 104  CO2 22 25  GLUCOSE 137* 129*  BUN 11 15  CREATININE 0.95 1.02  CALCIUM  7.6* 8.3*    PT/INR:  Recent Labs    05/31/23 2020  LABPROT 18.7*  INR 1.5*   ABG    Component Value Date/Time   PHART 7.384 06/01/2023 0035   HCO3 21.1 06/01/2023 0035   TCO2 22 06/01/2023 0035   ACIDBASEDEF 3.0 (H) 06/01/2023 0035   O2SAT 95 06/01/2023 0035   CBG (last 3)  Recent Labs    06/01/23 1532 06/01/23 1707 06/02/23 0329  GLUCAP 147* 152* 139*    Assessment/Plan: S/P Procedure(s) (LRB): CORONARY  ARTERY BYPASS GRAFTING (CABG) TIMES THREE USING LEFT INTERNAL MAMMARY ARTERY AND OPENLY HARVESTED RIGHT AND LEFT GREATER SAPHENOUS VEIN (N/A) ECHOCARDIOGRAM, TRANSESOPHAGEAL, INTRAOPERATIVE (N/A) Plan for transfer to step-down: see transfer orders POD # 2 NEURO- intact CV- in SR   Hypertension- on metoprolol , will add lisinopril  ASA, add Plavix in 24-48 hours  Rosuvastatin   Dc pacing wires RESP- on 2L [place] COPD- nebs PRN Right pneumothorax- still has air leak  Improved right lung expansion with residual pneumo at base RENAL- creatinine and lytes Ok  PO Lasix ENDO- CBG mildly elevated  Change SSI to AC and HS GI- tolerating diet but poor appetite Thrombocytopenia- mild, monitor Dc central line and Foley Continue cardiac rehab Transfer to 4E   LOS: 5 days    Reginald [name] 06/02/2023

## 2023-06-03 ENCOUNTER — Inpatient Hospital Stay (HOSPITAL_COMMUNITY)

## 2023-06-03 ENCOUNTER — Other Ambulatory Visit: Payer: Self-pay

## 2023-06-03 ENCOUNTER — Encounter (HOSPITAL_COMMUNITY): Payer: Self-pay | Admitting: Thoracic Surgery (Cardiothoracic Vascular Surgery)

## 2023-06-03 DIAGNOSIS — I200 Unstable angina: Secondary | ICD-10-CM | POA: Diagnosis not present

## 2023-06-03 DIAGNOSIS — M7989 Other specified soft tissue disorders: Secondary | ICD-10-CM

## 2023-06-03 DIAGNOSIS — Z951 Presence of aortocoronary bypass graft: Secondary | ICD-10-CM

## 2023-06-03 DIAGNOSIS — I97631 Postprocedural hematoma of a circulatory system organ or structure following cardiac bypass: Secondary | ICD-10-CM

## 2023-06-03 DIAGNOSIS — I9789 Other postprocedural complications and disorders of the circulatory system, not elsewhere classified: Secondary | ICD-10-CM

## 2023-06-03 LAB — BASIC METABOLIC PANEL WITH GFR
Anion gap: 9 (ref 5–15)
BUN: 16 mg/dL (ref 6–20)
CO2: 27 mmol/L (ref 22–32)
Calcium: 8.3 mg/dL — ABNORMAL LOW (ref 8.9–10.3)
Chloride: 99 mmol/L (ref 98–111)
Creatinine, Ser: 0.83 mg/dL (ref 0.61–1.24)
GFR, Estimated: >60 mL/min (ref >60)
Glucose, Bld: 134 mg/dL — ABNORMAL HIGH (ref 70–99)
Potassium: 3.9 mmol/L (ref 3.5–5.1)
Sodium: 135 mmol/L (ref 135–145)

## 2023-06-03 LAB — CBC
HCT: 30.6 % — ABNORMAL LOW (ref 39.0–52.0)
Hemoglobin: 10.5 g/dL — ABNORMAL LOW (ref 13.0–17.0)
MCH: 31.3 pg (ref 26.0–34.0)
MCHC: 34.3 g/dL (ref 30.0–36.0)
MCV: 91.1 fL (ref 80.0–100.0)
Platelets: 136 10*3/uL — ABNORMAL LOW (ref 150–400)
RBC: 3.36 MIL/uL — ABNORMAL LOW (ref 4.22–5.81)
RDW: 12.5 % (ref 11.5–15.5)
WBC: 14.6 10*3/uL — ABNORMAL HIGH (ref 4.0–10.5)
nRBC: 0 % (ref 0.0–0.2)

## 2023-06-03 LAB — GLUCOSE, CAPILLARY
Glucose-Capillary: 127 mg/dL — ABNORMAL HIGH (ref 70–99)
Glucose-Capillary: 145 mg/dL — ABNORMAL HIGH (ref 70–99)
Glucose-Capillary: 124 mg/dL — ABNORMAL HIGH (ref 70–99)
Glucose-Capillary: 129 mg/dL — ABNORMAL HIGH (ref 70–99)

## 2023-06-03 MED ORDER — EMPAGLIFLOZIN 10 MG PO TABS
10.0000 mg | ORAL_TABLET | Freq: Every day | ORAL | Status: DC
  Administered 2023-06-04 – 2023-06-06 (×3): 10 mg via ORAL
  Filled 2023-06-03 (×3): qty 1

## 2023-06-03 MED ORDER — TRAMADOL HCL 50 MG PO TABS
50.0000 mg | ORAL_TABLET | ORAL | Status: DC | PRN
  Administered 2023-06-06: 50 mg via ORAL
  Filled 2023-06-03: qty 1

## 2023-06-03 MED ORDER — PROCHLORPERAZINE EDISYLATE 10 MG/2ML IJ SOLN
10.0000 mg | Freq: Four times a day (QID) | INTRAMUSCULAR | Status: DC | PRN
  Administered 2023-06-03 (×2): 10 mg via INTRAVENOUS
  Filled 2023-06-03 (×2): qty 2

## 2023-06-03 MED ORDER — LOSARTAN POTASSIUM 25 MG PO TABS
25.0000 mg | ORAL_TABLET | Freq: Every day | ORAL | Status: DC
  Administered 2023-06-04 – 2023-06-06 (×3): 25 mg via ORAL
  Filled 2023-06-03 (×3): qty 1

## 2023-06-03 MED ORDER — GABAPENTIN 300 MG PO CAPS
300.0000 mg | ORAL_CAPSULE | Freq: Three times a day (TID) | ORAL | Status: DC
  Administered 2023-06-03 – 2023-06-05 (×5): 300 mg via ORAL
  Filled 2023-06-03 (×5): qty 1

## 2023-06-03 MED ORDER — METOPROLOL SUCCINATE ER 25 MG PO TB24
25.0000 mg | ORAL_TABLET | Freq: Every day | ORAL | Status: DC
  Administered 2023-06-03: 25 mg via ORAL
  Filled 2023-06-03: qty 1

## 2023-06-03 MED ORDER — TRAMADOL HCL 50 MG PO TABS: 50.0000 mg | ORAL_TABLET | Freq: Four times a day (QID) | ORAL | Status: DC | PRN

## 2023-06-03 MED ORDER — MORPHINE SULFATE (PF) 2 MG/ML IV SOLN
1.0000 mg | INTRAVENOUS | Status: DC | PRN
  Administered 2023-06-03: 4 mg via INTRAVENOUS
  Filled 2023-06-03: qty 2

## 2023-06-03 MED FILL — Heparin Sodium (Porcine) Inj 1000 Unit/ML: INTRAMUSCULAR | Qty: 10 | Status: AC

## 2023-06-03 MED FILL — Electrolyte-R (PH 7.4) Solution: INTRAVENOUS | Qty: 4000 | Status: AC

## 2023-06-03 MED FILL — Lidocaine HCl Local Soln Prefilled Syringe 100 MG/5ML (2%): INTRAMUSCULAR | Qty: 5 | Status: AC

## 2023-06-03 MED FILL — Mannitol IV Soln 20%: INTRAVENOUS | Qty: 500 | Status: AC

## 2023-06-03 MED FILL — Sodium Bicarbonate IV Soln 8.4%: INTRAVENOUS | Qty: 50 | Status: AC

## 2023-06-03 MED FILL — Sodium Chloride IV Soln 0.9%: INTRAVENOUS | Qty: 2000 | Status: AC

## 2023-06-03 NOTE — Progress Notes (Signed)
 CARDIAC REHAB PHASE I   Pt resting in bed, complaints of right wrist, right chest tube pain and nausea. Pt declined walk for now due to nausea and pain. Encouraged mobility and IS as tolerated. Provided pt with variety of light snacks option to assist with po intake. Will continue to follow.  1030-1100 Ronny Colas, RN BSN 06/03/2023 11:04 AM

## 2023-06-03 NOTE — Consult Note (Addendum)
 Hospital Consult    Reason for Consult:  severe right wrist pain Requesting Physician:  Dr. Luna Salinas MRN #:  [id number]  History of Present Illness: This is a 58 y.o. male s/p CABG x3  05/31/23 by Dr. Luna Salinas with 10/10 right wrist pain x 2 days following radial a- line placement. Patient explains that pain has been waxing and waning but keeping him up at night throbbing. Pain is mostly in forearm. Denies any loss of motor or sensation in right hand. He does feel that the pain is improving. 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 Vascular surgery was consulted for further evaluation.   Past Medical History:  Diagnosis Date   Arthritis    GERD (gastroesophageal reflux disease)    Headache    migraines    IBS (irritable bowel syndrome)     Past Surgical History:  Procedure Laterality Date   BIOPSY  11/17/2021   Procedure: BIOPSY;  Surgeon: Urban Garden, MD;  [facility]: [facility];  Service: Gastroenterology;;   COLONOSCOPY WITH PROPOFOL  N/A 11/17/2021   Procedure: COLONOSCOPY WITH PROPOFOL ;  Surgeon: Urban Garden, MD;  [facility]: [facility];  Service: Gastroenterology;  Laterality: N/A;  200 ASA 2   CORONARY ARTERY BYPASS GRAFT N/A 05/31/2023   Procedure: CORONARY ARTERY BYPASS GRAFTING (CABG) TIMES THREE USING LEFT INTERNAL MAMMARY ARTERY AND OPENLY HARVESTED RIGHT AND LEFT GREATER SAPHENOUS VEIN;  Surgeon: [name], MD;  Location: [facility] OR;  Service: Open Heart Surgery;  Laterality: N/A;   ESOPHAGOGASTRODUODENOSCOPY N/A 04/24/2023   Procedure: EGD (ESOPHAGOGASTRODUODENOSCOPY);  Surgeon: Umberto Ganong, Bearl Limes, MD;  [facility]: [facility];  Service: Gastroenterology;  Laterality: N/A;  9:15AM;ASA 2   ESOPHAGOGASTRODUODENOSCOPY (EGD) WITH PROPOFOL  N/A 11/17/2021   Procedure: ESOPHAGOGASTRODUODENOSCOPY (EGD) WITH PROPOFOL ;  Surgeon: Urban Garden, MD;  [facility]: [facility];  Service:  Gastroenterology;  Laterality: N/A;   INGUINAL HERNIA REPAIR Right 06/02/2015   Procedure: RIGHT INGUINAL HERNIA REPAIR WITH MESH;  Surgeon: Aldean Hummingbird, MD;  Location: [facility] OR;  Service: General;  Laterality: Right;   INSERTION OF MESH Right 06/02/2015   Procedure: INSERTION OF MESH;  Surgeon: Aldean Hummingbird, MD;  Location: [facility] OR;  Service: General;  Laterality: Right;   INTRAOPERATIVE TRANSESOPHAGEAL ECHOCARDIOGRAM N/A 05/31/2023   Procedure: ECHOCARDIOGRAM, TRANSESOPHAGEAL, INTRAOPERATIVE;  Surgeon: [name], MD;  Location: [facility] OR;  Service: Open Heart Surgery;  Laterality: N/A;   LEFT HEART CATH AND CORONARY ANGIOGRAPHY N/A 05/29/2023   Procedure: LEFT HEART CATH AND CORONARY ANGIOGRAPHY;  Surgeon: Swaziland, Peter M, MD;  Location: [facility] INVASIVE CV LAB;  Service: Cardiovascular;  Laterality: N/A;   NO PAST SURGERIES     POLYPECTOMY  11/17/2021   Procedure: POLYPECTOMY;  Surgeon: Urban Garden, MD;  [facility]: [facility];  Service: Gastroenterology;;    Allergies  Allergen Reactions   Penicillins Rash    PCN: Immediate rash, facial/tongue/throat swelling, SOB or lightheadedness with hypotension.   (He has received Ceftriaxone  IM on 06/10/2017 without adverse reaction /none reported).     Prior to Admission medications   Medication Sig Start Date End Date Taking? Authorizing Provider  dicyclomine  (BENTYL ) 10 MG capsule TAKE 1 CAPSULE (10 MG TOTAL) BY MOUTH EVERY 12 (TWELVE) HOURS AS NEEDED (ABDOMINAL PAIN). 02/21/23  Yes Urban Garden, MD  diphenoxylate -atropine  (LOMOTIL ) 2.5-0.025 MG tablet TAKE 1 TABLET BY MOUTH 4 (FOUR) TIMES DAILY AS NEEDED FOR DIARRHEA OR LOOSE STOOLS. 05/15/23  Yes Carlan, Fidel Huddle, NP  MAGNESIUM   PO Take 1 tablet by mouth at bedtime.   Yes [provider]  Multiple Vitamin (MULTIVITAMIN) tablet Take 1 tablet by mouth at bedtime.   Yes [provider]  omeprazole  (PRILOSEC) 40 MG capsule Take 1 capsule (40 mg total) by mouth  daily. 04/24/23  Yes Urban Garden, MD  ondansetron  (ZOFRAN ) 4 MG tablet TAKE 1 TABLET BY MOUTH EVERY 8 HOURS AS NEEDED FOR NAUSEA AND VOMITING 01/25/23  Yes Carlan, Chelsea L, NP  promethazine  (PHENERGAN ) 12.5 MG tablet TAKE 1 TABLET BY MOUTH EVERY 6 HOURS AS NEEDED FOR NAUSEA OR VOMITING. 05/27/23  Yes Carlan, Chelsea L, NP  sucralfate  (CARAFATE ) 1 g tablet Take 1 tablet (1 g total) by mouth 4 (four) times daily -  with meals and at bedtime. Dissolve tablet in 30ml of water, then drink slurry 04/02/23  Yes Carlan, Chelsea L, NP  traMADol  (ULTRAM ) 50 MG tablet Take 50 mg by mouth every 6 (six) hours as needed for moderate pain (pain score 4-6). 03/21/23  Yes [provider]    Social History   Socioeconomic History   Marital status: Significant Other    Spouse name: Not on file   Number of children: Not on file   Years of education: Not on file   Highest education level: Not on file  Occupational History   Not on file  Tobacco Use   Smoking status: Heavy Smoker    Current packs/day: 1.50    Types: Cigarettes    Passive exposure: Current   Smokeless tobacco: Former  Building services engineer status: Never Used  Substance and Sexual Activity   Alcohol use: No    Alcohol/week: 0.0 standard drinks of alcohol    Comment: formerly a heavy drinker, quit 2002   Drug use: No   Sexual activity: Not on file  Other Topics Concern   Not on file  Social History Narrative   Not on file   Social Drivers of Health   Financial Resource Strain: Low Risk  (03/20/2023)   Received from [facility]   Overall Financial Resource Strain (CARDIA)    Difficulty of Paying Living Expenses: Not hard at all  Food Insecurity: Patient Declined (05/28/2023)   Hunger Vital Sign    Worried About Running Out of Food in the Last Year: Patient declined    Ran Out of Food in the Last Year: Patient declined  Transportation Needs: No Transportation Needs (05/28/2023)   PRAPARE - Scientist, research (physical sciences) (Medical): No    Lack of Transportation (Non-Medical): No  Physical Activity: Sufficiently Active (03/20/2023)   Received from [facility]   Exercise Vital Sign    Days of Exercise per Week: 7 days    Minutes of Exercise per Session: 30 min  Stress: No Stress Concern Present (03/20/2023)   Received from [facility] of Occupational Health - Occupational Stress Questionnaire    Feeling of Stress : Only a little  Social Connections: Moderately Integrated (03/20/2023)   Received from [facility]   Social Connection and Isolation Panel [NHANES]    Frequency of Communication with Friends and Family: More than three times a week    Frequency of Social Gatherings with Friends and Family: Never    Attends Religious Services: More than 4 times per year    Active Member of [employer] or Organizations: No    Attends Banker Meetings: Never    Marital Status:  Living with partner  Intimate Partner Violence: Not At Risk (05/28/2023)   Humiliation, Afraid, Rape, and Kick questionnaire    Fear of Current or Ex-Partner: No    Emotionally Abused: No    Physically Abused: No    Sexually Abused: No     Family History  Problem Relation Age of Onset   Diabetes Mother    Osteoarthritis Mother    Heart disease Father     ROS: Otherwise negative unless mentioned in HPI  Physical Examination  Vitals:   06/03/23 1221 06/03/23 1222  BP: 122/86   Pulse: 97 93  Resp: (!) 27 20  Temp: (!) 97.5 F (36.4 C)   SpO2: 96% 97%   Body mass index is 19.54 kg/m.  General:  WDWN in NAD Gait: Not observed HENT: WNL, normocephalic Pulmonary: normal non-labored breathing Cardiac: regular Vascular Exam/Pulses: 2+ brachial pulse, Doppler Ulnar signal and palmar arch signals, no doppler radial signal or pulse, forearm is soft, mild swelling and ecchymosis present. Motor and sensation intact, hand is warm Musculoskeletal: no muscle wasting or  atrophy  Neurologic: A&O X 3;  No focal weakness or paresthesias are detected; speech is fluent/normal Psychiatric:  The pt has Normal affect.   CBC    Component Value Date/Time   WBC 14.6 (H) 06/03/2023 0425   RBC 3.36 (L) 06/03/2023 0425   HGB 10.5 (L) 06/03/2023 0425   HCT 30.6 (L) 06/03/2023 0425   PLT 136 (L) 06/03/2023 0425   MCV 91.1 06/03/2023 0425   MCH 31.3 06/03/2023 0425   MCHC 34.3 06/03/2023 0425   RDW 12.5 06/03/2023 0425   LYMPHSABS 2.5 05/28/2023 1418   MONOABS 0.6 05/28/2023 1418   EOSABS 0.2 05/28/2023 1418   BASOSABS 0.1 05/28/2023 1418    BMET    Component Value Date/Time   NA 135 06/03/2023 0425   K 3.9 06/03/2023 0425   CL 99 06/03/2023 0425   CO2 27 06/03/2023 0425   GLUCOSE 134 (H) 06/03/2023 0425   BUN 16 06/03/2023 0425   CREATININE 0.83 06/03/2023 0425   CALCIUM  8.3 (L) 06/03/2023 0425   GFRNONAA >60 06/03/2023 0425    COAGS: Lab Results  Component Value Date   INR 1.5 (H) 05/31/2023   INR 1.0 05/28/2023     Non-Invasive Vascular Imaging:   VAS Upper Ext. Arterial duplex: radial artery occluded with thrombosed pseudoaneurysm. Hematoma present. Ulnar artery patent. Final read pending  VAS US  Upper Ext. Venous Duplex: no DVT. Final read pending  Statin:  Yes.   Beta Blocker:  Yes.   Aspirin :  Yes.   ACEI:  No. ARB:  Yes.   CCB use:  No Other antiplatelets/anticoagulants:  Yes.   Plavix   ASSESSMENT/PLAN: This is a 58 y.o. male s/p CABG x 3 with radial artery occlusion after a line placement. Small hematoma in right forearm but soft. Right hand is well perfused with brisk ulnar and palmar signals. Motor and sensation intact. Per patient his pain is slowly improving.  Continue elevation and hot/ cold compresses as needed for comfort. No indication for intervention at this time. Patient seen with Dr. Rosalva Comber who agrees with plan to continue observation. Vascular will remain available as needed if any clinical changes.   Deneen Finical PA-C [facility] [phone] 06/03/2023  4:05 PM  VASCULAR STAFF ADDENDUM: I have independently interviewed and examined the patient. I agree with the above.  No signs of compartment syndrome.  Small hematoma in the right forearm, however  the forearm is soft.  Well-perfused hand with brisk ulnar and palmar signals.  These are multiphasic.  No need for radial revascularization or hematoma evacuation his symptoms continue to improve. Please call should any questions or concerns arise.  Kayla Part MD [facility] Phone Number: [phone] 06/03/2023 5:14 PM

## 2023-06-03 NOTE — Progress Notes (Addendum)
 [address]       [place] [address]             [phone]      3 Days Post-Op Procedure(s) (LRB): CORONARY ARTERY BYPASS GRAFTING (CABG) TIMES THREE USING LEFT INTERNAL MAMMARY ARTERY AND OPENLY HARVESTED RIGHT AND LEFT GREATER SAPHENOUS VEIN (N/A) ECHOCARDIOGRAM, TRANSESOPHAGEAL, INTRAOPERATIVE (N/A) Subjective: Patient continues to complain of 10/10 right wrist pain and nausea which he thinks is caused by the pain. He was able to have a BM this AM.   Objective: Vital signs in last 24 hours: Temp:  [97.8 F (36.6 C)-98.7 F (37.1 C)] 98.4 F (36.9 C) (05/12 0326) Pulse Rate:  [74-99] 99 (05/12 0326) Cardiac Rhythm: Normal sinus rhythm (05/11 1943) Resp:  [12-27] 18 (05/12 0326) BP: (100-167)/(85-104) 138/98 (05/12 0326) SpO2:  [92 %-100 %] 95 % (05/12 0326) Weight:  [58.3 kg] 58.3 kg (05/12 0326)  Hemodynamic parameters for last 24 hours:    Intake/Output from previous day: 05/11 0701 - 05/12 0700 In: 120 [P.O.:120] Out: 1624 [Urine:1550; Chest Tube:74] Intake/Output this shift: No intake/output data recorded.  General appearance: alert, cooperative, and no distress Neurologic: intact Heart: regular rate and rhythm, S1, S2 normal, no murmur, click, rub or gallop Lungs: diminished right basilar breath sounds Abdomen: soft, non-tender; bowel sounds normal; no masses,  no organomegaly Extremities: extremities normal, atraumatic, no cyanosis or edema Wound: staples in place on lower extremity wounds, minimal bleeding on dressings, no sign of infection, right wrist with minimal swelling and bruising, right hand is neurovascularly intact but tender to palpation, sternal incision is clean and dry without sign of infection  Lab Results: Recent Labs    06/02/23 0430 06/03/23 0425  WBC 16.3* 14.6*  HGB 11.0* 10.5*  HCT 32.6* 30.6*  PLT 115* 136*   BMET:  Recent Labs    06/02/23 0430 06/03/23 0425  NA 138 135  K 4.3 3.9  CL 104 99  CO2  25 27  GLUCOSE 129* 134*  BUN 15 16  CREATININE 1.02 0.83  CALCIUM  8.3* 8.3*    PT/INR:  Recent Labs    05/31/23 2020  LABPROT 18.7*  INR 1.5*   ABG    Component Value Date/Time   PHART 7.384 06/01/2023 0035   HCO3 21.1 06/01/2023 0035   TCO2 22 06/01/2023 0035   ACIDBASEDEF 3.0 (H) 06/01/2023 0035   O2SAT 95 06/01/2023 0035   CBG (last 3)  Recent Labs    06/02/23 1554 06/02/23 2133 06/03/23 0604  GLUCAP 104* 118* 127*    Assessment/Plan: S/P Procedure(s) (LRB): CORONARY ARTERY BYPASS GRAFTING (CABG) TIMES THREE USING LEFT INTERNAL MAMMARY ARTERY AND OPENLY HARVESTED RIGHT AND LEFT GREATER SAPHENOUS VEIN (N/A) ECHOCARDIOGRAM, TRANSESOPHAGEAL, INTRAOPERATIVE (N/A)  Neuro: Patient continues to complain of right wrist pain. Receiving morphine  around the clock because he states oxycodone  makes him nauseous. Will add Tramadol  and Gabapentin to see if this helps with possible nerve injury at wrist.   CV: HTN better controlled, SBP mostly 130s-140s on Lopressor  25mg  BID and Lisinopril 10mg  daily. Will add Plavix today or tomorrow per Dr. Luna Salinas. NSR, HR 80s.   Pulm: Saturating well on 2L [place]. Right basilar pneumothorax remains stable, left basilar atelectasis improved. CT output 74cc/24hrs. CT with +air leak this AM. Will leave CT in place.   GI: +BM this AM. Nausea with mostly dry heaving. Not tolerating much of an appetite. States Oxycodone  makes him nauseous. Continue Zofran .  Endo: Preop A1C 5.4.  CBGs controlled on SSI.   Renal: Cr 0.83, improved from yesterday. UO 1550cc/24hrs. About at preop weight, will continue 1 more day of Lasix. K 3.9, supplement.   ID: Likely reactive leukocytosis trending down, afebrile. Clinically monitor.   Expected postop ABLA: H/H 10.5/30.6, stable. Likely reactive thrombocytopenia, plt 136,000. Overall stable. Not clinically significant at this time.   DVT Prophylaxis: Lovenox  Dispo: Work on pain control and nausea. Dispo  Planning.    LOS: 6 days    Randa Burton, PA-C 06/03/2023 Patient seen and examined, agree with findings and plan outlined above Still has air leak- trying CT to suction for 24 hours to see if we can get lung completely reexpanded. Biggest complaint remains pain and swelling right forearm.  He has sensation in right hand.  Ultrasound just completed- verbal report of radial occlusion and pseudoaneurysm. Will ask vascular to see  [name]. Luna Salinas, MD [facility] [phone]

## 2023-06-03 NOTE — Progress Notes (Signed)
 Rounding Note    Patient Name: Reginald [name] Date of Encounter: 06/03/2023  [facility] Cardiologist: Vishnu P Mallipeddi, MD   Subjective   POD3 3V CABG  Having back pain once chest tube was connected to suction this morning.  Also having right upper extremity pain which started yesterday.  An ultrasound is pending.  Inpatient Medications    Scheduled Meds:  acetaminophen   1,000 mg Oral Q6H   Or   acetaminophen  (TYLENOL ) oral liquid 160 mg/5 mL  1,000 mg Per Tube Q6H   aspirin  EC  325 mg Oral Daily   Or   aspirin   324 mg Per Tube Daily   bisacodyl  10 mg Oral Daily   Or   bisacodyl  10 mg Rectal Daily   chlorhexidine   15 mL Mouth/Throat BID   [facility] Patient & Family Education   Does not apply Once   enoxaparin (LOVENOX) injection  40 mg Subcutaneous QHS   furosemide  40 mg Oral Daily   gabapentin  300 mg Oral TID   insulin  aspart  0-15 Units Subcutaneous TID WC   lidocaine   1 patch Transdermal Q24H   lisinopril  10 mg Oral Daily   magnesium  oxide  400 mg Oral Daily   metoprolol  tartrate  25 mg Oral BID   Or   metoprolol  tartrate  25 mg Per Tube BID   nicotine   21 mg Transdermal Daily   pantoprazole   40 mg Oral Daily   potassium chloride  SA  20 mEq Oral Daily   rosuvastatin   20 mg Oral Daily   sodium chloride  flush  3 mL Intravenous Q12H   sucralfate   1 g Oral TID WC & HS   Continuous Infusions:  sodium chloride      PRN Meds: sodium chloride , alum & mag hydroxide-simeth, dicyclomine , diphenoxylate -atropine , ipratropium, levalbuterol , magnesium  hydroxide, metoprolol  tartrate, morphine  injection, nicotine  polacrilex, ondansetron  (ZOFRAN ) IV, oxyCODONE , prochlorperazine, sodium chloride  flush, traMADol , zolpidem   Vital Signs    Vitals:   06/02/23 1943 06/03/23 0027 06/03/23 0326 06/03/23 0814  BP: (!) 142/92 130/89 (!) 138/98 136/88  Pulse: 92 88 99 87  Resp: 18 18 18 16   Temp: 98.5 F (36.9 C) 98.6 F (37 C) 98.4 F (36.9  C) 98 F (36.7 C)  TempSrc: Oral Oral Oral Oral  SpO2: 92% 95% 95% 100%  Weight:   58.3 kg   Height:        Intake/Output Summary (Last 24 hours) at 06/03/2023 1135 Last data filed at 06/03/2023 0555 Gross per 24 hour  Intake 120 ml  Output 800 ml  Net -680 ml      06/03/2023    3:26 AM 06/02/2023    6:41 AM 06/01/2023    6:00 AM  Last 3 Weights  Weight (lbs) 128 lb 8.5 oz 129 lb 6.6 oz 132 lb 7.9 oz  Weight (kg) 58.3 kg 58.7 kg 60.1 kg      Telemetry    Sinus rhythm- Personally Reviewed  Physical Exam   GEN: No acute distress.   Neck: No JVD Cardiac: RRR, no murmurs, rubs, or gallops.  Respiratory: Clear to auscultation bilaterally.  CT in place GI: Soft, nontender, non-distended  MS: No edema; No deformity. Neuro:  Nonfocal  Psych: Normal affect   Labs    High Sensitivity Troponin:   Recent Labs  Lab 05/28/23 1418 05/28/23 1548 05/30/23 2317  TROPONINIHS 17 15 13      Chemistry Recent Labs  Lab 05/28/23 1418  05/30/23 0445 06/01/23 0346 06/01/23 0421 06/02/23 0430 06/03/23 0425  NA 136   < >  --  140 138 135  K 3.8   < >  --  3.9 4.3 3.9  CL 104   < >  --  109 104 99  CO2 24   < >  --  22 25 27   GLUCOSE 120*   < >  --  137* 129* 134*  BUN 12   < >  --  11 15 16   CREATININE 0.94   < >  --  0.95 1.02 0.83  CALCIUM  8.8*   < >  --  7.6* 8.3* 8.3*  MG  --   --  2.1  --  1.7  --   PROT 6.9  --   --   --   --   --   ALBUMIN 4.0  --   --   --   --   --   AST 17  --   --   --   --   --   ALT 10  --   --   --   --   --   ALKPHOS 78  --   --   --   --   --   BILITOT 0.4  --   --   --   --   --   GFRNONAA >60   < >  --  >60 >60 >60  ANIONGAP 8   < >  --  9 9 9    < > = values in this interval not displayed.    Lipids  Recent Labs  Lab 05/30/23 0445  CHOL 223*  TRIG 116  HDL 38*  LDLCALC 162*  CHOLHDL 5.9    Hematology Recent Labs  Lab 06/01/23 0421 06/02/23 0430 06/03/23 0425  WBC 13.1* 16.3* 14.6*  RBC 3.58* 3.54* 3.36*  HGB 11.0* 11.0*  10.5*  HCT 33.3* 32.6* 30.6*  MCV 93.0 92.1 91.1  MCH 30.7 31.1 31.3  MCHC 33.0 33.7 34.3  RDW 12.7 12.7 12.5  PLT 119* 115* 136*   Thyroid No results for input(s): "TSH", "FREET4" in the last 168 hours.  BNP Recent Labs  Lab 05/28/23 1418  BNP 175.0*    DDimer No results for input(s): "DDIMER" in the last 168 hours.   Radiology    DG Chest Port 1 View Result Date: 06/03/2023 CLINICAL DATA:  Right pneumothorax. EXAM: PORTABLE CHEST 1 VIEW COMPARISON:  Jun 02, 2023. FINDINGS: Stable cardiomediastinal silhouette. Sternotomy wires are noted. Right-sided chest tube is been reposition with distal tip seen more inferiorly in right lung base. Right basilar pneumothorax is not significantly changed. Mild amount of overlying subcutaneous emphysema is noted. Minimal left basilar subsegmental atelectasis with small pleural effusion is noted. IMPRESSION: Right-sided chest tube has been reposition with no significant change in right basilar pneumothorax. Left basilar opacity as noted above. Electronically Signed   By: Rosalene Colon M.D.   On: 06/03/2023 10:09   DG Chest Port 1 View Result Date: 06/02/2023 CLINICAL DATA:  Interval repositioning of pneumothorax. EXAM: PORTABLE CHEST 1 VIEW COMPARISON:  06/01/23 FINDINGS: There has been interval repositioning of the right-sided pigtail thoracostomy tube which now overlies right hilar region. There has been decreased volume of the right-sided pneumothorax particularly over the right upper and right mid lung with residual right basilar pneumothorax identified. Persistent opacity within the left base which may reflect atelectasis or airspace disease. IMPRESSION: 1. Interval repositioning of  right-sided pigtail thoracostomy tube with decreased volume of right-sided pneumothorax. Residual right basilar component of the pneumothorax noted on the current exam. 2. Persistent left base opacity which may reflect atelectasis or airspace disease. Electronically Signed    By: Kimberley Penman M.D.   On: 06/02/2023 09:53    Cardiac Studies   Cardiac catheterization, 05/29/2023   Ost LAD to Prox LAD lesion is 70% stenosed.   Mid LAD lesion is 80% stenosed.   1st Mrg lesion is 95% stenosed.   Prox Cx to Mid Cx lesion is 60% stenosed.   Prox RCA to Mid RCA lesion is 90% stenosed.   LV end diastolic pressure is mildly elevated.   3 vessel obstructive CAD Mildly elevated LVEDP   Plan: will resume IV heparin . CT surgery consultation.   Echocardiogram, 05/29/2023 IMPRESSIONS  Left ventricular ejection fraction, by estimation, is 35 to 40% . The left ventricle has moderately decreased function. The left ventricle demonstrates regional wall motion abnormalities ( see scoring diagram/ findings for description) . Left ventricular diastolic parameters are consistent with Grade I diastolic dysfunction ( impaired relaxation) .  Right ventricular systolic function is normal. The right ventricular size is normal. Tricuspid regurgitation signal is inadequate for assessing PA pressure.  The mitral valve is normal in structure. No evidence of mitral valve regurgitation. No evidence of mitral stenosis.  The aortic valve is normal in structure. Aortic valve regurgitation is not visualized. No aortic stenosis is present.  The inferior vena cava is normal in size with greater than 50% respiratory variability, suggesting right atrial pressure of 3 mmHg.  Patient Profile     ROHITH PICTON is a 58 y.o. male with past medical history of GERD, migraines, IBS, arthritis who presented to [facility] with intermittent chest pain. He was transferred to [facility] to undergo pulmonary consultation for incidental finding of lung nodule. He underwent LHC on 05/29/2023 that showed 3-vessel obstructive CAD and is awaiting CT surgery consult.  Assessment & Plan    Triple vessel obstructive CAD  -Cont ASA 81mg  -Cont crestor  20mg  -Start Toprol  25mg  QPM  Ischemic cardiomyopathy; TTE this  admissin EF 35% -Start Toprol  25mg  QPM -Start Jardiance 10mg  Qday -D/C lisonopril, start Losartan 25mg  >> consider conversion to Entresto as outpatient or on 5/15 -Plan on spironolactone tomorrow.   Hyperlipidemia  05/28/2023: ALT 10 05/30/2023: HDL 38; LDL Cholesterol 162  -Cont Crestor  20mg ; LPA not elevated   Hypertension -Start Toprol  and losartan as above  RUE pain -F/U u/s today.   Per primary Lung nodule Severe emphysema  Nicotine  abuse  IBS GERD  For questions or updates, please contact [facility] Please consult [email or web address] for contact info under        Signed, [employer] Katura Eatherly, MD  06/03/2023, 11:35 AM

## 2023-06-03 NOTE — Progress Notes (Signed)
 RUE venous and arterial duplex has been completed.  Prelminary findings given to [employer], PA-C.  Results can be found under chart review under CV PROC. 06/03/2023 5:59 PM Katrell Milhorn RVT, RDMS

## 2023-06-04 ENCOUNTER — Inpatient Hospital Stay (HOSPITAL_COMMUNITY)

## 2023-06-04 DIAGNOSIS — I200 Unstable angina: Secondary | ICD-10-CM | POA: Diagnosis not present

## 2023-06-04 LAB — GLUCOSE, CAPILLARY
Glucose-Capillary: 129 mg/dL — ABNORMAL HIGH (ref 70–99)
Glucose-Capillary: 110 mg/dL — ABNORMAL HIGH (ref 70–99)
Glucose-Capillary: 137 mg/dL — ABNORMAL HIGH (ref 70–99)
Glucose-Capillary: 106 mg/dL — ABNORMAL HIGH (ref 70–99)
Glucose-Capillary: 127 mg/dL — ABNORMAL HIGH (ref 70–99)

## 2023-06-04 MED ORDER — METOPROLOL SUCCINATE ER 50 MG PO TB24
50.0000 mg | ORAL_TABLET | Freq: Every day | ORAL | Status: DC
  Administered 2023-06-04 – 2023-06-05 (×2): 50 mg via ORAL
  Filled 2023-06-04 (×2): qty 1

## 2023-06-04 MED ORDER — SPIRONOLACTONE 12.5 MG HALF TABLET
12.5000 mg | ORAL_TABLET | Freq: Every day | ORAL | Status: DC
  Administered 2023-06-04 – 2023-06-06 (×3): 12.5 mg via ORAL
  Filled 2023-06-04 (×3): qty 1

## 2023-06-04 NOTE — Plan of Care (Signed)

## 2023-06-04 NOTE — Discharge Instructions (Signed)

## 2023-06-04 NOTE — Progress Notes (Addendum)
 Patient Name: Reginald [name] Date of Encounter: 06/04/2023 [facility] Cardiologist: Vishnu P Mallipeddi, MD   Interval Summary  .    Right arm pain improving. Getting up to the bathroom is getting easier. Was able to sleep last night.   Vital Signs .    Vitals:   06/03/23 1931 06/03/23 2319 06/04/23 0336 06/04/23 0500  BP: (!) 135/90 (!) 150/94 132/82   Pulse: 96 (!) 102 89   Resp: 18 18 18    Temp: 98.9 F (37.2 C) 98.3 F (36.8 C) (!) 97.4 F (36.3 C)   TempSrc: Oral Oral Oral   SpO2: 98% 98% 98%   Weight:    58.1 kg  Height:        Intake/Output Summary (Last 24 hours) at 06/04/2023 0754 Last data filed at 06/04/2023 0700 Gross per 24 hour  Intake --  Output 525 ml  Net -525 ml      06/04/2023    5:00 AM 06/03/2023    3:26 AM 06/02/2023    6:41 AM  Last 3 Weights  Weight (lbs) 128 lb 1.4 oz 128 lb 8.5 oz 129 lb 6.6 oz  Weight (kg) 58.1 kg 58.3 kg 58.7 kg      Telemetry/ECG    Sinus Rhythm, 80-90s - Personally Reviewed  Physical Exam .    GEN: No acute distress.   Neck: No JVD Cardiac: RRR, no murmurs, rubs, or gallops.  Respiratory: Slightly diminished in the right LL. Right CT in place GI: Soft, nontender, non-distended  MS: No edema Skin: dressings to bilateral legs with some old blood on the left side.  Assessment & Plan .     58 y.o. male with past medical history of GERD, migraines, IBS, arthritis who presented to [facility] with intermittent chest pain. He was transferred to [facility] to undergo pulmonary consultation for incidental finding of lung nodule. He underwent LHC on 05/29/2023 that showed 3-vessel obstructive CAD and underwent CABG   Unstable angina CAD s/p CABG  -- underwent 3V CABG on 5/9, overall progressing well -- on ASA, statin, Toprol . Plans for plavix prior to discharge.  ICM -- echo 5/7 showed LVEF of 35-40%, g1DD, normal RV, akinesis of the m/d lateral wall, posterior wall, and mid anterolateral wall --  GDMT: continue Toprol  25mg  at bedtime, jardiance, losartan with plans for eventual Entresto (possibly tomorrow). Add spiro 12.5mg  daily   HLD -- continue crestor  20mg  daily  Right wrist pain w/ radial artery occlusion -- seen by VVS yesterday with recommendations for conservative management, no plans for radial evacuation or hematoma evacuation   Other diagnoses per primary Lung Nodule-- needs additional work up as an outpatient  Severe Emphysema Tobacco use IBS GERD  For questions or updates, please contact [facility] Please consult [email or web address] for contact info under        Signed, Johnie Nailer, NP   ATTENDING ATTESTATION:  After conducting a review of all available clinical information with the care team, interviewing the patient, and performing a physical exam, I agree with the findings and plan described in this note.   GEN: No acute distress.   HEENT:  MMM, no JVD, no scleral icterus Cardiac: RRR, no murmurs, rubs, or gallops.  Respiratory: Clear to auscultation bilaterally. GI: Soft, nontender, non-distended  MS: No edema; No deformity. Neuro:  Nonfocal  Vasc:  +2 radial pulses  Patient much improved today with less pain in RUE >> conservative management of thrombosed PSA vs hematoma.  Breathing well and incisional pain is controlled.  Tolerating GDMT and agree with adding spironolactone today with planned conversion of losartan to [facility] tomorrow or on 5/15.  Alyssa Backbone, MD Pager [phone]

## 2023-06-04 NOTE — Progress Notes (Addendum)
 [address]       [place] [address]             [phone]      4 Days Post-Op Procedure(s) (LRB): CORONARY ARTERY BYPASS GRAFTING (CABG) TIMES THREE USING LEFT INTERNAL MAMMARY ARTERY AND OPENLY HARVESTED RIGHT AND LEFT GREATER SAPHENOUS VEIN (N/A) ECHOCARDIOGRAM, TRANSESOPHAGEAL, INTRAOPERATIVE (N/A) Subjective: Patient states his wrist pain is much better and he was able to get some sleep last night.  Objective: Vital signs in last 24 hours: Temp:  [97.4 F (36.3 C)-98.9 F (37.2 C)] 97.4 F (36.3 C) (05/13 0336) Pulse Rate:  [87-102] 89 (05/13 0336) Cardiac Rhythm: Normal sinus rhythm (05/12 1900) Resp:  [16-20] 18 (05/13 0336) BP: (122-150)/(82-94) 132/82 (05/13 0336) SpO2:  [96 %-100 %] 98 % (05/13 0336) Weight:  [58.1 kg] 58.1 kg (05/13 0500)  Hemodynamic parameters for last 24 hours:    Intake/Output from previous day: 05/12 0701 - 05/13 0700 In: -  Out: 350 [Urine:350] Intake/Output this shift: No intake/output data recorded.  General appearance: alert, cooperative, and no distress Neurologic: intact Heart: regular rate and rhythm, S1, S2 normal, no murmur, click, rub or gallop Lungs: slightly diminished right basilar breath sounds Abdomen: soft, non-tender; bowel sounds normal; no masses,  no organomegaly Extremities: extremities normal, atraumatic, no cyanosis or edema Wound: Clean and dry dressings in place on legs, lidocaine  in patch on wrist, right wrist and hand are neurovascularly intact, less tender to palpation today, sternal incision is clean and dry without sign of infection  Lab Results: Recent Labs    06/02/23 0430 06/03/23 0425  WBC 16.3* 14.6*  HGB 11.0* 10.5*  HCT 32.6* 30.6*  PLT 115* 136*   BMET:  Recent Labs    06/02/23 0430 06/03/23 0425  NA 138 135  K 4.3 3.9  CL 104 99  CO2 25 27  GLUCOSE 129* 134*  BUN 15 16  CREATININE 1.02 0.83  CALCIUM  8.3* 8.3*    PT/INR: No results for input(s):  "LABPROT", "INR" in the last 72 hours. ABG    Component Value Date/Time   PHART 7.384 06/01/2023 0035   HCO3 21.1 06/01/2023 0035   TCO2 22 06/01/2023 0035   ACIDBASEDEF 3.0 (H) 06/01/2023 0035   O2SAT 95 06/01/2023 0035   CBG (last 3)  Recent Labs    06/03/23 1637 06/03/23 2126 06/04/23 0619  GLUCAP 124* 129* 129*    Assessment/Plan: S/P Procedure(s) (LRB): CORONARY ARTERY BYPASS GRAFTING (CABG) TIMES THREE USING LEFT INTERNAL MAMMARY ARTERY AND OPENLY HARVESTED RIGHT AND LEFT GREATER SAPHENOUS VEIN (N/A) ECHOCARDIOGRAM, TRANSESOPHAGEAL, INTRAOPERATIVE (N/A)  Neuro: Patient's right wrist pain is much improved this AM. Will d/c morphine , continue Tylenol , Tramadol  PRN, Oxycodone  PRN and Gabapentin   CV: HTN ok control, SBP mostly 130s-140s, up to 150. On Toprol  XL 25mg  nightly and Cozaar 25mg  daily. Will add Plavix prior to discharge. NSR, HR 90s. Some ST elevation on monitor, will get EKG. Cardiology following and planning to add Spironolactone today.   Pulm: Saturating well on RA this AM. Right basilar pneumothorax improved. Minimal CT output. CT to suction with +air leak this AM. Will leave CT in place to suction for now.    GI: +BM. Nausea improved. Appetite improving. Continue Zofran  and Compazine PRN   Endo: Preop A1C 5.4. CBGs controlled on SSI.    Renal: Last Cr 0.83. UO 350cc/24hrs. At preop weight, will discontinue Lasix. Spironolactone to be started today per cardiology.  DVT Prophylaxis: Lovenox   Dispo: Chest tube to suction with + air leak, will leave in place to suction for now. Dispo planning   LOS: 7 days    Randa Burton, PA-C 06/04/2023  Patient seen and examined, agree with findings and plan outlined above Right arm pain much better Denies any chest pain.  ECG with lateral T wave inversion unchanged from 5/10 On water seal now with no air leak (had one earlier).  Lung better expanded with suction overnight.  Water seal today.  If lung stays up  will either convert to miniXpress or dc tube depending on whether or not there is a leak  [name], MD [facility] [phone]

## 2023-06-04 NOTE — Discharge Summary (Signed)
 [address]       [place] [address]             [phone]    Physician Discharge Summary  Patient ID: Reginald [name] MRN: [id number] DOB/AGE: September 17, 1965 58 y.o.  Admit date: 05/28/2023 Discharge date: 06/06/2023  Admission Diagnoses:  Patient Active Problem List   Diagnosis Date Noted   Unstable angina (HCC) 05/28/2023   Pulmonary nodule 05/28/2023   Essential hypertension 05/28/2023   IBS (irritable bowel syndrome) 05/28/2023   Melena 04/01/2023   Abdominal pain, epigastric 04/01/2023   Gastroesophageal reflux disease 01/01/2023   Helicobacter pylori gastritis 12/07/2021   Nausea and vomiting 09/04/2021   Chronic diarrhea 09/04/2021   Discharge Diagnoses:  Patient Active Problem List   Diagnosis Date Noted   S/P CABG (coronary artery bypass graft) 05/31/2023   Unstable angina (HCC) 05/28/2023   Pulmonary nodule 05/28/2023   Essential hypertension 05/28/2023   IBS (irritable bowel syndrome) 05/28/2023   Melena 04/01/2023   Abdominal pain, epigastric 04/01/2023   Gastroesophageal reflux disease 01/01/2023   Helicobacter pylori gastritis 12/07/2021   Nausea and vomiting 09/04/2021   Chronic diarrhea 09/04/2021   Discharged Condition: Stable  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  Needs further workup with PET/CT in outpatient setting.  HPI: Reginald [name] is a 58 year old man with a history of tobacco abuse, reflux, irritable bowel syndrome, and arthritis.  No prior cardiac history.   He presented on 05/28/2023 with a complaint of chest/epigastric pressure for several weeks.  He would get this both at rest and with exertion.  Sometimes will have radiation to his left neck and left arm.  He had been evaluated by GI including an upper endoscopy which was normal.   He presented to the emergency room and his high-sensitivity troponins were normal.  He had an echocardiogram which showed an ejection fraction of 35 to 40%.   No significant valvular pathology.  Yesterday he underwent cardiac catheterization and was found to have severe three-vessel coronary disease with a mildly elevated LVEDP.   Currently pain-free.   Also noted to have a 1 cm right upper lobe lung nodule.  CT showed advanced emphysematous change of the lungs along with the lung nodule.  There was no mediastinal or hilar adenopathy.  Reginald [name] reviewed the patient's diagnostic studies and determined he would benefit from surgical intervention. He reviewed the patient's treatment options as well as the risks and benefits of surgery. Reginald [name] was agreeable to proceed with surgery.  Hospital Course: Reginald [name] was admitted to [facility]. He remained stable and was brought to the operating room on 05/31/23. He underwent CABG x 3 utilizing the left internal mammary artery and saphenous from both thighs harvested with open technique. He tolerated the procedure well and was transferred to the SICU in stable condition.  He remained hemodynamically stable with minimal chest tube output.  He was weaned from the ventilator and extubated routinely by 1 AM on postop day 1.  Later in the morning on postop day 1, chest x-ray showed a large right pneumothorax.  A pigtail catheter was placed at the bedside with resolution.  The mediastinal and left pleural tubes were removed.  He was mobilized and progressed well.  He was ready for transfer to 4E progressive care on postop day 2.  The right pleural tube was left in place for a persistent small airleak.  Hypertension was managed with metoprolol  and  lisinopril.  He was continued on aspirin  and rosuvastatin  postoperatively.  Plavix was added started on 05/14. 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  Vascular surgery consult was obtained and due to the hand perfusion not being compromised, they did not feel revascularization or hematoma  evacuation would be indicated at this time. Gabapentin was started for wrist pain. Wrist pain improved with time and ice. He began moving his bowels but continued to complain of nausea, this improved with time. He was optimized on GDMT by advanced cardiology. His chest tube remained in place with persistent air leak. His chest xray showed minimal basilar pneumothorax. He was transitioned to a mini express and follow up CXR showed similar right apical pneumothorax. Every other leg staple was removed on 05/15 without complication. Chest tube sutures were removed as well. He was saturating well on RA and his bowels were moving appropriately. His incisions were healing well without sign of infection. He was felt stable for discharge home.    Consults: cardiology and vascular surgery  Significant Diagnostic Studies: angiography:     Ost LAD to Prox LAD lesion is 70% stenosed.   Mid LAD lesion is 80% stenosed.   1st Mrg lesion is 95% stenosed.   Prox Cx to Mid Cx lesion is 60% stenosed.   Prox RCA to Mid RCA lesion is 90% stenosed.   LV end diastolic pressure is mildly elevated.   3 vessel obstructive CAD Mildly elevated LVEDP   Plan: will resume IV heparin . CT surgery consultation.    Treatments: surgery  Operative Report    DATE OF PROCEDURE: 05/31/2023   PREOPERATIVE DIAGNOSIS:  Three-vessel coronary artery disease with impaired left ventricular function.   POSTOPERATIVE DIAGNOSIS:  Three-vessel coronary artery disease with impaired left ventricular function.   PROCEDURE:  Median sternotomy, extracorporeal circulation, coronary artery bypass grafting x3 (left internal mammary artery to LAD, saphenous vein graft to posterior descending, saphenous vein graft to obtuse marginal), endoscopic vein harvest right leg,  open vein harvest from both thighs.   SURGEON:  Reginald [name]. Reginald Salinas, MD.  Narrative & Impression  CLINICAL DATA:  Pneumothorax.   EXAM: CHEST - 2 VIEW   COMPARISON:   06/05/2023   FINDINGS: Lungs are hyperexpanded. Right pleural drain again noted with evidence of right apical pneumothorax, similar to prior. Left lung remains clear. Nodular density over the left base is likely a nipple shadow. The cardiopericardial silhouette is within normal limits for size. Telemetry leads overlie the chest.   IMPRESSION: Right pleural drain with right apical pneumothorax, similar to prior.     Electronically Signed   By: Donnal Fusi M.D.   On: 06/06/2023 07:27   Discharge Exam: Blood pressure 117/78, pulse 97, temperature 98 F (36.7 C), temperature source Oral, resp. rate 19, height 5\' 8"  (1.727 m), weight 56.7 kg, SpO2 100%. Cardiovascular: Slightly tachycardic Pulmonary: Clear to auscultation bilaterally Abdomen: Soft, non tender, bowel sounds present. Extremities: Trace bilateral lower extremity edema. Ice pack right forearm. Motor/sensory intact Wounds: Clean and dry.  No erythema or signs of infection.   Discharge Medications:  The patient has been discharged on:   1.Beta Blocker:  Yes [ X  ]                              No   [   ]  If No, reason:  2.Ace Inhibitor/ARB: Yes [ X  ]                                     No  [    ]                                     If No, reason:  3.Statin:   Yes [ X  ]                  No  [   ]                  If No, reason:  4.Ecasa:  Yes  [ X  ]                  No   [   ]                  If No, reason:  Patient had ACS upon admission: Yes  Plavix/P2Y12 inhibitor: Yes [  X ]                                      No  [   ]     Discharge Instructions     Amb Referral to Cardiac Rehabilitation   Complete by: As directed    Diagnosis: CABG   CABG X ___: 3   After initial evaluation and assessments completed: Virtual Based Care may be provided alone or in conjunction with Phase 2 Cardiac Rehab based on patient barriers.: Yes   Intensive Cardiac Rehabilitation (ICR)  MC location only OR Traditional Cardiac Rehabilitation (TCR) *If criteria for ICR are not met will enroll in TCR [facility] only): Yes      Allergies as of 06/06/2023       Reactions   Penicillins Rash   PCN: Immediate rash, facial/tongue/throat swelling, SOB or lightheadedness with hypotension.   (He has received Ceftriaxone  IM on 06/10/2017 without adverse reaction /none reported).        Medication List     STOP taking these medications    omeprazole  40 MG capsule Commonly known as: PRILOSEC   traMADol  50 MG tablet Commonly known as: ULTRAM        TAKE these medications    aspirin  EC 81 MG tablet Take 1 tablet (81 mg total) by mouth daily. Swallow whole.   clopidogrel 75 MG tablet Commonly known as: PLAVIX Take 1 tablet (75 mg total) by mouth daily.   dicyclomine  10 MG capsule Commonly known as: BENTYL  TAKE 1 CAPSULE (10 MG TOTAL) BY MOUTH EVERY 12 (TWELVE) HOURS AS NEEDED (ABDOMINAL PAIN).   diphenoxylate -atropine  2.5-0.025 MG tablet Commonly known as: LOMOTIL  TAKE 1 TABLET BY MOUTH 4 (FOUR) TIMES DAILY AS NEEDED FOR DIARRHEA OR LOOSE STOOLS.   empagliflozin 10 MG Tabs tablet Commonly known as: JARDIANCE Take 1 tablet (10 mg total) by mouth daily.   gabapentin 300 MG capsule Commonly known as: NEURONTIN Take 1 capsule (300 mg total) by mouth 2 (two) times daily.   losartan 25 MG tablet Commonly known as: COZAAR Take 1 tablet (25 mg total) by mouth daily.   MAGNESIUM  PO Take 1 tablet by mouth at bedtime.  metoprolol  succinate 50 MG 24 hr tablet Commonly known as: TOPROL -XL Take 1 tablet (50 mg total) by mouth at bedtime. Take with or immediately following a meal.   multivitamin tablet Take 1 tablet by mouth at bedtime.   nicotine  21 mg/24hr patch Commonly known as: NICODERM CQ  - dosed in mg/24 hours Place 1 patch (21 mg total) onto the skin daily.   nicotine  polacrilex 2 MG gum Commonly known as: NICORETTE  Take 1 each (2 mg total) by mouth as needed  for smoking cessation.   ondansetron  4 MG tablet Commonly known as: ZOFRAN  TAKE 1 TABLET BY MOUTH EVERY 8 HOURS AS NEEDED FOR NAUSEA AND VOMITING   oxyCODONE  5 MG immediate release tablet Commonly known as: Oxy IR/ROXICODONE  Take 1 tablet (5 mg total) by mouth every 6 (six) hours as needed for severe pain (pain score 7-10).   promethazine  12.5 MG tablet Commonly known as: PHENERGAN  TAKE 1 TABLET BY MOUTH EVERY 6 HOURS AS NEEDED FOR NAUSEA OR VOMITING.   rosuvastatin  20 MG tablet Commonly known as: CRESTOR  Take 1 tablet (20 mg total) by mouth daily.   spironolactone 25 MG tablet Commonly known as: ALDACTONE Take 0.5 tablets (12.5 mg total) by mouth daily.   sucralfate  1 g tablet Commonly known as: Carafate  Take 1 tablet (1 g total) by mouth 4 (four) times daily -  with meals and at bedtime. Dissolve tablet in 30ml of water, then drink slurry        Follow-up Information     Lasalle Pointer, NP Follow up on 06/26/2023.   Specialty: Cardiology Why: Appointment is at 8:30 Contact information: [address] [place] [address] [phone]         [name], MD Follow up on 07/09/2023.   Specialty: Cardiothoracic Surgery Why: Appointment is at 1:00, please get CXR at 12:00 on second floor of our office building Contact information: [address] [place] [address] [phone]         [facility]. Go in 8 day(s).   Specialty: Cardiology Why: Hospitaal follow up 06/14/2023 @ 9:30 am PLEASE bring a current meducation list to appointment FREE valet parking, Entrance C, off [employer] (look for [employer] [facility] ) Contact information: [address] [place]  [phone]        [facility] of [employer] Follow up on 06/12/2023.   Specialty: Cardiothoracic Surgery Why: Appointment is at 10:30AM, please  get a chest xray at 9:30AM on the 2nd floor of our office building Contact information: [address] [place]  [address] [phone]                Signed:  Allegra Arch, PA-C  06/06/2023, 9:24 AM

## 2023-06-05 ENCOUNTER — Encounter (HOSPITAL_COMMUNITY): Payer: Self-pay | Admitting: Thoracic Surgery (Cardiothoracic Vascular Surgery)

## 2023-06-05 ENCOUNTER — Inpatient Hospital Stay (HOSPITAL_COMMUNITY)

## 2023-06-05 DIAGNOSIS — I200 Unstable angina: Secondary | ICD-10-CM | POA: Diagnosis not present

## 2023-06-05 LAB — BASIC METABOLIC PANEL WITH GFR
Anion gap: 8 (ref 5–15)
BUN: 13 mg/dL (ref 6–20)
CO2: 29 mmol/L (ref 22–32)
Calcium: 8.5 mg/dL — ABNORMAL LOW (ref 8.9–10.3)
Chloride: 100 mmol/L (ref 98–111)
Creatinine, Ser: 0.92 mg/dL (ref 0.61–1.24)
GFR, Estimated: >60 mL/min (ref >60)
Glucose, Bld: 107 mg/dL — ABNORMAL HIGH (ref 70–99)
Potassium: 3.4 mmol/L — ABNORMAL LOW (ref 3.5–5.1)
Sodium: 137 mmol/L (ref 135–145)

## 2023-06-05 LAB — GLUCOSE, CAPILLARY
Glucose-Capillary: 121 mg/dL — ABNORMAL HIGH (ref 70–99)
Glucose-Capillary: 128 mg/dL — ABNORMAL HIGH (ref 70–99)

## 2023-06-05 LAB — SURGICAL PATHOLOGY

## 2023-06-05 MED ORDER — POTASSIUM CHLORIDE CRYS ER 20 MEQ PO TBCR
40.0000 meq | EXTENDED_RELEASE_TABLET | Freq: Two times a day (BID) | ORAL | Status: AC
  Administered 2023-06-05 (×2): 40 meq via ORAL
  Filled 2023-06-05 (×2): qty 2

## 2023-06-05 MED ORDER — GABAPENTIN 300 MG PO CAPS
300.0000 mg | ORAL_CAPSULE | Freq: Two times a day (BID) | ORAL | Status: DC
  Administered 2023-06-05 – 2023-06-06 (×2): 300 mg via ORAL
  Filled 2023-06-05 (×2): qty 1

## 2023-06-05 MED ORDER — CLOPIDOGREL BISULFATE 75 MG PO TABS
75.0000 mg | ORAL_TABLET | Freq: Every day | ORAL | Status: DC
  Administered 2023-06-05 – 2023-06-06 (×2): 75 mg via ORAL
  Filled 2023-06-05 (×2): qty 1

## 2023-06-05 MED ORDER — ASPIRIN 81 MG PO TBEC
81.0000 mg | DELAYED_RELEASE_TABLET | Freq: Every day | ORAL | Status: DC
  Administered 2023-06-06: 81 mg via ORAL
  Filled 2023-06-05: qty 1

## 2023-06-05 MED FILL — Potassium Chloride Inj 2 mEq/ML: INTRAVENOUS | Qty: 40 | Status: AC

## 2023-06-05 MED FILL — Magnesium Sulfate Inj 50%: INTRAMUSCULAR | Qty: 10 | Status: AC

## 2023-06-05 MED FILL — Heparin Sodium (Porcine) Inj 1000 Unit/ML: Qty: 1000 | Status: AC

## 2023-06-05 NOTE — Evaluation (Addendum)
 Physical Therapy Evaluation & Discharge Patient Details Name: Reginald [name] MRN: [id number] DOB: 01/08/1966 Today's Date: 06/05/2023  History of Present Illness  Pt is a 58 y.o. male who presented 05/28/23 with intermittent CP. S/p L heart cath and coronary angiography 5/7, which showed 3-vessel obstructive CAD. S/p CABGx3 5/9. Extubated 5/10. PMH: arthritis, GERD, IBS  Clinical Impression  Pt presents with condition above. PTA, he was independent without DME, working, and living with his significant other and x2 young children in a mobile home with 3 STE. Currently, he is able to perform all functional mobility without AD, LOB, or physical assistance. He does display some mild swaying and imbalance along with some mild endurance deficits with gait and stairs, but did not have any overt LOB. He has good family support at d/c and will likely progress quickly with continued mobility. Will defer further mobility to mobility techs, cardiac rehab, and nursing staff. Educated pt on activity pacing, knowing his limits, safe mobility and mobility progression, using RW as needed for safety (but try to progress to no AD as long as it is safe), and using handrails for light support on stairs for safety. He verbalized understanding. Provided pt with education and handouts on his sternal precautions. All education completed and questions answered. No further skilled PT needs identified. Will sign off. Thank you for this referral.      If plan is discharge home, recommend the following: A little help with bathing/dressing/bathroom;Assistance with cooking/housework;Assist for transportation;Help with stairs or ramp for entrance   Can travel by private vehicle        Equipment Recommendations None recommended by PT  Recommendations for Other Services       Functional Status Assessment Patient has had a recent decline in their functional status and demonstrates the ability to make significant improvements in  function in a reasonable and predictable amount of time.     Precautions / Restrictions Precautions Precautions: Sternal Precaution Booklet Issued: Yes (comment) Recall of Precautions/Restrictions: Intact Precaution/Restrictions Comments: reviewed precautions and provided handouts; portable, small chest tube box Restrictions Weight Bearing Restrictions Per Provider Order: Yes Other Position/Activity Restrictions: sternal precautions      Mobility  Bed Mobility               General bed mobility comments: Pt sitting EOB upon arrival and at end of session.    Transfers Overall transfer level: Independent Equipment used: None               General transfer comment: Able to transfer to stand from EOB 2x without LOB or assistance. Did need x1 reminder to not push up on bed to stand but rather keep hands on lap or chest to maintain sternal precautions compliance though.    Ambulation/Gait Ambulation/Gait assistance: Supervision Gait Distance (Feet): 320 Feet Assistive device: None Gait Pattern/deviations: Step-through pattern, Decreased stride length Gait velocity: WFL     General Gait Details: Pt ambulates with noted mild intermittent trunk sway, but no overt LOB. Educated pt on activity pacing. Supervision for safety  Stairs Stairs: Yes Stairs assistance: Contact guard assist Stair Management: No rails, Step to pattern, Forwards Number of Stairs: 3 General stair comments: Ascends and descends slowly with step-to pattern and arms down by side even though encouraged pt to hold onto rail for light support but not push through arms to maintain his precautions. Pt slower and needing extra time and focus for eccentric control and balance when descending. Mild unsteadiness noted, but no LOB.  CGA for safety. Educated pt to lightly hold onto a rail for just extra safety measures when he d/c's home though. He verbalized understanding.  Wheelchair Mobility     Tilt Bed     Modified Rankin (Stroke Patients Only)       Balance Overall balance assessment: Mild deficits observed, not formally tested                                           Pertinent Vitals/Pain Pain Assessment Pain Assessment: Faces Faces Pain Scale: No hurt Pain Intervention(s): Monitored during session    Home Living Family/patient expects to be discharged to:: Private residence Living Arrangements: Spouse/significant other;Children (x2 young sons) Available Help at Discharge: Family;Available 24 hours/day Type of Home: Mobile home Home Access: Stairs to enter Entrance Stairs-Rails: Can reach both Entrance Stairs-Number of Steps: 3   Home Layout: One level Home Equipment: Agricultural consultant (2 wheels);Cane - single point;BSC/3in1;Shower seat;Wheelchair - manual      Prior Function Prior Level of Function : Independent/Modified Independent;Working/employed             Mobility Comments: No AD ADLs 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     Extremity/Trunk Assessment   Upper Extremity Assessment Upper Extremity Assessment: Defer to OT evaluation (overall WFL for tasks performed, limited by sternal precautions)    Lower Extremity Assessment Lower Extremity Assessment: Overall WFL for tasks assessed    Cervical / Trunk Assessment Cervical / Trunk Assessment: Other exceptions Cervical / Trunk Exceptions: Median sternotomy 5/9  Communication   Communication Communication: No apparent difficulties    Cognition Arousal: Alert Behavior During Therapy: WFL for tasks assessed/performed   PT - Cognitive impairments: No apparent impairments                       PT - Cognition Comments: aware of his precautions, intermittently needs cues for compliance Following commands: Intact       Cueing Cueing Techniques: Verbal cues     General Comments General comments (skin integrity, edema, etc.): educated pt on activity pacing, knowing his  limits, safe mobility and mobility progression, using RW as needed for safety (but try to progress to no AD as long as it is safe), and using handrails for light support on stairs for safety. He verbalized understanding.    Exercises     Assessment/Plan    PT Assessment Patient does not need any further PT services  PT Problem List         PT Treatment Interventions      PT Goals (Current goals can be found in the Care Plan section)  Acute Rehab PT Goals Patient Stated Goal: to recover PT Goal Formulation: All assessment and education complete, DC therapy Time For Goal Achievement: 06/06/23 Potential to Achieve Goals: Good    Frequency       Co-evaluation               AM-PAC PT "6 Clicks" Mobility  Outcome Measure Help needed turning from your back to your side while in a flat bed without using bedrails?: None Help needed moving from lying on your back to sitting on the side of a flat bed without using bedrails?: None Help needed moving to and from a bed to a chair (including a wheelchair)?: None Help needed standing up from a chair using your arms (e.g.,  wheelchair or bedside chair)?: None Help needed to walk in hospital room?: A Little Help needed climbing 3-5 steps with a railing? : A Little 6 Click Score: 22    End of Session   Activity Tolerance: Patient tolerated treatment well Patient left: in bed;with call bell/phone within reach;with family/visitor present (no bed alarm on upon arrival)   PT Visit Diagnosis: Unsteadiness on feet (R26.81);Other abnormalities of gait and mobility (R26.89)    Time: 1450-1511 PT Time Calculation (min) (ACUTE ONLY): 21 min   Charges:   PT Evaluation $PT Eval Low Complexity: 1 Low   PT General Charges $$ ACUTE PT VISIT: 1 Visit         Vernida Goodie, PT, DPT Acute Rehabilitation Services  Office: [phone]   Ellyn Hack 06/05/2023, 3:27 PM

## 2023-06-05 NOTE — Progress Notes (Signed)
   Heart Failure Stewardship Pharmacist Progress Note   PCP: Cynda Drafts, FNP PCP-Cardiologist: Lasalle Pointer, MD    HPI:  58 yo M with PMH of arthritis, GERD, tobacco use, and IBS.   Presented to the ED on 5/6 with chest pain radiating to his arm. Troponin 17>15>13. EKG with subtle ST elevations and diffuse ST depressions. CT chest with incidental finding of 1cm pulmonary nodule in R apex. Pulmonary consulted and recommended outpatient PFT and PET scan. CXR with no edema or effusion. ECHO 5/7 with LVEF 35-40%, RWMA, G1DD, RV normal. Taken for [facility] on 5/7 and found to have 3 vessel obstructive CAD. CT surgery consulted and underwent CABG x 3 on 5/9. 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 Vascular surgery consulted and no interventions recommended.   Patient working with PT now. Will reattempt visit later today or tomorrow morning.   Current HF Medications: Beta Blocker: metoprolol  XL 50 mg daily ACE/ARB/ARNI: losartan 25 mg daily MRA: spironolactone 12.5 mg daily SGLT2i: Jardiance 10 mg daily  Prior to admission HF Medications: None  Pertinent Lab Values: Serum creatinine 0.92, BUN 13, Potassium 3.4, Sodium 137, BNP 175, Magnesium  1.7, A1c 5.4   Vital Signs: Weight: 124 lbs (admission weight: 132 lbs) Blood pressure: 110/70s  Heart rate: 80-100s  I/O: net -0.2L yesterday; net +2.6L since admission  Medication Assistance / Insurance Benefits Check: Does the patient have prescription insurance?  Yes Type of insurance plan: Cigna  Outpatient Pharmacy:  Prior to admission outpatient [facility] Is the patient willing to use [facility] TOC pharmacy at discharge? Yes Is the patient willing to transition their outpatient pharmacy to utilize a [facility] outpatient pharmacy?   Pending    Assessment: 1. Acute systolic CHF (LVEF 35-40%), due to ICM s/p CABG 5/9. NYHA class II symptoms. - Volume status ok per cardiology exam. No JVD or edema.  -  Continue metoprolol  XL 50 mg daily - On losartan 25 mg daily - last dose of lisinopril was given on 5/11 (did not get dose on 5/12 due to vomiting). Consider transitioning to Entresto 24/26 mg BID - Continue spironolactone 12.5 mg daily - Continue Jardiance 10 mg daily   Plan: 1) Medication changes recommended at this time: - Stop losartan and start Entresto 24/26 mg BID  2) Patient assistance: Viola Greulich copay [phone] - copay card lowers to $10 - Farxiga copay $117 - copay card lowers to $0 - Jardiance copay $123 - copay card lowers to $10 - Patient unavailable, working with PT. Will bring by copay cards to his room at a later time.   3)  Education  - To be completed prior to discharge  Jerilyn Monte, PharmD, BCPS Heart Failure Stewardship Pharmacist Phone [phone]

## 2023-06-05 NOTE — Progress Notes (Signed)
 Heart Failure Nurse Navigator Progress Note  PCP: Cynda Drafts, FNP PCP-Cardiologist: Mallipeddi Admission Diagnosis: None Admitted from: Home  Presentation:   Reginald [name] presented with chest pain that traveled to his left arm.Shortness of breath, diaphoresis with nausea and vomiting. Has smoked 1.5 packs of cigarettes for the last 43 years.  Reported it has been increasing for a month. BP 150/96, HR 82, BNP 175, Troponin 17, Left heart cath 5/7 showed 3-vessel obstructive CAD and underwent 05/31/23. CT chest with incidental finding of 1cm pulmonary nodule in R apex. Pulmonary consulted and recommended outpatient PFT and PET scan.   Patient and girlfriend were both educated on the sign and symptoms of heart failure, daily weights, when to call his doctor or go to the ED, Diet/ fluid restrictions, patient reported to drinking Dr. Kathlene Paradise and more then 64 oz per day. Continued education on taking all medications as prescribed and attending all medical appointments. Both patient and girlfriend verbalized their understanding of all education. A HF TOC appointment was scheduled for 06/14/2023 @ 9:30 am.   ECHO/ LVEF: 35-40%  Clinical Course:  Past Medical History:  Diagnosis Date   Arthritis    GERD (gastroesophageal reflux disease)    Headache    migraines    IBS (irritable bowel syndrome)      Social History   Socioeconomic History   Marital status: Significant Other    Spouse name: Not on file   Number of children: Not on file   Years of education: Not on file   Highest education level: Not on file  Occupational History   Not on file  Tobacco Use   Smoking status: Heavy Smoker    Current packs/day: 1.50    Types: Cigarettes    Passive exposure: Current   Smokeless tobacco: Former  Building services engineer status: Never Used  Substance and Sexual Activity   Alcohol use: No    Alcohol/week: 0.0 standard drinks of alcohol    Comment: formerly a heavy drinker, quit 2002   Drug  use: No   Sexual activity: Not on file  Other Topics Concern   Not on file  Social History Narrative   Not on file   Social Drivers of Health   Financial Resource Strain: Low Risk  (03/20/2023)   Received from [facility]   Overall Financial Resource Strain (CARDIA)    Difficulty of Paying Living Expenses: Not hard at all  Food Insecurity: Patient Declined (05/28/2023)   Hunger Vital Sign    Worried About Running Out of Food in the Last Year: Patient declined    Ran Out of Food in the Last Year: Patient declined  Transportation Needs: No Transportation Needs (05/28/2023)   PRAPARE - Administrator, Civil Service (Medical): No    Lack of Transportation (Non-Medical): No  Physical Activity: Sufficiently Active (03/20/2023)   Received from [facility]   Exercise Vital Sign    Days of Exercise per Week: 7 days    Minutes of Exercise per Session: 30 min  Stress: No Stress Concern Present (03/20/2023)   Received from [facility] of Occupational Health - Occupational Stress Questionnaire    Feeling of Stress : Only a little  Social Connections: Moderately Integrated (03/20/2023)   Received from [facility]   Social Connection and Isolation Panel [NHANES]    Frequency of Communication with Friends and Family: More than three times a week  Frequency of Social Gatherings with Friends and Family: Never    Attends Religious Services: More than 4 times per year    Active Member of [employer] or Organizations: No    Attends Engineer, structural: Never    Marital Status: Living with partner   Education Assessment and Provision:  Detailed education and instructions provided on heart failure disease management including the following:  Signs and symptoms of Heart Failure When to call the physician Importance of daily weights Low sodium diet Fluid restriction Medication management Anticipated future follow-up appointments  Patient  education given on each of the above topics.  Patient acknowledges understanding via teach back method and acceptance of all instructions.  Education Materials:  "Living Better With Heart Failure" Booklet, HF zone tool, & Daily Weight Tracker Tool.  Patient has scale at home: yes Patient has pill box at home: yes    High Risk Criteria for Readmission and/or Poor Patient Outcomes: Heart failure hospital admissions (last 6 months): 0  No Show rate: 5%  Difficult social situation: No, lives with girlfriend.  Demonstrates medication adherence: yes Primary Language: English Literacy level: reading, writing, and comprehension  Barriers of Care:   Diet/ fluid restrictions Daily weights New HF / smoking cessation  Considerations/Referrals:   Referral made to Heart Failure Pharmacist Stewardship: yes Referral made to Heart Failure CSW/NCM TOC: No Referral made to Heart & Vascular TOC clinic: Yes, 06/14/2023 @ 9:30 am.   Items for Follow-up on DC/TOC: Continued HF education Diet/ fluid restrictions/ daily weights Smoking cessation   Randie Bustle, BSN, RN Heart Failure Teacher, adult education Only

## 2023-06-05 NOTE — Progress Notes (Addendum)
 [address]       [place] [address]             [phone]      5 Days Post-Op Procedure(s) (LRB): CORONARY ARTERY BYPASS GRAFTING (CABG) TIMES THREE USING LEFT INTERNAL MAMMARY ARTERY AND OPENLY HARVESTED RIGHT AND LEFT GREATER SAPHENOUS VEIN (N/A) ECHOCARDIOGRAM, TRANSESOPHAGEAL, INTRAOPERATIVE (N/A) Subjective: Patient states he has a little pain in his right wrist but it is much better. No new complaints this AM. He has not walked in the hall yet.   Objective: Vital signs in last 24 hours: Temp:  [97.5 F (36.4 C)-99.3 F (37.4 C)] 98.1 F (36.7 C) (05/14 0350) Pulse Rate:  [74-97] 74 (05/14 0350) Cardiac Rhythm: Normal sinus rhythm (05/13 1900) Resp:  [17-20] 20 (05/14 0615) BP: (112-135)/(79-91) 112/82 (05/14 0350) SpO2:  [97 %-98 %] 97 % (05/14 0350) Weight:  [56.6 kg] 56.6 kg (05/14 0615)  Hemodynamic parameters for last 24 hours:    Intake/Output from previous day: 05/13 0701 - 05/14 0700 In: 480 [P.O.:480] Out: 1100 [Urine:1100] Intake/Output this shift: No intake/output data recorded.  General appearance: alert, cooperative, and no distress Neurologic: intact Heart: regular rate and rhythm, S1, S2 normal, no murmur, click, rub or gallop Lungs: clear to auscultation bilaterally Abdomen: soft, non-tender; bowel sounds normal; no masses,  no organomegaly Extremities: edema trace Wound: sternal incision and chest tube site are clean and dry without sign of infection, minimal bloody drainage from leg incisions, no sign of infection, ecchymosis of bilateral legs and staples in place, right wrist less tender, neurovascularly intact, lidocaine  patch and heating pad in place  Lab Results: Recent Labs    06/03/23 0425  WBC 14.6*  HGB 10.5*  HCT 30.6*  PLT 136*   BMET:  Recent Labs    06/03/23 0425 06/05/23 0353  NA 135 137  K 3.9 3.4*  CL 99 100  CO2 27 29  GLUCOSE 134* 107*  BUN 16 13  CREATININE 0.83 0.92  CALCIUM  8.3*  8.5*    PT/INR: No results for input(s): "LABPROT", "INR" in the last 72 hours. ABG    Component Value Date/Time   PHART 7.384 06/01/2023 0035   HCO3 21.1 06/01/2023 0035   TCO2 22 06/01/2023 0035   ACIDBASEDEF 3.0 (H) 06/01/2023 0035   O2SAT 95 06/01/2023 0035   CBG (last 3)  Recent Labs    06/04/23 1638 06/04/23 2119 06/05/23 0613  GLUCAP 106* 127* 121*    Assessment/Plan: S/P Procedure(s) (LRB): CORONARY ARTERY BYPASS GRAFTING (CABG) TIMES THREE USING LEFT INTERNAL MAMMARY ARTERY AND OPENLY HARVESTED RIGHT AND LEFT GREATER SAPHENOUS VEIN (N/A) ECHOCARDIOGRAM, TRANSESOPHAGEAL, INTRAOPERATIVE (N/A)  Neuro: Patient's right wrist pain continues to improve. Continue current pain regimen   CV: HTN ok control, SBP mostly 110s-120s, up to 135. On Toprol  XL 50mg  nightly and Cozaar 25mg  daily. Will add Plavix prior to discharge. NSR, HR 90s. EKG with stable lateral T wave inversion, patient denies chest pain. Cardiology following and added Jardiance 10mg  daily and Spironolactone 12.5mg  daily.   Pulm: Saturating well on RA this AM. CXR with stable small right basilar pneumothorax. No CT output recorded. CT to water seal, no air leak this AM. D/C chest tube?   GI: +BM. Nausea improved. Appetite improving. Continue Zofran  and Compazine PRN   Endo: Preop A1C 5.4. CBGs controlled on SSI. Will d/c SSI and CBGs   Renal: Last Cr 0.92. UO 1100cc/24hrs. Under preop weight, will discontinue Lasix. On Spironolactone  per cardiology. K 3.4, will supplement. Trace edema but patient has not ambulated much, increase ambulation today.   DVT Prophylaxis: Lovenox   Wound: Staples in place, will discuss removal timing with Dr. Luna Salinas.   Dispo: Hopefully can d/c chest tube today and discharge home tomorrow morning if patient remains stable and increases ambulation.   LOS: 8 days    Randa Burton, PA-C 06/05/2023

## 2023-06-05 NOTE — Op Note (Signed)
 Reginald [name], [name] MEDICAL RECORD NO: [id number] ACCOUNT NO: [id number] DATE OF BIRTH: December 16, 1965 FACILITY: [facility] LOCATION: [facility] PHYSICIAN: [name]. 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  POSTOPERATIVE DIAGNOSIS:  Three-vessel coronary artery disease with impaired left ventricular function.  PROCEDURE:  Median sternotomy, extracorporeal circulation,  Coronary artery bypass grafting x3  Left internal mammary artery to LAD,  Saphenous vein graft to posterior descending,  Saphenous vein graft to obtuse marginal, Endoscopic vein harvest right leg, Open vein harvest from both thighs.  SURGEON:  [name]. Luna Salinas, MD.  ASSISTANTS:  Debroah Fanning, PA; Matt Song, [place] and [place], [place].  Experience assistance was necessary for this case due to surgical complexity.  Debroah Fanning attempted endoscopic vein harvest and then dissipated in the open vein harvest.  She also assisted with exposure, retraction of delicate tissues, suture management, and suctioning during the anastomoses.  Wayne Gold and Mayra Roddenberry also assisted with the open vein harvest bilaterally.  ANESTHESIA:  General.  FINDINGS:  Poor quality saphenous vein.  Vein harvest from both thighs resulted in satisfactory vein for use as bypass grafts.  Good quality mammary artery.  Good quality targets.  Transesophageal echocardiography revealed ejection fraction of approximately 35%, unchanged post bypass.  CLINICAL NOTE: Reginald [name] is a 58 year old gentleman who presented with chest and epigastric pressure of several weeks' duration. It would occur both at rest and with exertion. Ruled out for MI with high sensitivity troponins, but an echocardiogram showed an ejection fraction of 35%-40%.  Cardiac catheterization showed severe 3-vessel disease with a mildly elevated LV end diastolic  pressure. He was advised to undergo coronary artery bypass grafting.  There was consideration given to using a left radial artery, but his vascular labs were abnormal and that was confirmed with an Allen's test in the operating room which resulted in a complete loss of any Doppler signal in the palmar arch and complete loss of a pulse ox signal in the thumb and forefinger with radial compression. The indications, risks, benefits, and alternatives were discussed in detail with the patient.  He understood and accepted the risks and agreed to proceed.  OPERATIVE NOTE: Reginald [name] was brought to the operating room on 05/31/2023.  He had induction of general anesthesia. Allen's test was confirmed to be abnormal and not suitable for use of the left radial artery as previously noted. Intravenous antibiotics  were administered.  A Foley catheter was placed.  Transesophageal echocardiography was performed and revealed an ejection fraction of approximately 35%-40% with no significant valvular pathology.  The chest, abdomen, and legs were prepped and draped in the usual sterile fashion.  A timeout was performed.  A median sternotomy was performed and the left internal mammary artery was harvested in standard fashion. Simultaneously, Debroah Fanning made an incision in the medial aspect of the left leg at the level of the knee.  The greater saphenous vein was identified, but was very small.  It was unclear if this was the actual saphenous vein or not, but it was not suitable for endoscopic vein harvest or for use as a bypass graft.  An incision was made in the medial aspect of the right leg at the level of the knee and similar findings were noted.  Ultimately, both Matt Song and Parris Bolognese assisted with the saphenous vein harvest obtaining vein from both thighs. There was ultimately adequate vein that was satisfactory  to use for bypass grafting.  2000 units of heparin  was administered during the vessel  harvest.  After harvesting the conduits, the leg incisions were closed. The sternal retractor was placed and was gradually opened over time.  The pericardium was opened.  The remainder of the full dose of heparin  was administered. After confirming adequate anticoagulation, the aorta was cannulated via concentric 2-0 Ethibond pledgeted pursestring sutures.  A dual-stage venous cannula was placed via a pursestring suture in the right atrial appendage.  Cardiopulmonary bypass was initiated.  Flows were maintained per protocol.  The patient was cooled to 34 degrees Celsius.  The coronary arteries were inspected and anastomotic sites were chosen.  The conduits were inspected and cut to length.  The foam pad was placed in the pericardium to insulate the heart.  A temperature probe was placed in the myocardial septum and a cardioplegia cannula was placed in the ascending aorta.  The aorta was cross-clamped.  The left ventricle was emptied via the aortic root vent.  Cardiac arrest was achieved with a combination of cold antegrade blood cardioplegia and topical ice saline.  A reversed saphenous vein graft was placed end-to-side to the posterior descending branch of the right coronary. Vein fair quality. Target good quality.  Anastomosis was performed with a running 7-0 Prolene suture.  A probe passed easily proximally and  distally. Cardioplegia was administered down the graft and there was good flow and good hemostasis.  A reversed saphenous vein graft was placed end-to-side to the obtuse marginal.  This was a good quality target vessel.  The vein again was fair quality. It was anastomosed with a running 7-0 Prolene suture. A probe passed proximally and distally. Cardioplegia was administered and there was good hemostasis and good flow.  Additional cardioplegia was administered via the aortic root.  The left internal mammary artery was brought through a window in the pericardium.  The distal end was beveled. It  was anastomosed end-to-side to the distal LAD. The LAD was a 1.5 mm good quality target vessel.  The mammary was a good quality conduit.  The end-to-side anastomosis was performed with a running 8-0 Prolene suture.  At the completion of the mammary to LAD anastomosis, bulldog clamp was briefly removed to inspect for hemostasis.  Rapid septal rewarming was noted.  The Bulldog clamp was replaced and the mammary pedicle was tacked to the epicardial surface of the heart with 6-0 Prolene sutures.  Additional cardioplegia was administered.  The vein grafts were cut to length.  The cardioplegia cannula was removed from the ascending aorta and the proximal vein graft anastomoses were performed with 4.0-mm punch aortotomies with running 6-0 Prolene sutures.  At the completion of the final proximal anastomosis, the patient was placed in Trendelenburg position.  Lidocaine  was administered.  The bulldog clamp was again removed from the left mammary artery.  The aortic root was de-aired and the aortic cross-clamp was removed. Total cross-clamp time was 60 minutes.  While rewarming was completed, all proximal and distal anastomosis were inspected for hemostasis.  Epicardial pacing wires were placed on the right ventricle and right atrium.  When the patient had rewarmed to a core temperature of 37 degrees Celsius, he was weaned from cardiopulmonary bypass on the first attempt.  The total bypass time was 123 minutes.  The patient was on an epinephrine  infusion at 4 mcg per minute.  Cardiac output was 5.5 L/min.  A test dose of protamine  was administered and was well tolerated.  The  atrial and aortic cannulae were removed.  The remainder of the protamine  was administered without incident.  The chest was irrigated with warm saline. Hemostasis was achieved.  Left pleural and mediastinal chest tubes were placed through separate subcostal incisions and secured with #1 silk sutures.  Pectoralis fascia, subcutaneous tissue and skin  were closed in standard fashion. The leg incisions had been closed during the procedure earlier. All sponge, needle, and instrument counts were correct at the end of the procedure.  The patient was transported from the operating room to the surgical intensive care unit intubated and in fair condition.   NIK D: 06/04/2023 5:13:03 pm T: 06/05/2023 4:17:00 am  JOB: [id number]/ [id number]

## 2023-06-05 NOTE — Progress Notes (Addendum)
 CARDIAC REHAB PHASE I   PRE:  Rate/Rhythm: 95 SR    BP: sitting 114/85    SpO2: 97 RA  MODE:  Ambulation: 430 ft   POST:  Rate/Rhythm: 114 ST    BP: sitting 150/93     SpO2: 96 RA  Pt stood from recliner independently and walked with RW. Steady, no c/o. Return to recliner, VSS although BP elevated. He has a RW at home. He can walk with his wife here in hospital. Encouraged x3 every day.  Discussed with pt and wife IS, sternal precautions, smoking cessation, diet (heart healthy), exercise, and CRPII. Pt very receptive, quit smoking day he came in. Very motivated. Will refer to [place] CRPII. HF navigator to discuss HF management.   [id number]  German Koller BS, ACSM-CEP 06/05/2023 11:19 AM

## 2023-06-05 NOTE — Progress Notes (Signed)
 Removed patient's right hand IV due to it being painful and unable to flush. Placed IV consult and was told that because the patient does not have any abx or PRN meds that this patient does not get another IV.   IV team noted that they just don't put in IVs "just in case". responded back and informed them that this patient had a CABG and he is cardiac. This RN still was unable to obtain an IV.  Under orders stated, "maintain saline lock x1".

## 2023-06-05 NOTE — Progress Notes (Signed)
   Patient Name: Reginald [name] Date of Encounter: 06/05/2023 [facility] Cardiologist: Vishnu P Mallipeddi, MD   Interval Summary  .    Feeling well.   R arm slightly painful.  No acute events overnight.  Vital Signs .    Vitals:   06/04/23 2317 06/05/23 0350 06/05/23 0615 06/05/23 0813  BP: (!) 122/91 112/82  111/74  Pulse: 85 74  95  Resp: 20 20 20 19   Temp: 98.2 F (36.8 C) 98.1 F (36.7 C)  97.9 F (36.6 C)  TempSrc: Oral Oral  Oral  SpO2: 97% 97%  100%  Weight:   56.6 kg   Height:        Intake/Output Summary (Last 24 hours) at 06/05/2023 0933 Last data filed at 06/05/2023 0259 Gross per 24 hour  Intake 240 ml  Output 1100 ml  Net -860 ml      06/05/2023    6:15 AM 06/04/2023    5:00 AM 06/03/2023    3:26 AM  Last 3 Weights  Weight (lbs) 124 lb 12.5 oz 128 lb 1.4 oz 128 lb 8.5 oz  Weight (kg) 56.6 kg 58.1 kg 58.3 kg      Telemetry/ECG    Sinus Rhythm, 80-90s - Personally Reviewed  Physical Exam .    GEN: No acute distress.   Neck: No JVD Cardiac: RRR, no murmurs, rubs, or gallops.  Respiratory: Slightly diminished in the right LL. Right CT in place GI: Soft, nontender, non-distended  MS: No edema Skin: dressings to bilateral legs with some old blood on the left side.  Assessment & Plan .     58 y.o. male with past medical history of GERD, migraines, IBS, arthritis who presented to [facility] with intermittent chest pain. He was transferred to [facility] to undergo pulmonary consultation for incidental finding of lung nodule. He underwent LHC on 05/29/2023 that showed 3-vessel obstructive CAD and underwent CABG   Unstable angina (troponins not elevated) CAD s/p CABG  -- underwent 3V CABG on 5/9, overall progressing well -- on ASA, statin, Toprol .  ICM -- echo 5/7 showed LVEF of 35-40%, g1DD, normal RV, akinesis of the m/d lateral wall, posterior wall, and mid anterolateral wall -- GDMT: continue Toprol  50 at bedtime, jardiance,  spironolactone 12.5, losartan 25  >> start Entresto tomorrow after lisinopril wash out (last dose 5.12)  HLD -- continue crestor  20mg  daily  Right wrist pain w/ radial artery occlusion -- seen by VVS yesterday with recommendations for conservative management, no plans for radial evacuation or hematoma evacuation   Other diagnoses per primary Lung Nodule-- needs additional work up as an outpatient  Severe Emphysema Tobacco use IBS GERD  For questions or updates, please contact [facility] Please consult [email or web address] for contact info under        Signed, Johngabriel Verde K Mihaela Fajardo, MD

## 2023-06-06 ENCOUNTER — Inpatient Hospital Stay (HOSPITAL_COMMUNITY)

## 2023-06-06 ENCOUNTER — Other Ambulatory Visit (HOSPITAL_COMMUNITY): Payer: Self-pay

## 2023-06-06 LAB — BASIC METABOLIC PANEL WITH GFR
Anion gap: 9 (ref 5–15)
BUN: 14 mg/dL (ref 6–20)
CO2: 25 mmol/L (ref 22–32)
Calcium: 8.9 mg/dL (ref 8.9–10.3)
Chloride: 103 mmol/L (ref 98–111)
Creatinine, Ser: 0.85 mg/dL (ref 0.61–1.24)
GFR, Estimated: >60 mL/min (ref >60)
Glucose, Bld: 125 mg/dL — ABNORMAL HIGH (ref 70–99)
Potassium: 3.9 mmol/L (ref 3.5–5.1)
Sodium: 137 mmol/L (ref 135–145)

## 2023-06-06 LAB — CBC
HCT: 32.4 % — ABNORMAL LOW (ref 39.0–52.0)
Hemoglobin: 11 g/dL — ABNORMAL LOW (ref 13.0–17.0)
MCH: 30.7 pg (ref 26.0–34.0)
MCHC: 34 g/dL (ref 30.0–36.0)
MCV: 90.5 fL (ref 80.0–100.0)
Platelets: 345 10*3/uL (ref 150–400)
RBC: 3.58 MIL/uL — ABNORMAL LOW (ref 4.22–5.81)
RDW: 12.9 % (ref 11.5–15.5)
WBC: 12.3 10*3/uL — ABNORMAL HIGH (ref 4.0–10.5)
nRBC: 0 % (ref 0.0–0.2)

## 2023-06-06 MED ORDER — POTASSIUM CHLORIDE CRYS ER 20 MEQ PO TBCR
20.0000 meq | EXTENDED_RELEASE_TABLET | Freq: Once | ORAL | Status: AC
  Administered 2023-06-06: 20 meq via ORAL
  Filled 2023-06-06: qty 1

## 2023-06-06 MED ORDER — LOSARTAN POTASSIUM 25 MG PO TABS
25.0000 mg | ORAL_TABLET | Freq: Every day | ORAL | 1 refills | Status: AC
Start: 2023-06-06 — End: ?
  Filled 2023-06-06: qty 30, 30d supply, fill #0

## 2023-06-06 MED ORDER — OXYCODONE HCL 5 MG PO TABS
5.0000 mg | ORAL_TABLET | Freq: Four times a day (QID) | ORAL | 0 refills | Status: DC | PRN
  Filled 2023-06-06: qty 28, 7d supply, fill #0

## 2023-06-06 MED ORDER — SPIRONOLACTONE 25 MG PO TABS
12.5000 mg | ORAL_TABLET | Freq: Every day | ORAL | 1 refills | Status: DC
  Filled 2023-06-06: qty 30, 60d supply, fill #0

## 2023-06-06 MED ORDER — ASPIRIN 81 MG PO TBEC: 81.0000 mg | DELAYED_RELEASE_TABLET | Freq: Every day | ORAL | Status: AC

## 2023-06-06 MED ORDER — ROSUVASTATIN CALCIUM 20 MG PO TABS
20.0000 mg | ORAL_TABLET | Freq: Every day | ORAL | 1 refills | Status: DC
  Filled 2023-06-06: qty 30, 30d supply, fill #0

## 2023-06-06 MED ORDER — NICOTINE POLACRILEX 2 MG MT GUM
2.0000 mg | CHEWING_GUM | OROMUCOSAL | 0 refills | Status: DC | PRN
  Filled 2023-06-06: qty 100, fill #0

## 2023-06-06 MED ORDER — CLOPIDOGREL BISULFATE 75 MG PO TABS
75.0000 mg | ORAL_TABLET | Freq: Every day | ORAL | 1 refills | Status: AC
  Filled 2023-06-06: qty 30, 30d supply, fill #0

## 2023-06-06 MED ORDER — EMPAGLIFLOZIN 10 MG PO TABS
10.0000 mg | ORAL_TABLET | Freq: Every day | ORAL | 1 refills | Status: AC
  Filled 2023-06-06: qty 30, 30d supply, fill #0

## 2023-06-06 MED ORDER — NICOTINE 21 MG/24HR TD PT24
21.0000 mg | MEDICATED_PATCH | Freq: Every day | TRANSDERMAL | 0 refills | Status: DC
  Filled 2023-06-06: qty 28, 28d supply, fill #0

## 2023-06-06 MED ORDER — METOPROLOL SUCCINATE ER 50 MG PO TB24
50.0000 mg | ORAL_TABLET | Freq: Every day | ORAL | 1 refills | Status: AC
  Filled 2023-06-06: qty 30, 30d supply, fill #0

## 2023-06-06 MED ORDER — GABAPENTIN 300 MG PO CAPS
300.0000 mg | ORAL_CAPSULE | Freq: Two times a day (BID) | ORAL | 0 refills | Status: AC
  Filled 2023-06-06: qty 60, 30d supply, fill #0

## 2023-06-06 NOTE — Patient Instructions (Signed)

## 2023-06-06 NOTE — Progress Notes (Addendum)
      [address]       [place] [address]             [phone]        6 Days Post-Op Procedure(s) (LRB): CORONARY ARTERY BYPASS GRAFTING (CABG) TIMES THREE USING LEFT INTERNAL MAMMARY ARTERY AND OPENLY HARVESTED RIGHT AND LEFT GREATER SAPHENOUS VEIN (N/A) ECHOCARDIOGRAM, TRANSESOPHAGEAL, INTRAOPERATIVE (N/A)  Subjective: He states right wrist pain is improving. He has no other complaint and wants to go home.  Objective: Vital signs in last 24 hours: Temp:  [97.9 F (36.6 C)-98.9 F (37.2 C)] 98.2 F (36.8 C) (05/15 0452) Pulse Rate:  [95-100] 99 (05/15 0452) Cardiac Rhythm: Normal sinus rhythm (05/14 2130) Resp:  [18-19] 18 (05/15 0452) BP: (111-132)/(68-92) 121/77 (05/15 0452) SpO2:  [100 %] 100 % (05/15 0452) Weight:  [56.7 kg] 56.7 kg (05/15 0452)  Pre op weight 56.7 kg Current Weight  06/06/23 56.7 kg      Intake/Output from previous day: 05/14 0701 - 05/15 0700 In: 0  Out: 1900 [Urine:1900]   Physical Exam:  Cardiovascular: Slightly tachycardic Pulmonary: Clear to auscultation bilaterally Abdomen: Soft, non tender, bowel sounds present. Extremities: Trace bilateral lower extremity edema. Ice pack right forearm. Motor/sensory intact Wounds: Clean and dry.  No erythema or signs of infection.  Lab Results: CBC: Recent Labs    06/06/23 0355  WBC 12.3*  HGB 11.0*  HCT 32.4*  PLT 345   BMET:  Recent Labs    06/05/23 0353 06/06/23 0355  NA 137 137  K 3.4* 3.9  CL 100 103  CO2 29 25  GLUCOSE 107* 125*  BUN 13 14  CREATININE 0.92 0.85  CALCIUM  8.5* 8.9    PT/INR:  Lab Results  Component Value Date   INR 1.5 (H) 05/31/2023   INR 1.0 05/28/2023   ABG:  INR: Will add last result for INR, ABG once components are confirmed Will add last 4 CBG results once components are confirmed  Assessment/Plan:  1. CV - SR, slightly tachycardic this am. On Toprol  XL 50 mg daily at hs, Losartan 25 mg daily, and Plavix. Also, on  Spironolactone 12.5 mg daily and Jardiance 10 mg daily for GDMT. 2.  Pulmonary - On room air. PA/LAT CXR this am appears to show stable right apical pneumothorax, small left pleural effusion, and bibasilar atelectasis Encourage incentive spirometer. 3.  Expected post op acute blood loss anemia - H and H this am stable at 11 and 32.4 4. Supplement potassium 5. Right wrist pain-has small hematoma and radial artery occlusion after line placement. Motor/sensory intact. Per VVS, should improve with time 6. On Lovenox for DVT prophylaxis 7. Will remove every other leg staple and chest tube sutures 8. Disposition-discharge home  Joanell Mowers ZimmermanPA-C 6:55 AM Patient seen and examined, agree with above No air leak and CXR better but would not be comfortable dc'ing tube just yet Will dc with [facility] and see back in office next week  [name], MD [facility] [phone]

## 2023-06-06 NOTE — Progress Notes (Signed)
 Per order, every other staple removed from BLE incisions. Chest tube dressing changed prior to discharge. Sutures removed from old chest tube sites (X2).

## 2023-06-06 NOTE — Progress Notes (Signed)
 CARDIAC REHAB PHASE I    Pt ready for discharge home today. Postop [facility] education completed. Referral for CRP2 sent to [facility].   Ronny Colas, RN BSN 06/06/2023 8:57 AM

## 2023-06-06 NOTE — Progress Notes (Signed)
 [address]       Reginald [name] [address]             [phone]     HPI:  Reginald [name] is S/P CABG x 3 performed by Dr. Luna Salinas on 05/31/2023.  He required open saphenous vein harvest.  Hospital course also complicated by right pneumothorax with persistent air leak.  He was discharged home with chest tube in place connected to [facility].  He presents today for 1 week hospital follow up with CXR for possible chest tube removal.  Overall patient is doing well.  He states he is not sleeping very well due to night sweats, and the pain at his chest tube site.  He also states he had developed swelling after he left the hospital.  He contacted our office and has been on Lasix  with improvement in swelling.  He remains smoke free since 5/6 and he was commended and encouraged to continue on this path.   No current facility-administered medications for this visit.   Current Outpatient Medications  Medication Sig Dispense Refill   aspirin  EC 81 MG tablet Take 1 tablet (81 mg total) by mouth daily. Swallow whole.     clopidogrel  (PLAVIX ) 75 MG tablet Take 1 tablet (75 mg total) by mouth daily. 30 tablet 1   empagliflozin  (JARDIANCE ) 10 MG TABS tablet Take 1 tablet (10 mg total) by mouth daily. 30 tablet 1   gabapentin  (NEURONTIN ) 300 MG capsule Take 1 capsule (300 mg total) by mouth 2 (two) times daily. 60 capsule 0   losartan  (COZAAR ) 25 MG tablet Take 1 tablet (25 mg total) by mouth daily. 30 tablet 1   metoprolol  succinate (TOPROL -XL) 50 MG 24 hr tablet Take 1 tablet (50 mg total) by mouth at bedtime. Take with or immediately following a meal. 30 tablet 1   nicotine  (NICODERM CQ  - DOSED IN MG/24 HOURS) 21 mg/24hr patch Place 1 patch (21 mg total) onto the skin daily. 28 patch 0   nicotine  polacrilex (NICORETTE ) 2 MG gum Take 1 each (2 mg total) by mouth as needed for smoking cessation. 100 tablet 0   oxyCODONE  (OXY IR/ROXICODONE ) 5 MG immediate release tablet Take 1 tablet (5 mg  total) by mouth every 6 (six) hours as needed for severe pain (pain score 7-10). 28 tablet 0   rosuvastatin  (CRESTOR ) 20 MG tablet Take 1 tablet (20 mg total) by mouth daily. 30 tablet 1   spironolactone  (ALDACTONE ) 25 MG tablet Take 0.5 tablets (12.5 mg total) by mouth daily. 30 tablet 1   Facility-Administered Medications Ordered in Other Visits  Medication Dose Route Frequency Provider Last Rate Last Admin   alum & mag hydroxide-simeth (MAALOX/MYLANTA) 200-200-20 MG/5ML suspension 15 mL  15 mL Oral Q6H PRN [name], MD       aspirin  EC tablet 81 mg  81 mg Oral Daily Randa Burton, PA-C   81 mg at 06/06/23 9147   bisacodyl  (DULCOLAX) EC tablet 10 mg  10 mg Oral Daily Hendrickson, Steven C, MD   10 mg at 06/04/23 8295   Or   bisacodyl  (DULCOLAX) suppository 10 mg  10 mg Rectal Daily [name], MD       chlorhexidine  (PERIDEX ) 0.12 % solution 15 mL  15 mL Mouth/Throat BID [name], MD   15 mL at 06/06/23 0841   clopidogrel  (PLAVIX ) tablet 75 mg  75 mg Oral Daily Randa Burton, PA-C   75  mg at 06/06/23 0839   [facility] Family Education   Does not apply Once [name], MD       dicyclomine  (BENTYL ) capsule 10 mg  10 mg Oral Q12H PRN [name], MD   10 mg at 05/29/23 2235   diphenoxylate -atropine  (LOMOTIL ) 2.5-0.025 MG per tablet 1 tablet  1 tablet Oral QID PRN [name], MD   1 tablet at 05/29/23 2235   empagliflozin  (JARDIANCE ) tablet 10 mg  10 mg Oral Daily Thukkani, Arun K, MD   10 mg at 06/06/23 9604   enoxaparin  (LOVENOX ) injection 40 mg  40 mg Subcutaneous QHS [name], MD   40 mg at 06/05/23 2236   gabapentin  (NEURONTIN ) capsule 300 mg  300 mg Oral BID Randa Burton, PA-C   300 mg at 06/06/23 5409   ipratropium (ATROVENT ) nebulizer solution 0.5 mg  0.5 mg Nebulization Q6H PRN [name], MD       levalbuterol  (XOPENEX ) nebulizer solution 0.63 mg  0.63 mg  Nebulization Q6H PRN [name], MD       lidocaine  (LIDODERM ) 5 % 1 patch  1 patch Transdermal Q24H [name], MD   1 patch at 06/05/23 2239   losartan  (COZAAR ) tablet 25 mg  25 mg Oral Daily Thukkani, Arun K, MD   25 mg at 06/06/23 8119   magnesium  hydroxide (MILK OF MAGNESIA) suspension 30 mL  30 mL Oral Daily PRN [name], MD       magnesium  oxide (MAG-OX) tablet 400 mg  400 mg Oral Daily [name], MD   400 mg at 06/06/23 1478   metoprolol  succinate (TOPROL -XL) 24 hr tablet 50 mg  50 mg Oral QHS Stehler, Bailey C, PA-C   50 mg at 06/05/23 2236   metoprolol  tartrate (LOPRESSOR ) injection 2.5-5 mg  2.5-5 mg Intravenous Q2H PRN [name], MD   5 mg at 06/02/23 0004   nicotine  (NICODERM CQ  - dosed in mg/24 hours) patch 21 mg  21 mg Transdermal Daily [name], MD   21 mg at 06/06/23 2956   nicotine  polacrilex (NICORETTE ) gum 2 mg  2 mg Oral PRN Hendrickson, Steven C, MD   2 mg at 05/29/23 1045   ondansetron  (ZOFRAN ) injection 4 mg  4 mg Intravenous Q6H PRN [name], MD   4 mg at 06/03/23 2222   oxyCODONE  (Oxy IR/ROXICODONE ) immediate release tablet 5-10 mg  5-10 mg Oral Q3H PRN [name], MD   10 mg at 06/06/23 0157   pantoprazole  (PROTONIX ) EC tablet 40 mg  40 mg Oral Daily [name], MD   40 mg at 06/06/23 2130   prochlorperazine  (COMPAZINE ) injection 10 mg  10 mg Intravenous Q6H PRN Debroah Fanning C, PA-C   10 mg at 06/03/23 1843   rosuvastatin  (CRESTOR ) tablet 20 mg  20 mg Oral Daily [name], MD   20 mg at 06/06/23 8657   sodium chloride  flush (NS) 0.9 % injection 3 mL  3 mL Intravenous Q12H [name], MD   3 mL at 06/05/23 8469   sodium chloride  flush (NS) 0.9 % injection 3 mL  3 mL Intravenous PRN [name], MD       spironolactone  (ALDACTONE ) tablet 12.5 mg  12.5 mg Oral Daily Johnie Nailer B, NP   12.5 mg at 06/06/23 0839   sucralfate   (CARAFATE ) tablet 1 g  1 g Oral  TID WC & HS [name], MD   1 g at 06/06/23 1478   traMADol  (ULTRAM ) tablet 50 mg  50 mg Oral Q4H PRN Debroah Fanning C, PA-C   50 mg at 06/06/23 2956   zolpidem  (AMBIEN ) tablet 5 mg  5 mg Oral QHS PRN [name], MD        Physical Exam: BP 127/82   Pulse 98   Resp 18   Ht 5\' 8"  (1.727 m)   Wt 128 lb (58.1 kg)   SpO2 92%   BMI 19.46 kg/m   Gen: NAD, Thin male Heart: RRR Lungs: CTA bilaterally Ext: + mild pitting edema Incisions: sternotomy well healed, EVH sites with staples in place, erythema present... hematoma along both incisions in R/L Upper Thigh  Pigtail catheter in place to [facility]: no air leak present  Diagnostic Tests:  CXR: no pneumothorax appreciated   A/P:  S/P CABG x 3 performed 5/9 by Dr. Luna Salinas Right sided pneumothorax- persistent air leak, d/c from hospital on 5/15 with pigtail catheter in place... CXR today showed no pneumothorax, no air leak.. I personally removed chest tube Early Cellulitis along EVH sites, staples removed without difficulty.. will give a course of Bactrim DS x 7 Pulmonary Nodule-concerning for Malignancy.. he will require follow up scan in August, if not sooner Insomnia, Night Sweats-- recommended patient try Melatonin... night sweats could be related to pulmonary nodule RTC with [facility] on 6/17 or sooner if [facility] sites dont improve  Repeat CXR obtained and showed: no evidence of pneumothorax  Gates Kasal, PA-C [facility] [phone]

## 2023-06-06 NOTE — TOC Transition Note (Signed)
 Transition of Care (TOC) - Discharge Note Sherin Dingwall RN, BSN Transitions of Care Unit 4E- RN Case Manager See Treatment Team for direct phone #   Patient Details  Name: Reginald [name] MRN: [id number] Date of Birth: Jul 29, 1965  Transition of Care [facility]) CM/SW Contact:  Rox Cope, RN Phone Number: 06/06/2023, 10:21 AM   Clinical Narrative:    Pt stable for transition home today, [facility] order placed for Chest Tube- mini express care.   CM spoke with pt at bedside- SO other present as well. Bedside RN at bedside doing drsg change to CT site as well as providing education on mini express care.   Discussed with pt HHRN order- pt voiced he does not feel he will need HH follow up as his SO has medical back ground and will assist him. He also voiced he is hopeful that he will only have CT another week. He is to follow up with TCTS next week and hopes to get tube removed. Pt declined HH referral at this time. Pt voiced understanding to call TCTS office if he has any trouble or concerns about his chest tube or the mini express.   SO to transport home.   No other TOC needs noted   Final next level of care: Home/Self Care Barriers to Discharge: No Barriers Identified   Patient Goals and CMS Choice Patient states their goals for this hospitalization and ongoing recovery are:: return home to family CMS Medicare.gov Compare Post Acute Care list provided to:: Patient Choice offered to / list presented to : Patient      Discharge Placement               Home        Discharge Plan and Services Additional resources added to the After Visit Summary for     Discharge Planning Services: CM Consult Post Acute Care Choice: Home Health          DME Arranged: N/A DME Agency: NA       HH Arranged: RN, Patient Refused HH HH Agency: NA        Social Drivers of Health (SDOH) Interventions SDOH Screenings   Food Insecurity: Patient Declined (05/28/2023)  Housing: Unknown  (06/05/2023)  Transportation Needs: No Transportation Needs (06/05/2023)  Utilities: Patient Declined (05/28/2023)  Alcohol Screen: Low Risk  (06/05/2023)  Financial Resource Strain: Low Risk  (06/05/2023)  Physical Activity: Sufficiently Active (03/20/2023)   Received from [facility]  Social Connections: Moderately Integrated (03/20/2023)   Received from [facility]  Stress: No Stress Concern Present (03/20/2023)   Received from [facility]  Tobacco Use: Medium Risk (06/05/2023)  Health Literacy: Low Risk  (03/20/2023)   Received from [facility]     Readmission Risk Interventions    06/06/2023   10:21 AM  Readmission Risk Prevention Plan  Transportation Screening Complete  PCP or Specialist Appt within 5-7 Days Complete  Home Care Screening Complete  Medication Review (RN CM) Complete

## 2023-06-06 NOTE — Plan of Care (Signed)
 ?  Problem: Clinical Measurements: ?Goal: Will remain free from infection ?Outcome: Progressing ?  ?

## 2023-06-06 NOTE — Evaluation (Signed)
 Occupational Therapy Evaluation and Discharge Patient Details Name: Reginald [name] MRN: [id number] DOB: 02-23-1965 Today's Date: 06/06/2023   History of Present Illness   Pt is a 58 y.o. male who presented 05/28/23 with intermittent CP. S/p L heart cath and coronary angiography 5/7, which showed 3-vessel obstructive CAD. S/p CABGx3 5/9. Extubated 5/10. PMH: arthritis, GERD, IBS     Clinical Impressions Pt is functioning at a supervision to min assist level. Educated in energy conservation strategies and sternal precautions related to ADLs and IADLs. Pt and wife verbalized understanding. Pt is returning home with chest tube, will be sponge bathing initially. No further OT needs. Pt is eager to return home.      If plan is discharge home, recommend the following:   A little help with bathing/dressing/bathroom;Assistance with cooking/housework;Assist for transportation;Help with stairs or ramp for entrance     Functional Status Assessment   Patient has had a recent decline in their functional status and demonstrates the ability to make significant improvements in function in a reasonable and predictable amount of time.     Equipment Recommendations   None recommended by OT     Recommendations for Other Services         Precautions/Restrictions   Precautions Precautions: Sternal Precaution Booklet Issued: Yes (comment) Recall of Precautions/Restrictions: Impaired Precaution/Restrictions Comments: reviewed sternal precautions related to mobility, ADLs and IADLs Restrictions Weight Bearing Restrictions Per Provider Order: No     Mobility Bed Mobility Overal bed mobility: Modified Independent                  Transfers Overall transfer level: Modified independent Equipment used: None                      Balance Overall balance assessment: Mild deficits observed, not formally tested                                         ADL  either performed or assessed with clinical judgement   ADL Overall ADL's : Needs assistance/impaired Eating/Feeding: Independent   Grooming: Standing;Supervision/safety   Upper Body Bathing: Minimal assistance;Sitting   Lower Body Bathing: Supervison/ safety;Sit to/from stand   Upper Body Dressing : Set up;Sitting   Lower Body Dressing: Supervision/safety;Sit to/from stand   Toilet Transfer: Supervision/safety           Functional mobility during ADLs: Supervision/safety       Vision Ability to See in Adequate Light: 0 Adequate Patient Visual Report: No change from baseline       Perception         Praxis         Pertinent Vitals/Pain Pain Assessment Pain Assessment: No/denies pain     Extremity/Trunk Assessment         Cervical / Trunk Assessment Cervical / Trunk Assessment: Normal   Communication     Cognition Arousal: Alert Behavior During Therapy: WFL for tasks assessed/performed Cognition: No apparent impairments                               Following commands: Intact       Cueing  General Comments   Cueing Techniques: Verbal cues      Exercises     Shoulder Instructions      Home Living Family/patient expects to be discharged  to:: Private residence Living Arrangements: Spouse/significant other;Children Available Help at Discharge: Family;Available 24 hours/day Type of Home: Mobile home Home Access: Stairs to enter Entrance Stairs-Number of Steps: 3 Entrance Stairs-Rails: Can reach both Home Layout: One level     Bathroom Shower/Tub: Chief Strategy Officer: Handicapped height     Home Equipment: Agricultural consultant (2 wheels);Cane - single point;BSC/3in1;Shower seat;Wheelchair - manual          Prior Functioning/Environment Prior Level of Function : Independent/Modified Independent;Working/employed;Driving                    OT Problem List:     OT Treatment/Interventions:        OT  Goals(Current goals can be found in the care plan section)       OT Frequency:       Co-evaluation              AM-PAC OT "6 Clicks" Daily Activity     Outcome Measure Help from another person eating meals?: None Help from another person taking care of personal grooming?: None Help from another person toileting, which includes using toliet, bedpan, or urinal?: None Help from another person bathing (including washing, rinsing, drying)?: A Little Help from another person to put on and taking off regular upper body clothing?: None Help from another person to put on and taking off regular lower body clothing?: None 6 Click Score: 23   End of Session    Activity Tolerance: Patient tolerated treatment well Patient left: in bed;with call bell/phone within reach;with family/visitor present  OT Visit Diagnosis: Other (comment) (decreased activity tolerance)                Time: 1610-9604 OT Time Calculation (min): 18 min Charges:  OT General Charges $OT Visit: 1 Visit OT Evaluation $OT Eval Low Complexity: 1 Low  Avanell Leigh, OTR/L Acute Rehabilitation Services Office: [phone]   Jonette Nestle 06/06/2023, 9:46 AM

## 2023-06-06 NOTE — Progress Notes (Signed)
   Heart Failure Stewardship Pharmacist Progress Note   PCP: Cynda Drafts, FNP PCP-Cardiologist: Lasalle Pointer, MD    HPI:  58 yo M with PMH of arthritis, GERD, tobacco use, and IBS.   Presented to the ED on 5/6 with chest pain radiating to his arm. Troponin 17>15>13. EKG with subtle ST elevations and diffuse ST depressions. CT chest with incidental finding of 1cm pulmonary nodule in R apex. Pulmonary consulted and recommended outpatient PFT and PET scan. CXR with no edema or effusion. ECHO 5/7 with LVEF 35-40%, RWMA, G1DD, RV normal. Taken for [facility] on 5/7 and found to have 3 vessel obstructive CAD. CT surgery consulted and underwent CABG x 3 on 5/9. 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 Vascular surgery consulted and no interventions recommended.   Met with patient and his wife at bedside. No shortness of breath. Minimal LE edema but more attributed to surgery. All medications reviewed and education complete. Questions and concerns answered and addressed. He has a pill box at home that his wife helps set up. Has a BP cuff and scale.   Discharge HF Medications: Beta Blocker: metoprolol  XL 50 mg daily ACE/ARB/ARNI: losartan 25 mg daily MRA: spironolactone 12.5 mg daily SGLT2i: Jardiance 10 mg daily  Prior to admission HF Medications: None  Pertinent Lab Values: Serum creatinine 0.85, BUN 14, Potassium 3.9, Sodium 137, BNP 175, Magnesium  1.7, A1c 5.4   Vital Signs: Weight: 125 lbs (admission weight: 132 lbs) Blood pressure: 110/70s  Heart rate: 90-100s  I/O: net -2 yesterday; net +0.7L since admission  Medication Assistance / Insurance Benefits Check: Does the patient have prescription insurance?  Yes Type of insurance plan: Cigna  Outpatient Pharmacy:  [facility] Is the patient willing to use [facility] TOC pharmacy at discharge? Yes Is the patient willing to transition their outpatient pharmacy to utilize a [facility] outpatient pharmacy?   No     Assessment: 1. Acute systolic CHF (LVEF 35-40%), due to ICM s/p CABG 5/9. NYHA class II symptoms. - Volume status good. No lasix yet. Patient and his wife educated on signs/symptoms of fluid and when to call the doctor.   - Continue metoprolol  XL 50 mg daily - On losartan 25 mg daily - last dose of lisinopril was given on 5/11 (did not get dose on 5/12 due to vomiting). Consider transitioning to Entresto 24/26 mg BID - Continue spironolactone 12.5 mg daily - Continue Jardiance 10 mg daily   Plan: 1) Medication changes recommended at this time: - Stop losartan and start Entresto 24/26 mg BID  2) Patient assistance: Viola Greulich copay [phone] - copay card lowers to $10 - Farxiga copay $117 - copay card lowers to $0 - Jardiance copay $123 - copay card lowers to $10 - Copay card for Jardiance provided on 5/15  3)  Education  - Patient has been educated on current HF medications and potential additions to HF medication regimen - Patient verbalizes understanding that over the next few months, these medication doses may change and more medications may be added to optimize HF regimen - Patient has been educated on basic disease state pathophysiology and goals of therapy   Jerilyn Monte, PharmD, BCPS Heart Failure Stewardship Pharmacist Phone [phone]

## 2023-06-06 NOTE — Progress Notes (Signed)
 Reginald [name] to be D/C'd Home per MD order.  Discussed with the patient and all questions fully answered.  VSS, Skin clean, dry and intact without evidence of skin break down, no evidence of skin tears noted. IV catheter discontinued intact. Site without signs and symptoms of complications. Dressing and pressure applied.  Telemetry monitor removed.  An After Visit Summary was printed and given to the patient. Patient received prescription.  D/c education completed with patient/family including follow up instructions, medication list, d/c activities limitations if indicated, with other d/c instructions as indicated by MD - patient able to verbalize understanding, all questions fully answered.   Patient instructed to return to ED, call 911, or call MD for any changes in condition.   Patient escorted via WC, and D/C home via private auto.  [name] 06/06/2023 10:51 AM

## 2023-06-08 ENCOUNTER — Emergency Department (HOSPITAL_COMMUNITY)
Admission: EM | Admit: 2023-06-08 | Discharge: 2023-06-08 | Disposition: A | Discharge status: Home / Self Care | Attending: Emergency Medicine | Admitting: Emergency Medicine

## 2023-06-08 ENCOUNTER — Other Ambulatory Visit: Payer: Self-pay

## 2023-06-08 ENCOUNTER — Encounter (HOSPITAL_COMMUNITY): Payer: Self-pay

## 2023-06-08 DIAGNOSIS — R000 Tachycardia, unspecified: Secondary | ICD-10-CM | POA: Insufficient documentation

## 2023-06-08 DIAGNOSIS — R2240 Localized swelling, mass and lump, unspecified lower limb: Secondary | ICD-10-CM | POA: Diagnosis present

## 2023-06-08 DIAGNOSIS — Z5321 Procedure and treatment not carried out due to patient leaving prior to being seen by health care provider: Secondary | ICD-10-CM | POA: Insufficient documentation

## 2023-06-08 NOTE — ED Notes (Signed)
Got patient into a gown on the monitor did EKG shown to er provider patient is resting with call bell in reach and family at bedside

## 2023-06-08 NOTE — ED Triage Notes (Signed)
 Pt BIB POV d/t L leg swelling after triple bypass occurred 3 days ago. No signs of infection. No drainage. Afebrile.  No other symptoms. A&O X4.

## 2023-06-08 NOTE — ED Provider Notes (Signed)
 Upon arrival to the room the patient's gown is sitting on the bed and he is no longer in the room.  Staff states that the patient walked out independently.  Patient eloped from the hospital prior to any medical/physical evaluation by myself.  Vitals showed tachycardia of 101 but otherwise stable.   Flonnie Humphrey, DO 06/08/23 9604

## 2023-06-09 ENCOUNTER — Other Ambulatory Visit: Payer: Self-pay | Admitting: Surgery

## 2023-06-09 MED ORDER — POTASSIUM CHLORIDE CRYS ER 20 MEQ PO TBCR
20.0000 meq | EXTENDED_RELEASE_TABLET | Freq: Every day | ORAL | 1 refills | Status: DC
Start: 2023-06-09 — End: 2023-06-14

## 2023-06-09 MED ORDER — FUROSEMIDE 40 MG PO TABS: 40.0000 mg | ORAL_TABLET | Freq: Every day | ORAL | 1 refills | Status: DC

## 2023-06-09 NOTE — Progress Notes (Signed)
 TCTS   The patient's wife called in today reporting that he has had increased swelling in both lower extremities.  He has not noticed any shortness of breath or chest discomfort.  He has the expected incisional pain for which she is taking pain medicine.  He underwent coronary bypass surgery x 3 by Dr. Luna Salinas on 05/31/2023.  They went to the emergency room yesterday but left before being treated since they did not feel that his needs are being addressed.  He has normal renal function.  He was on Lasix  prior to discharge but was not sent home on a Lasix .  [facility] [place] [facility]  40 mg daily x 7 days and potassium chloride  20 mill equivalents daily for 7 days.  I told him to weigh himself daily and call our office if he does not notice that his weight is decreasing along with the swelling.  Bartley Lightning, MD

## 2023-06-10 ENCOUNTER — Other Ambulatory Visit: Payer: Self-pay | Admitting: Thoracic Surgery (Cardiothoracic Vascular Surgery)

## 2023-06-10 DIAGNOSIS — Z951 Presence of aortocoronary bypass graft: Secondary | ICD-10-CM

## 2023-06-12 ENCOUNTER — Ambulatory Visit (HOSPITAL_COMMUNITY)
Admission: RE | Admit: 2023-06-12 | Discharge: 2023-06-12 | Disposition: A | Discharge status: Home / Self Care | Source: Ambulatory Visit | Attending: Cardiovascular Disease | Admitting: Cardiovascular Disease

## 2023-06-12 ENCOUNTER — Ambulatory Visit: Attending: Thoracic Surgery (Cardiothoracic Vascular Surgery) | Admitting: Physician Assistant

## 2023-06-12 ENCOUNTER — Ambulatory Visit (HOSPITAL_COMMUNITY)
Admission: RE | Admit: 2023-06-12 | Discharge: 2023-06-12 | Disposition: A | Discharge status: Home / Self Care | Source: Ambulatory Visit | Attending: Physician Assistant | Admitting: Physician Assistant

## 2023-06-12 VITALS — BP 127/82 | HR 98 | Resp 18 | Ht 68.0 in | Wt 128.0 lb

## 2023-06-12 DIAGNOSIS — Z951 Presence of aortocoronary bypass graft: Secondary | ICD-10-CM

## 2023-06-12 DIAGNOSIS — J95811 Postprocedural pneumothorax: Secondary | ICD-10-CM

## 2023-06-12 MED ORDER — SULFAMETHOXAZOLE-TRIMETHOPRIM 800-160 MG PO TABS: 1.0000 | ORAL_TABLET | Freq: Two times a day (BID) | ORAL | 0 refills | Status: DC

## 2023-06-13 NOTE — Progress Notes (Signed)
 HEART & VASCULAR TRANSITION OF CARE CONSULT NOTE     Referring Physician:Dr Aurelio Leer, Ariel Begun, FNP   Chief Complaint: Heart Failure   HPI: Referred to clinic by Dr Luna Salinas for heart failure consultation.   Reginald [name] is a 58 y.o. male with a history of CABG x3  05/31/2023, tobacco abuse, pulmonary nodule, and HFrEF.   Admitted 05/28/23 with unstable angina.  Echo EF 35-40%. S/P CABG x3. He was discharged with mini chest tube. Discharged 06/06/23   After discharge he developed lower extremity edema and went to ED on 06/08/23. He was given IV lasix  but eloped  from the hospital. He called CT Surgery and Dr Sherene Dilling started lasix  40 mg daily x 7 days.   He is here today with his wife. Overall feeling much better. Denies SOB/PND/Orthopnea. Gets occasional discomfort from incisions. Appetite ok. No fever or chills. Weight at home stable. Taking all medications. No longer smokes. Lives with his wife and 2 sons.     Past Medical History:  Diagnosis Date   Arthritis    GERD (gastroesophageal reflux disease)    Headache    migraines    IBS (irritable bowel syndrome)     Current Outpatient Medications  Medication Sig Dispense Refill   aspirin  EC 81 MG tablet Take 1 tablet (81 mg total) by mouth daily. Swallow whole.     clopidogrel  (PLAVIX ) 75 MG tablet Take 1 tablet (75 mg total) by mouth daily. 30 tablet 1   dicyclomine  (BENTYL ) 10 MG capsule TAKE 1 CAPSULE (10 MG TOTAL) BY MOUTH EVERY 12 (TWELVE) HOURS AS NEEDED (ABDOMINAL PAIN). 180 capsule 1   diphenoxylate -atropine  (LOMOTIL ) 2.5-0.025 MG tablet TAKE 1 TABLET BY MOUTH 4 (FOUR) TIMES DAILY AS NEEDED FOR DIARRHEA OR LOOSE STOOLS. 180 tablet 1   empagliflozin  (JARDIANCE ) 10 MG TABS tablet Take 1 tablet (10 mg total) by mouth daily. 30 tablet 1   gabapentin  (NEURONTIN ) 300 MG capsule Take 1 capsule (300 mg total) by mouth 2 (two) times daily. 60 capsule 0   losartan  (COZAAR ) 25 MG tablet Take 1 tablet (25 mg total) by  mouth daily. 30 tablet 1   MAGNESIUM  PO Take 1 tablet by mouth at bedtime.     metoprolol  succinate (TOPROL -XL) 50 MG 24 hr tablet Take 1 tablet (50 mg total) by mouth at bedtime. Take with or immediately following a meal. 30 tablet 1   Multiple Vitamin (MULTIVITAMIN) tablet Take 1 tablet by mouth at bedtime.     nicotine  (NICODERM CQ  - DOSED IN MG/24 HOURS) 21 mg/24hr patch Place 1 patch (21 mg total) onto the skin daily. 28 patch 0   nicotine  polacrilex (NICORETTE ) 2 MG gum Take 1 each (2 mg total) by mouth as needed for smoking cessation. 100 tablet 0   ondansetron  (ZOFRAN ) 4 MG tablet TAKE 1 TABLET BY MOUTH EVERY 8 HOURS AS NEEDED FOR NAUSEA AND VOMITING 90 tablet 3   oxyCODONE  (OXY IR/ROXICODONE ) 5 MG immediate release tablet Take 1 tablet (5 mg total) by mouth every 6 (six) hours as needed for severe pain (pain score 7-10). 28 tablet 0   promethazine  (PHENERGAN ) 12.5 MG tablet TAKE 1 TABLET BY MOUTH EVERY 6 HOURS AS NEEDED FOR NAUSEA OR VOMITING. 30 tablet 0   rosuvastatin  (CRESTOR ) 20 MG tablet Take 1 tablet (20 mg total) by mouth daily. 30 tablet 1   sucralfate  (CARAFATE ) 1 g tablet Take 1 tablet (1 g total) by mouth 4 (four) times daily -  with meals and at bedtime. Dissolve tablet in 30ml of water, then drink slurry 120 tablet 1   sulfamethoxazole-trimethoprim (BACTRIM DS) 800-160 MG tablet Take 1 tablet by mouth 2 (two) times daily. 14 tablet 0   furosemide  (LASIX ) 40 MG tablet Take 1 tablet (40 mg total) by mouth daily. 30 tablet 3   potassium chloride  SA (KLOR-CON  M) 20 MEQ tablet Take 1 tablet (20 mEq total) by mouth daily. 30 tablet 1   spironolactone  (ALDACTONE ) 25 MG tablet Take 1 tablet (25 mg total) by mouth daily. 30 tablet 1   No current facility-administered medications for this encounter.    Allergies  Allergen Reactions   Penicillins Rash    PCN: Immediate rash, facial/tongue/throat swelling, SOB or lightheadedness with hypotension.   (He has received Ceftriaxone  IM on  06/10/2017 without adverse reaction /none reported).       Social History   Socioeconomic History   Marital status: Significant Other    Spouse name: Beth   Number of children: 2   Years of education: Not on file   Highest education level: GED or equivalent  Occupational History   Not on file  Tobacco Use   Smoking status: Former    Types: Cigarettes    Passive exposure: Current   Smokeless tobacco: Former  Building services engineer status: Never Used  Substance and Sexual Activity   Alcohol use: No    Alcohol/week: 0.0 standard drinks of alcohol    Comment: formerly a heavy drinker, quit 2002   Drug use: No   Sexual activity: Not on file  Other Topics Concern   Not on file  Social History Narrative   Not on file   Social Drivers of Health   Financial Resource Strain: Low Risk  (06/05/2023)   Overall Financial Resource Strain (CARDIA)    Difficulty of Paying Living Expenses: Not very hard  Food Insecurity: Patient Declined (05/28/2023)   Hunger Vital Sign    Worried About Running Out of Food in the Last Year: Patient declined    Ran Out of Food in the Last Year: Patient declined  Transportation Needs: No Transportation Needs (06/05/2023)   PRAPARE - Administrator, Civil Service (Medical): No    Lack of Transportation (Non-Medical): No  Physical Activity: Sufficiently Active (03/20/2023)   Received from [facility]   Exercise Vital Sign    Days of Exercise per Week: 7 days    Minutes of Exercise per Session: 30 min  Stress: No Stress Concern Present (03/20/2023)   Received from [facility] of Occupational Health - Occupational Stress Questionnaire    Feeling of Stress : Only a little  Social Connections: Moderately Integrated (03/20/2023)   Received from [facility]   Social Connection and Isolation Panel [NHANES]    Frequency of Communication with Friends and Family: More than three times a week    Frequency of Social Gatherings  with Friends and Family: Never    Attends Religious Services: More than 4 times per year    Active Member of [employer] or Organizations: No    Attends Banker Meetings: Never    Marital Status: Living with partner  Intimate Partner Violence: Not At Risk (05/28/2023)   Humiliation, Afraid, Rape, and Kick questionnaire    Fear of Current or Ex-Partner: No    Emotionally Abused: No    Physically Abused: No    Sexually Abused: No  Family History  Problem Relation Age of Onset   Diabetes Mother    Osteoarthritis Mother    Heart disease Father     Vitals:   06/14/23 0931  BP: 118/71  Pulse: 100  SpO2: 97%  Weight: 57.3 kg (126 lb 6.4 oz)   Wt Readings from Last 3 Encounters:  06/14/23 57.3 kg (126 lb 6.4 oz)  06/12/23 58.1 kg (128 lb)  06/06/23 56.7 kg (125 lb)    PHYSICAL EXAM: General:   No resp difficulty Neck: supple. no JVD.  Cor: PMI nondisplaced. Regular rate & rhythm. No rubs, gallops or murmurs. Lungs: clear Abdomen: soft, nontender, nondistended.  Extremities: no cyanosis, clubbing, rash, LLE trace RLE no edema Neuro: alert & oriented x3   ASSESSMENT & PLAN: 1. Chronic HFrEF, ICM Echo 35-40%. Plan to repeat ECHO in 3-4 months.  NYHA II GDMT  Diuretic- Appears euvolemic. Continue lasix  40 mg daily  + 20 meq K  BB-m Toprol  50 mg daily  Ace/ARB/ARNI- Continue losartan  25 mg daily  MRA- Increase spiro 25 mg daily  SGLT2i- Continue jardiance  10 mg daily  Repeat Echo in 3 months  Check BMET today and in 7 days.   2. CAD--->S/P CABG x3  He has follow up with Dr Luna Salinas.  No chest pain. On statin + toprol  xl  Repeat lipid panel in a couple of months.   3. Tobacco Abuse Has not had a cigarette since 05/28/23. Congratulated.   Referred to HFSW (PCP, Medications, Transportation, ETOH Abuse, Drug Abuse, Insurance, Financial ): No Refer to Pharmacy: No Refer to Home Health:  No Refer to Advanced Heart Failure Clinic: no  Refer to General  Cardiology: He has follow up with    Follow up as needed.   Teylor Wolven NP-C 10:23 AM

## 2023-06-14 ENCOUNTER — Other Ambulatory Visit (HOSPITAL_COMMUNITY): Payer: Self-pay

## 2023-06-14 ENCOUNTER — Ambulatory Visit (HOSPITAL_COMMUNITY): Payer: Self-pay | Admitting: Adult Health

## 2023-06-14 ENCOUNTER — Ambulatory Visit (HOSPITAL_COMMUNITY)
Admit: 2023-06-14 | Discharge: 2023-06-14 | Disposition: A | Discharge status: Home / Self Care | Attending: Adult Health | Admitting: Adult Health

## 2023-06-14 VITALS — BP 118/71 | HR 100 | Wt 126.4 lb

## 2023-06-14 DIAGNOSIS — Z7984 Long term (current) use of oral hypoglycemic drugs: Secondary | ICD-10-CM | POA: Insufficient documentation

## 2023-06-14 DIAGNOSIS — Z79899 Other long term (current) drug therapy: Secondary | ICD-10-CM | POA: Insufficient documentation

## 2023-06-14 DIAGNOSIS — I255 Ischemic cardiomyopathy: Secondary | ICD-10-CM | POA: Diagnosis not present

## 2023-06-14 DIAGNOSIS — Z951 Presence of aortocoronary bypass graft: Secondary | ICD-10-CM | POA: Diagnosis not present

## 2023-06-14 DIAGNOSIS — Z87891 Personal history of nicotine dependence: Secondary | ICD-10-CM | POA: Diagnosis not present

## 2023-06-14 DIAGNOSIS — I2510 Atherosclerotic heart disease of native coronary artery without angina pectoris: Secondary | ICD-10-CM | POA: Diagnosis not present

## 2023-06-14 DIAGNOSIS — I5022 Chronic systolic (congestive) heart failure: Secondary | ICD-10-CM | POA: Insufficient documentation

## 2023-06-14 DIAGNOSIS — Z720 Tobacco use: Secondary | ICD-10-CM

## 2023-06-14 LAB — BRAIN NATRIURETIC PEPTIDE: B Natriuretic Peptide: 297.7 pg/mL — ABNORMAL HIGH (ref 0.0–100.0)

## 2023-06-14 LAB — BASIC METABOLIC PANEL WITH GFR
Anion gap: 6 (ref 5–15)
BUN: 10 mg/dL (ref 6–20)
CO2: 26 mmol/L (ref 22–32)
Calcium: 8.5 mg/dL — ABNORMAL LOW (ref 8.9–10.3)
Chloride: 103 mmol/L (ref 98–111)
Creatinine, Ser: 1.02 mg/dL (ref 0.61–1.24)
GFR, Estimated: >60 mL/min (ref >60)
Glucose, Bld: 109 mg/dL — ABNORMAL HIGH (ref 70–99)
Potassium: 4.6 mmol/L (ref 3.5–5.1)
Sodium: 135 mmol/L (ref 135–145)

## 2023-06-14 MED ORDER — FUROSEMIDE 40 MG PO TABS
40.0000 mg | ORAL_TABLET | Freq: Every day | ORAL | 3 refills | Status: AC
Start: 2023-06-14 — End: ?

## 2023-06-14 MED ORDER — POTASSIUM CHLORIDE CRYS ER 20 MEQ PO TBCR: 20.0000 meq | EXTENDED_RELEASE_TABLET | Freq: Every day | ORAL | 1 refills | Status: DC

## 2023-06-14 MED ORDER — SPIRONOLACTONE 25 MG PO TABS: 25.0000 mg | ORAL_TABLET | Freq: Every day | ORAL | 1 refills | Status: DC

## 2023-06-14 NOTE — Patient Instructions (Signed)
 Medication Changes:  INCREASE SPIRONOLACTONE  TO 25MG  ONCE DAILY   CONTINUE FUROSEMIDE  40MG  ONCE DAILY   CONTINUE POTASSIUM 20MEQ ONCE DAILY   Lab Work:  Labs done today, your results will be available in MyChart, we will contact you for abnormal readings.  Follow-Up in: AS NEEDED   At the Advanced Heart Failure Clinic, you and your health needs are our priority. We have a designated team specialized in the treatment of Heart Failure. This Care Team includes your primary Heart Failure Specialized Cardiologist (physician), Advanced Practice Providers (APPs- Physician Assistants and Nurse Practitioners), and Pharmacist who all work together to provide you with the care you need, when you need it.   You may see any of the following providers on your designated Care Team at your next follow up:  Dr. Jules Oar Dr. Peder Bourdon Dr. Alwin Baars Dr. Judyth Nunnery Nieves Bars, NP Ruddy Corral, [place] [facility] [place], [place] Dennise Fitz, NP [place] Lee, NP Luster Salters, PharmD   Please be sure to bring in all your medications bottles to every appointment.   Need to Contact Us :  If you have any questions or concerns before your next appointment please send us  a message through [place] or call our office at [phone].    TO LEAVE A MESSAGE FOR THE NURSE SELECT OPTION 2, PLEASE LEAVE A MESSAGE INCLUDING: YOUR NAME DATE OF BIRTH CALL BACK NUMBER REASON FOR CALL**this is important as we prioritize the call backs  YOU WILL RECEIVE A CALL BACK THE SAME DAY AS LONG AS YOU CALL BEFORE 4:00 PM

## 2023-06-18 ENCOUNTER — Encounter: Payer: Self-pay | Admitting: Thoracic Surgery (Cardiothoracic Vascular Surgery)

## 2023-06-18 ENCOUNTER — Telehealth: Payer: Self-pay | Admitting: *Deleted

## 2023-06-18 NOTE — Progress Notes (Unsigned)
 HPI: [name] is a 58 year old with past history of hypertension, gastroesophageal reflux disease and irritable bowel syndrome.  He was recently referred to us  when he presented with unstable angina and was found to have multivessel coronary artery disease.  He underwent three-vessel CABG by Dr. Luna Salinas on 05/31/2023 requiring open saphenous vein harvest due to extremely small and tenuous saphenous veins.  His postoperative course was notable for a right pneumothorax with a persistent airleak.  He was discharged home on 06/06/2023 with a pigtail catheter in place to an [facility] collection system.  He presented to the emergency room on 06/08/2023 for evaluation of "leg swelling" but left before being seen.  He contacted Dr. Sherene Dilling on 06/09/2023 guarding the lower extremity swelling.  Prescriptions were sent to his pharmacy for 7-day course of Lasix  and potassium which he has completed.  He was seen in our office on 06/12/2023 for scheduled follow-up with a chest x-ray.  Pneumothorax was completely resolved and there was no active air leak so the chest tube was removed at that visit along with all of the staples from the lower extremity vein harvest incisions.  His incisions are all healing well with no evidence of complication at that time.   He contacted our office on 06/18/2023 reporting a raised area at the vein harvest incision near the left knee.  He also had similar raised areas over the incisions in both groins but that has resolved over the last week.  He is otherwise feeling better each day and gaining strength.  He denies any angina or shortness of breath.   He was asked to come to the office today for wound check.    Current Outpatient Medications  Medication Sig Dispense Refill   aspirin  EC 81 MG tablet Take 1 tablet (81 mg total) by mouth daily. Swallow whole.     clopidogrel  (PLAVIX ) 75 MG tablet Take 1 tablet (75 mg total) by mouth daily. 30 tablet 1   dicyclomine  (BENTYL )  10 MG capsule TAKE 1 CAPSULE (10 MG TOTAL) BY MOUTH EVERY 12 (TWELVE) HOURS AS NEEDED (ABDOMINAL PAIN). 180 capsule 1   diphenoxylate -atropine  (LOMOTIL ) 2.5-0.025 MG tablet TAKE 1 TABLET BY MOUTH 4 (FOUR) TIMES DAILY AS NEEDED FOR DIARRHEA OR LOOSE STOOLS. 180 tablet 1   empagliflozin  (JARDIANCE ) 10 MG TABS tablet Take 1 tablet (10 mg total) by mouth daily. 30 tablet 1   furosemide  (LASIX ) 40 MG tablet Take 1 tablet (40 mg total) by mouth daily. 30 tablet 3   gabapentin  (NEURONTIN ) 300 MG capsule Take 1 capsule (300 mg total) by mouth 2 (two) times daily. 60 capsule 0   losartan  (COZAAR ) 25 MG tablet Take 1 tablet (25 mg total) by mouth daily. 30 tablet 1   MAGNESIUM  PO Take 1 tablet by mouth at bedtime.     metoprolol  succinate (TOPROL -XL) 50 MG 24 hr tablet Take 1 tablet (50 mg total) by mouth at bedtime. Take with or immediately following a meal. 30 tablet 1   Multiple Vitamin (MULTIVITAMIN) tablet Take 1 tablet by mouth at bedtime.     nicotine  (NICODERM CQ  - DOSED IN MG/24 HOURS) 21 mg/24hr patch Place 1 patch (21 mg total) onto the skin daily. 28 patch 0   nicotine  polacrilex (NICORETTE ) 2 MG gum Take 1 each (2 mg total) by mouth as needed for smoking cessation. 100 tablet 0   ondansetron  (ZOFRAN ) 4 MG tablet TAKE 1 TABLET BY MOUTH EVERY 8 HOURS AS NEEDED FOR NAUSEA AND  VOMITING 90 tablet 3   oxyCODONE  (OXY IR/ROXICODONE ) 5 MG immediate release tablet Take 1 tablet (5 mg total) by mouth every 6 (six) hours as needed for severe pain (pain score 7-10). 28 tablet 0   potassium chloride  SA (KLOR-CON  M) 20 MEQ tablet Take 1 tablet (20 mEq total) by mouth daily. 30 tablet 1   promethazine  (PHENERGAN ) 12.5 MG tablet TAKE 1 TABLET BY MOUTH EVERY 6 HOURS AS NEEDED FOR NAUSEA OR VOMITING. 30 tablet 0   rosuvastatin  (CRESTOR ) 20 MG tablet Take 1 tablet (20 mg total) by mouth daily. 30 tablet 1   spironolactone  (ALDACTONE ) 25 MG tablet Take 1 tablet (25 mg total) by mouth daily. 30 tablet 1   sucralfate   (CARAFATE ) 1 g tablet Take 1 tablet (1 g total) by mouth 4 (four) times daily -  with meals and at bedtime. Dissolve tablet in 30ml of water, then drink slurry 120 tablet 1   sulfamethoxazole-trimethoprim (BACTRIM DS) 800-160 MG tablet Take 1 tablet by mouth 2 (two) times daily. 14 tablet 0   No current facility-administered medications for this visit.    Physical Exam Vital signs BP 109/72 Heart rate 82 Respirations 18 SpO2 95% on room air  General: 58 year old male in no acute distress. Heart: Regular rate and rhythm Chest: Breath sounds are clear Extremities: Reginald [name] required several incisions on both lower extremities in order to find an adequate saphenous vein for his coronary bypass.  All staples have been removed.  The incisions are all intact and dry with no inflammation.  He has minimal induration at both groin incisions but no tenderness.  He has a prominently raised area over the medial left knee incision that is densely indurated.  Surprisingly, it is not at all tender.  There is no inflammation and no evidence of any drainage.  Site is not ballotable as would be expected with a seroma.     Diagnostic Tests: None today  Impression / Plan: Indurated mass at the left medial knee vein harvest incision.  This is dense and firm without tenderness suggesting a possible late-developing hematoma.  I do not think this is a seroma and the site does not appear to be infected.  He has just completed the course of Bactrim DS 1 p.o. twice daily for 7 days.  I do not see any indication to continue antibiotics or to attempt any kind of drainage procedure.  I think the site should be watched closely.  I've asked Reginald [name] to return in 1 week or call us  for an earlier appointment if he notices further swelling, or develops erythema, drainage, or pain.  Lailie Smead G. Harshitha Fretz, PA-C [facility] [phone]

## 2023-06-18 NOTE — Telephone Encounter (Signed)
 Patient's wife, Jerlene Moody, contacted the office stating patient is experiencing pain at his leg incision by his knee. States he was placed on antibiotics last week for early signs of cellulitis. States incisions currently look good, no signs or symptoms of infection. States there is a lump by his knee that is causing him pain. Pain is being managed with oxycodone . States tylenol  and ibuprofen do not help alleviate the pain. Photos sent in and reviewed with Lorrin Rotter, PA. Advised wife lump could be a seroma. Wound check appt scheduled for tomorrow. Wife verbalizes understanding.

## 2023-06-19 ENCOUNTER — Ambulatory Visit: Payer: Self-pay | Attending: Thoracic Surgery (Cardiothoracic Vascular Surgery) | Admitting: Physician Assistant

## 2023-06-19 ENCOUNTER — Other Ambulatory Visit (INDEPENDENT_AMBULATORY_CARE_PROVIDER_SITE_OTHER): Payer: Self-pay | Admitting: Gastroenterology

## 2023-06-19 VITALS — BP 109/72 | HR 82 | Resp 18 | Ht 68.0 in | Wt 125.0 lb

## 2023-06-19 DIAGNOSIS — Z951 Presence of aortocoronary bypass graft: Secondary | ICD-10-CM

## 2023-06-19 MED ORDER — OXYCODONE HCL 5 MG PO TABS: 5.0000 mg | ORAL_TABLET | Freq: Four times a day (QID) | ORAL | 0 refills | Status: DC | PRN

## 2023-06-19 NOTE — Patient Instructions (Signed)
 Keep the incisions clean and dry.  Continue to observe sternal precautions as previously advised.  No change in medications from CT surgery standpoint.  I see no indication to continue antibiotics at this point.  Follow-up in 1 week for wound check.  Please call our office for an earlier appointment if you develop redness, further swelling, or pain at the left knee incision.

## 2023-06-21 NOTE — Progress Notes (Unsigned)
 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  He was recently referred to us  when he presented with unstable angina and was found to have multivessel coronary artery disease.  He underwent three-vessel CABG by Dr. Luna [name] on 05/31/2023 requiring open saphenous vein harvest due to extremely small and tenuous saphenous veins.  His postoperative course was notable for a right pneumothorax with a persistent airleak.  He was discharged home on 06/06/2023 with a pigtail catheter in place to an [facility] collection system.  He presented to the emergency room on 06/08/2023 for evaluation of "leg swelling" but left before being seen.  He contacted Dr. Sherene [name] on 06/09/2023 guarding the lower extremity swelling.  Prescriptions were sent to his pharmacy for 7-day course of Lasix  and potassium which he has completed.  He was seen in our office on 06/12/2023 for scheduled follow-up with a chest x-ray.  The pneumothorax had completely resolved and there was no active air leak so the chest tube was removed at that visit along with all of the staples from the lower extremity vein harvest incisions.  His incisions were all healing well with no evidence of complication at that time.   He contacted our office on 06/18/2023 reporting a raised area at the vein harvest incision near the left knee.  On exam, had the consistency of an organized hematoma without tenderness or inflammation with no immediate indication for drainage or antibiotics. He was asked to return today for another wound check.     Current Outpatient Medications  Medication Sig Dispense Refill   aspirin  EC 81 MG tablet Take 1 tablet (81 mg total) by mouth daily. Swallow whole.     clopidogrel  (PLAVIX ) 75 MG tablet Take 1 tablet (75 mg total) by mouth daily. 30 tablet 1   dicyclomine  (BENTYL ) 10 MG capsule TAKE 1 CAPSULE (10 MG TOTAL) BY MOUTH  EVERY 12 (TWELVE) HOURS AS NEEDED (ABDOMINAL PAIN). 180 capsule 1   diphenoxylate -atropine  (LOMOTIL ) 2.5-0.025 MG tablet TAKE 1 TABLET BY MOUTH 4 (FOUR) TIMES DAILY AS NEEDED FOR DIARRHEA OR LOOSE STOOLS. 180 tablet 1   empagliflozin  (JARDIANCE ) 10 MG TABS tablet Take 1 tablet (10 mg total) by mouth daily. 30 tablet 1   furosemide  (LASIX ) 40 MG tablet Take 1 tablet (40 mg total) by mouth daily. 30 tablet 3   gabapentin  (NEURONTIN ) 300 MG capsule Take 1 capsule (300 mg total) by mouth 2 (two) times daily. 60 capsule 0   losartan  (COZAAR ) 25 MG tablet Take 1 tablet (25 mg total) by mouth daily. 30 tablet 1   MAGNESIUM  PO Take 1 tablet by mouth at bedtime.     metoprolol  succinate (TOPROL -XL) 50 MG 24 hr tablet Take 1 tablet (50 mg total) by mouth at bedtime. Take with or immediately following a meal. 30 tablet 1   Multiple Vitamin (MULTIVITAMIN) tablet Take 1 tablet by mouth at bedtime.     nicotine  (NICODERM CQ  - DOSED IN MG/24 HOURS) 21 mg/24hr patch Place 1 patch (21 mg total) onto the skin daily. 28 patch 0   nicotine  polacrilex (NICORETTE ) 2 MG gum Take 1 each (2 mg total) by mouth as needed for smoking cessation. 100 tablet 0   ondansetron  (ZOFRAN ) 4 MG tablet TAKE 1 TABLET BY MOUTH EVERY 8 HOURS AS NEEDED FOR NAUSEA AND VOMITING 90 tablet 3   oxyCODONE  (OXY IR/ROXICODONE ) 5 MG immediate release tablet Take 1 tablet (5  mg total) by mouth every 6 (six) hours as needed for severe pain (pain score 7-10). 28 tablet 0   potassium chloride  SA (KLOR-CON  M) 20 MEQ tablet Take 1 tablet (20 mEq total) by mouth daily. 30 tablet 1   promethazine  (PHENERGAN ) 12.5 MG tablet TAKE 1 TABLET BY MOUTH EVERY 6 HOURS AS NEEDED FOR NAUSEA OR VOMITING. 30 tablet 0   rosuvastatin  (CRESTOR ) 20 MG tablet Take 1 tablet (20 mg total) by mouth daily. 30 tablet 1   spironolactone  (ALDACTONE ) 25 MG tablet Take 1 tablet (25 mg total) by mouth daily. 30 tablet 1   sucralfate  (CARAFATE ) 1 g tablet Take 1 tablet (1 g total) by  mouth 4 (four) times daily -  with meals and at bedtime. Dissolve tablet in 30ml of water, then drink slurry 120 tablet 1   sulfamethoxazole-trimethoprim (BACTRIM DS) 800-160 MG tablet Take 1 tablet by mouth 2 (two) times daily. 14 tablet 0   No current facility-administered medications for this visit.    Physical Exam  Diagnostic Tests:   Impression:  Plan:   Reginald Providence, PA-C [facility] [phone]

## 2023-06-25 ENCOUNTER — Ambulatory Visit: Payer: Self-pay | Attending: Thoracic Surgery (Cardiothoracic Vascular Surgery)

## 2023-06-26 ENCOUNTER — Ambulatory Visit: Admitting: Nurse Practitioner

## 2023-06-27 ENCOUNTER — Telehealth: Payer: Self-pay

## 2023-06-27 ENCOUNTER — Other Ambulatory Visit (HOSPITAL_COMMUNITY): Payer: Self-pay

## 2023-06-27 NOTE — Telephone Encounter (Signed)
 STD form completed and faxed to [place]  [employer] @[phone]. Beginning LOA 05/28/23 through 08/26/23.Aaron Aas DOS 05/31/23.

## 2023-07-03 ENCOUNTER — Other Ambulatory Visit (HOSPITAL_COMMUNITY): Payer: Self-pay

## 2023-07-04 ENCOUNTER — Ambulatory Visit (INDEPENDENT_AMBULATORY_CARE_PROVIDER_SITE_OTHER): Admitting: Gastroenterology

## 2023-07-05 ENCOUNTER — Ambulatory Visit: Attending: Emergency Medicine | Admitting: Emergency Medicine

## 2023-07-05 ENCOUNTER — Other Ambulatory Visit (HOSPITAL_COMMUNITY)

## 2023-07-05 ENCOUNTER — Encounter: Payer: Self-pay | Admitting: Emergency Medicine

## 2023-07-05 VITALS — BP 102/70 | HR 82 | Ht 68.0 in | Wt 138.0 lb

## 2023-07-05 DIAGNOSIS — I10 Essential (primary) hypertension: Secondary | ICD-10-CM | POA: Diagnosis not present

## 2023-07-05 DIAGNOSIS — I5020 Unspecified systolic (congestive) heart failure: Secondary | ICD-10-CM

## 2023-07-05 DIAGNOSIS — Z951 Presence of aortocoronary bypass graft: Secondary | ICD-10-CM

## 2023-07-05 DIAGNOSIS — I2510 Atherosclerotic heart disease of native coronary artery without angina pectoris: Secondary | ICD-10-CM | POA: Diagnosis not present

## 2023-07-05 DIAGNOSIS — I200 Unstable angina: Secondary | ICD-10-CM

## 2023-07-05 DIAGNOSIS — I255 Ischemic cardiomyopathy: Secondary | ICD-10-CM

## 2023-07-05 DIAGNOSIS — Z720 Tobacco use: Secondary | ICD-10-CM

## 2023-07-05 NOTE — Progress Notes (Unsigned)
 Cardiology Office Note:    Date:  07/06/2023  ID:  Reginald [name], DOB [birth year], MRN [id number] PCP: Cynda Drafts, FNP   [facility] Providers Cardiologist:  Lasalle Pointer, MD       Patient Profile:       Chief Complaint: Follow-up s/p CABG History of Present Illness:  Reginald [name] is a 58 y.o. male with visit-pertinent history of coronary artery disease s/p CABG x 3 on 05/31/2023, tobacco abuse, pulmonary nodule, HFrEF, GERD, migraines, IBS, arthritis  He was admitted 05/28/2023 with unstable angina.  Echocardiogram 05/29/2023 showed LVEF 35 to 40%, grade 1 DD, RWMA, no valvular abnormalities.  Cardiac catheterization 05/29/2023 showed three-vessel obstructive CAD.  He ultimately underwent 3V CABG on 5/9.  He did have a right-sided pneumothorax and discharged from hospital on 515 with pigtail catheter in place.  Repeat chest x-ray showed no pneumothorax and was removed on 06/12/2023.  After discharge from hospital he developed lower extremity edema and went to the ED on 06/08/2023.  He was given IV Lasix  but eloped from the hospital.  He called CT surgery and was prescribed Lasix  40 mg daily x 7 days.  He was seen for follow-up by [facility] on 06/14/2023.  Notes he was feeling much better and denied any acute complaints.  His spironolactone  was increased to 25 mg daily.  He was continued on Lasix  40 mg daily, Toprol -XL 50 mg daily, losartan  25 mg daily, Jardiance  10 mg daily.   Discussed the use of AI scribe software for clinical note transcription with the patient, who gave verbal consent to proceed.  Today patient is doing well overall.  He is without any acute cardiovascular concerns or complaints at this time.  Notes that his chest pains has completely resolved.  His prior anginal equivalent was heartburn/GERD like symptoms.  Notes that his GERD has completely resolved.  Denies any exertional symptoms and denies any angina.  States that he feels much improved.  Reports he is about  40% back to his baseline.  He does have some baseline shortness of breath that has improved slightly.  Noted his shortness breath is likely from his COPD that he was recently diagnosed with.  He tells me ever since he started Lasix  his weight has been stable and he no longer has any lower extremity edema.  He denies any orthopnea or PND.  He is having good urinary output.  He has quit smoking on the day of his CABG.  He is overall happy with his health.  Continues to monitor his weight at home.  Plans to begin taking his blood pressure this week.  He is interested in cardiac rehab.   Review of systems:  Please see the history of present illness. All other systems are reviewed and otherwise negative.      Studies Reviewed:    EKG Interpretation Date/Time:  Friday July 05 2023 09:33:25 EDT Ventricular Rate:  96 PR Interval:  120 QRS Duration:  94 QT Interval:  344 QTC Calculation: 434 R Axis:   97  Text Interpretation: Normal sinus rhythm Rightward axis ST & T wave abnormality, consider inferolateral ischemia When compared with ECG of 08-Jun-2023 07:12, PREVIOUS ECG IS PRESENT Confirmed by Palmer Bobo [phone]) on 07/05/2023 12:52:08 PM    Echocardiogram 05/29/2023 1. Left ventricular ejection fraction, by estimation, is 35 to 40%. The  left ventricle has moderately decreased function. The left ventricle  demonstrates regional wall motion abnormalities (see scoring  diagram/findings for description). Left  ventricular   diastolic parameters are consistent with Grade I diastolic dysfunction  (impaired relaxation).   2. Right ventricular systolic function is normal. The right ventricular  size is normal. Tricuspid regurgitation signal is inadequate for assessing  PA pressure.   3. The mitral valve is normal in structure. No evidence of mitral valve  regurgitation. No evidence of mitral stenosis.   4. The aortic valve is normal in structure. Aortic valve regurgitation is  not visualized.  No aortic stenosis is present.   5. The inferior vena cava is normal in size with greater than 50%  respiratory variability, suggesting right atrial pressure of 3 mmHg.   Cardiac catheterization 05/29/2023   Ost LAD to Prox LAD lesion is 70% stenosed.   Mid LAD lesion is 80% stenosed.   1st Mrg lesion is 95% stenosed.   Prox Cx to Mid Cx lesion is 60% stenosed.   Prox RCA to Mid RCA lesion is 90% stenosed.   LV end diastolic pressure is mildly elevated.   3 vessel obstructive CAD Mildly elevated LVEDP Diagnostic Dominance: Right  Risk Assessment/Calculations:              Physical Exam:   VS:  BP 102/70   Pulse 82   Ht 5' 8 (1.727 m)   Wt 138 lb (62.6 kg)   SpO2 97%   BMI 20.98 kg/m    Wt Readings from Last 3 Encounters:  07/05/23 138 lb (62.6 kg)  06/19/23 125 lb (56.7 kg)  06/14/23 126 lb 6.4 oz (57.3 kg)    GEN: Well nourished, well developed in no acute distress NECK: No JVD; No carotid bruits CARDIAC: RRR, no murmurs, rubs, gallops RESPIRATORY:  Clear to auscultation without rales, wheezing or rhonchi  ABDOMEN: Soft, non-tender, non-distended EXTREMITIES:  No edema; No acute deformity      Assessment and Plan:  Coronary artery disease s/p CABG [facility] 05/2023 with three-vessel obstructive CAD and underwent 3V CABG on 05/31/2023 His postoperative course was notable for a right pneumothorax with a persistent airleak. Discharged with pigtail catheter that was removed 5/28 - Today he is without any anginal symptoms, no indication for further ischemic evaluation at this time.  Slowly maintaining more active lifestyle without exertional symptoms.  Denies prior anginal equivalent and feels vastly improved. - Continue aspirin  81 mg daily, clopidogrel  75 mg daily, metoprolol  XL 50 mg daily, rosuvastatin  20 mg daily - Follows up with cardiothoracic surgery on 07/09/2023 - Cleared to begin cardiac rehab pending cardiothoracic surgery  HFrEF Ischemic  cardiomyopathy Echocardiogram 05/2023 with LVEF 35-40% - Today patient is euvolemic and well compensated on exam.  NYHA class II symptoms.  Mild fatigue and dyspnea (COPD Hx).  Denies weight gain, orthopnea, PND.  Leg swelling has resolved with Lasix . - Plan for repeat echocardiogram 3-4 months to reevaluate LV function - Unable to further optimize GDMT given low normal BP - Continue GDMT Jardiance  10 mg daily, furosemide  40 mg daily, losartan  25 mg daily, Toprol -XL 50 mg daily, spironolactone  25 mg daily - BMET today  Hyperlipidemia, goal LDL <55 LDL 162 on 05/2023 not well-controlled however recently started on rosuvastatin  - Plan for repeat lipid panel and LFTs x 3 months - Continue rosuvastatin  20 mg daily - Encouraged heart healthy dieting  Tobacco abuse Severe emphysema No recent exacerbation Pt w/ >60pack years of tobacco use No longer smoking cigarettes, quit 05/28/2023 - Congratulated on tobacco cessation  Hypertension Blood pressure today 102/70 and well-controlled - Continue HF GDMT - Maintain  home BP log    Cardiac Rehabilitation Eligibility Assessment  The patient is ready to start cardiac rehabilitation pending clearance from the cardiac surgeon.     Dispo:  Return in about 4 months (around 11/04/2023).  Signed, Ava Boatman, NP

## 2023-07-05 NOTE — Patient Instructions (Addendum)
 Medication Instructions:  NO CHANGES   Lab Work: BMET TO BE DONE TODAY. FASTING LIPID PANEL AND LFTs TO BE DONE IN 3 MONTHS.   Testing/Procedures: Your physician has requested that you have an ECHOCARDIOGRAM. Echocardiography is a painless test that uses sound waves to create images of your heart. It provides your doctor with information about the size and shape of your heart and how well your heart's chambers and valves are working. This procedure takes approximately one hour. There are no restrictions for this procedure. Please do NOT wear cologne, perfume, aftershave, or lotions (deodorant is allowed). Please arrive 15 minutes prior to your appointment time.  Please note: We ask at that you not bring children with you during ultrasound (echo/ vascular) testing. Due to room size and safety concerns, children are not allowed in the ultrasound rooms during exams. Our front office staff cannot provide observation of children in our lobby area while testing is being conducted. An adult accompanying a patient to their appointment will only be allowed in the ultrasound room at the discretion of the ultrasound technician under special circumstances. We apologize for any inconvenience.   Follow-Up: At [facility], you and your health needs are our priority.  As part of our continuing mission to provide you with exceptional heart care, our providers are all part of one team.  This team includes your primary Cardiologist (physician) and Advanced Practice Providers or APPs (Physician Assistants and Nurse Practitioners) who all work together to provide you with the care you need, when you need it.  Your next appointment:   4 MONTHS  Provider:   MADISON FOUNTAIN, DNP

## 2023-07-06 LAB — BASIC METABOLIC PANEL WITH GFR
BUN/Creatinine Ratio: 13 (ref 9–20)
BUN: 10 mg/dL (ref 6–24)
CO2: 23 mmol/L (ref 20–29)
Calcium: 9.3 mg/dL (ref 8.7–10.2)
Chloride: 101 mmol/L (ref 96–106)
Creatinine, Ser: 0.79 mg/dL (ref 0.76–1.27)
Glucose: 98 mg/dL (ref 70–99)
Potassium: 5.2 mmol/L (ref 3.5–5.2)
Sodium: 140 mmol/L (ref 134–144)
eGFR: 104 mL/min/{1.73_m2} (ref >59)

## 2023-07-07 ENCOUNTER — Ambulatory Visit: Payer: Self-pay | Admitting: Emergency Medicine

## 2023-07-08 ENCOUNTER — Other Ambulatory Visit: Payer: Self-pay | Admitting: Thoracic Surgery (Cardiothoracic Vascular Surgery)

## 2023-07-08 DIAGNOSIS — Z951 Presence of aortocoronary bypass graft: Secondary | ICD-10-CM

## 2023-07-08 NOTE — Progress Notes (Signed)
 [address]               Thurmon BROCKS [place], [place] [address]                      [phone]   Cardiologist: Dr. Stacia  HPI: This patient is s/p CABG x 3 (left internal mammary artery to LAD,  saphenous vein graft to obtuse marginal, saphenous vein graft to posterior descending) with endoscopic vein harvest right leg, open vein harvest from both thighs by Dr. Kerrin on 05/31/2023. He was last seen by my colleague on 06/19/2023 for a possible left hematoma 06/12/2023. He had already completed an antibiotic (Bactrim  DS) for early cellulitis and was not felt to require any further antibiotic. Also, he was found on CT to have an irregular nodule of the right pulmonary apex measuring 1.0 x 0.5 cm. This is modestly suspicious for primary lung malignancy. He presents today for follow up for hematoma on left. He has complaints of difficulty sleeping, right forearm intermittent pain/weakness (will spontaneously drop things), urinating with at times multiple streams, intermittent pain inner thighs after walking. He has continued to not smoke!   Current Outpatient Medications  Medication Sig Dispense Refill   aspirin  EC 81 MG tablet Take 1 tablet (81 mg total) by mouth daily. Swallow whole.     clopidogrel  (PLAVIX ) 75 MG tablet Take 1 tablet (75 mg total) by mouth daily. 30 tablet 1   dicyclomine  (BENTYL ) 10 MG capsule TAKE 1 CAPSULE (10 MG TOTAL) BY MOUTH EVERY 12 (TWELVE) HOURS AS NEEDED (ABDOMINAL PAIN). 180 capsule 1   diphenoxylate -atropine  (LOMOTIL ) 2.5-0.025 MG tablet TAKE 1 TABLET BY MOUTH 4 (FOUR) TIMES DAILY AS NEEDED FOR DIARRHEA OR LOOSE STOOLS. 180 tablet 1   empagliflozin  (JARDIANCE ) 10 MG TABS tablet Take 1 tablet (10 mg total) by mouth daily. 30 tablet 1   furosemide  (LASIX ) 40 MG tablet Take 1 tablet (40 mg total) by mouth daily. 30 tablet 3   gabapentin  (NEURONTIN ) 300 MG capsule Take 1 capsule (300 mg total) by mouth 2 (two) times daily. 60 capsule 0    losartan  (COZAAR ) 25 MG tablet Take 1 tablet (25 mg total) by mouth daily. 30 tablet 1   MAGNESIUM  PO Take 1 tablet by mouth at bedtime.     metoprolol  succinate (TOPROL -XL) 50 MG 24 hr tablet Take 1 tablet (50 mg total) by mouth at bedtime. Take with or immediately following a meal. 30 tablet 1   Multiple Vitamin (MULTIVITAMIN) tablet Take 1 tablet by mouth at bedtime.     nicotine  (NICODERM CQ  - DOSED IN MG/24 HOURS) 21 mg/24hr patch Place 1 patch (21 mg total) onto the skin daily. 28 patch 0   nicotine  polacrilex (NICORETTE ) 2 MG gum Take 1 each (2 mg total) by mouth as needed for smoking cessation. 100 tablet 0   ondansetron  (ZOFRAN ) 4 MG tablet TAKE 1 TABLET BY MOUTH EVERY 8 HOURS AS NEEDED FOR NAUSEA AND VOMITING 90 tablet 3   oxyCODONE  (OXY IR/ROXICODONE ) 5 MG immediate release tablet Take 1 tablet (5 mg total) by mouth every 6 (six) hours as needed for severe pain (pain score 7-10). 28 tablet 0   potassium chloride  SA (KLOR-CON  M) 20 MEQ tablet Take 1 tablet (20 mEq total) by mouth daily. 30 tablet 1   promethazine  (PHENERGAN ) 12.5 MG tablet TAKE 1 TABLET BY MOUTH EVERY 6 HOURS AS NEEDED  FOR NAUSEA OR VOMITING. 30 tablet 0   rosuvastatin  (CRESTOR ) 20 MG tablet Take 1 tablet (20 mg total) by mouth daily. 30 tablet 1   spironolactone  (ALDACTONE ) 25 MG tablet Take 1 tablet (25 mg total) by mouth daily. 30 tablet 1   sucralfate  (CARAFATE ) 1 g tablet Take 1 tablet (1 g total) by mouth 4 (four) times daily -  with meals and at bedtime. Dissolve tablet in 30ml of water, then drink slurry 120 tablet 1   sulfamethoxazole -trimethoprim  (BACTRIM  DS) 800-160 MG tablet Take 1 tablet by mouth 2 (two) times daily. 14 tablet 0  Vital Signs: Vitals:   07/09/23 0951  BP: 123/84  Pulse: 94  Resp: 18  SpO2: 93%      Physical Exam: CV-RRR Pulmonary-Clear to auscultation bilaterally Extremities-No LE edema. Possible hematoma left leg resolved Wounds-Clean, dry, well healed, no sign of  infection  Diagnostic Tests: Narrative & Impression  CLINICAL DATA:  Status post CABG   EXAM: CHEST - 2 VIEW   COMPARISON:  X-ray 06/12/2023   FINDINGS: Hyperinflation. No consolidation, pneumothorax or effusion. No edema. Chronic lung changes identified. Normal cardiopericardial silhouette. Sternal wires. Degenerative changes of the spine on lateral view.   IMPRESSION: Postop chest.  Chronic lung changes.  Hyperinflation.     Electronically Signed   By: Ranell Bring M.D.   On: 07/09/2023 10:23   Impression and Plan: We reviewed today's chest x ray. There were no changes made to his medications. He was seen by the cardiology NP on 07/05/2023. No changes were made to his medications. We reviewed driving, participation in cardiac rehab, and sternal precautions (no lifting more than 10 pounds until after 07/31/2023.) He will continued to be followed by cardiology indefinitely. He will need a CT and follow up for the irregular nodule of the right pulmonary apex measuring 1.0 x 0.5 cm. PET CT and follow up will be arranged with Dr. Kerrin. Regarding right forearm pain/weakness, referral to PT.  Kyla CHRISTELLA Donald, PA-C [facility] [phone]

## 2023-07-09 ENCOUNTER — Other Ambulatory Visit: Payer: Self-pay

## 2023-07-09 ENCOUNTER — Ambulatory Visit: Payer: Self-pay | Attending: Thoracic Surgery (Cardiothoracic Vascular Surgery) | Admitting: Physician Assistant

## 2023-07-09 ENCOUNTER — Other Ambulatory Visit: Payer: Self-pay | Admitting: Thoracic Surgery (Cardiothoracic Vascular Surgery)

## 2023-07-09 ENCOUNTER — Ambulatory Visit (HOSPITAL_COMMUNITY)
Admission: RE | Admit: 2023-07-09 | Discharge: 2023-07-09 | Disposition: A | Discharge status: Home / Self Care | Source: Ambulatory Visit | Attending: Cardiovascular Disease | Admitting: Cardiovascular Disease

## 2023-07-09 ENCOUNTER — Ambulatory Visit: Payer: Self-pay | Admitting: Thoracic Surgery (Cardiothoracic Vascular Surgery)

## 2023-07-09 VITALS — BP 123/84 | HR 94 | Resp 18 | Ht 68.0 in | Wt 140.0 lb

## 2023-07-09 DIAGNOSIS — Z951 Presence of aortocoronary bypass graft: Secondary | ICD-10-CM

## 2023-07-09 DIAGNOSIS — R918 Other nonspecific abnormal finding of lung field: Secondary | ICD-10-CM | POA: Insufficient documentation

## 2023-07-09 DIAGNOSIS — R911 Solitary pulmonary nodule: Secondary | ICD-10-CM

## 2023-07-11 ENCOUNTER — Other Ambulatory Visit (HOSPITAL_COMMUNITY): Payer: Self-pay | Admitting: Adult Health

## 2023-07-18 ENCOUNTER — Ambulatory Visit (HOSPITAL_COMMUNITY)

## 2023-07-19 ENCOUNTER — Ambulatory Visit (HOSPITAL_COMMUNITY): Admission: RE | Admit: 2023-07-19 | Source: Ambulatory Visit

## 2023-07-23 ENCOUNTER — Other Ambulatory Visit: Payer: Self-pay | Admitting: Physician Assistant

## 2023-07-23 MED ORDER — OXYCODONE HCL 5 MG PO TABS: 5.0000 mg | ORAL_TABLET | Freq: Four times a day (QID) | ORAL | 0 refills | Status: AC | PRN

## 2023-07-23 NOTE — Progress Notes (Signed)
 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  I explained to patient this can not be a long term solution for this.  I will plan to refill his medication as he is scheduled in our office for follow up on 7/15.  However if there is no improvement in symptoms he will need to go to vascular surgery for additional follow up.  Adina Puzzo, PA-C 12:16 PM 07/23/23   RX for oxycodone  5 mg 1 tablet every 6 hours as needed for pain for the next 14 days.. # 56 w/o refill  Reginald Spinnato, PA-C

## 2023-07-24 ENCOUNTER — Ambulatory Visit: Admitting: Cardiology

## 2023-08-01 ENCOUNTER — Other Ambulatory Visit: Payer: Self-pay | Admitting: *Deleted

## 2023-08-01 MED ORDER — POTASSIUM CHLORIDE CRYS ER 20 MEQ PO TBCR: 20.0000 meq | EXTENDED_RELEASE_TABLET | Freq: Every day | ORAL | 1 refills | Status: AC

## 2023-08-06 ENCOUNTER — Ambulatory Visit: Admitting: Thoracic Surgery (Cardiothoracic Vascular Surgery)

## 2023-08-08 ENCOUNTER — Other Ambulatory Visit: Payer: Self-pay

## 2023-08-08 ENCOUNTER — Ambulatory Visit: Attending: Thoracic Surgery (Cardiothoracic Vascular Surgery)

## 2023-08-08 DIAGNOSIS — M6281 Muscle weakness (generalized): Secondary | ICD-10-CM | POA: Insufficient documentation

## 2023-08-08 DIAGNOSIS — M79601 Pain in right arm: Secondary | ICD-10-CM | POA: Insufficient documentation

## 2023-08-08 NOTE — Therapy (Signed)
 OUTPATIENT PHYSICAL THERAPY NEURO EVALUATION   Patient Name: Reginald [name] MRN: [id number] DOB:09-18-1965, 58 y.o., male Today's Date: 08/08/2023   PCP: Anita Bernardino BROCKS, FNP REFERRING PROVIDER: Kerrin Elspeth BROCKS, MD  END OF SESSION:  PT End of Session - 08/08/23 0831     Visit Number 1    Number of Visits 5    Date for PT Re-Evaluation 09/12/23    Authorization Type [employer]    PT Start Time [phone]    PT Stop Time 0930    PT Time Calculation (min) 55 min          Past Medical History:  Diagnosis Date   Arthritis    GERD (gastroesophageal reflux disease)    Headache    migraines    IBS (irritable bowel syndrome)    Past Surgical History:  Procedure Laterality Date   BIOPSY  11/17/2021   Procedure: BIOPSY;  Surgeon: Eartha Angelia Sieving, MD;  Location: [facility];  Service: Gastroenterology;;   COLONOSCOPY WITH PROPOFOL  N/A 11/17/2021   Procedure: COLONOSCOPY WITH PROPOFOL ;  Surgeon: Eartha Angelia Sieving, MD;  Location: [facility];  Service: Gastroenterology;  Laterality: N/A;  200 ASA 2   CORONARY ARTERY BYPASS GRAFT N/A 05/31/2023   Procedure: CORONARY ARTERY BYPASS GRAFTING (CABG) TIMES THREE USING LEFT INTERNAL MAMMARY ARTERY AND OPENLY HARVESTED RIGHT AND LEFT GREATER SAPHENOUS VEIN;  Surgeon: Kerrin Elspeth BROCKS, MD;  Location: [facility] OR;  Service: Open Heart Surgery;  Laterality: N/A;   ESOPHAGOGASTRODUODENOSCOPY N/A 04/24/2023   Procedure: EGD (ESOPHAGOGASTRODUODENOSCOPY);  Surgeon: Eartha Angelia, Sieving, MD;  Location: [facility];  Service: Gastroenterology;  Laterality: N/A;  9:15AM;ASA 2   ESOPHAGOGASTRODUODENOSCOPY (EGD) WITH PROPOFOL  N/A 11/17/2021   Procedure: ESOPHAGOGASTRODUODENOSCOPY (EGD) WITH PROPOFOL ;  Surgeon: Eartha Angelia Sieving, MD;  Location: [facility];  Service: Gastroenterology;  Laterality: N/A;   INGUINAL HERNIA REPAIR Right 06/02/2015   Procedure: RIGHT INGUINAL HERNIA REPAIR WITH MESH;  Surgeon: Camellia Blush, MD;   Location: [facility] OR;  Service: General;  Laterality: Right;   INSERTION OF MESH Right 06/02/2015   Procedure: INSERTION OF MESH;  Surgeon: Camellia Blush, MD;  Location: [facility] OR;  Service: General;  Laterality: Right;   INTRAOPERATIVE TRANSESOPHAGEAL ECHOCARDIOGRAM N/A 05/31/2023   Procedure: ECHOCARDIOGRAM, TRANSESOPHAGEAL, INTRAOPERATIVE;  Surgeon: Kerrin Elspeth BROCKS, MD;  Location[facility] OR;  Service: Open Heart Surgery;  Laterality: N/A;   LEFT HEART CATH AND CORONARY ANGIOGRAPHY N/A 05/29/2023   Procedure: LEFT HEART CATH AND CORONARY ANGIOGRAPHY;  Surgeon: [place], Peter M, MD;  Location: [facility] INVASIVE CV LAB;  Service: Cardiovascular;  Laterality: N/A;   NO PAST SURGERIES     POLYPECTOMY  11/17/2021   Procedure: POLYPECTOMY;  Surgeon: Eartha Angelia Sieving, MD;  Location: [facility];  Service: Gastroenterology;;   Patient Active Problem List   Diagnosis Date Noted   S/P CABG (coronary artery bypass graft) 05/31/2023   Unstable angina (HCC) 05/28/2023   Pulmonary nodule 05/28/2023   Essential hypertension 05/28/2023   IBS (irritable bowel syndrome) 05/28/2023   Melena 04/01/2023   Abdominal pain, epigastric 04/01/2023   Gastroesophageal reflux disease 01/01/2023   Helicobacter pylori gastritis 12/07/2021   Nausea and vomiting 09/04/2021   Chronic diarrhea 09/04/2021    ONSET DATE: May 2025  REFERRING DIAG:  R29.898 (ICD-10-CM) - Weakness of right arm    THERAPY DIAG:  Muscle weakness (generalized)  Pain in right arm  Rationale for Evaluation and Treatment: Rehabilitation  SUBJECTIVE:  SUBJECTIVE STATEMENT: Underwent CABG x 3 in May 2025. Prior to surgery they were attempting cath through the right radial artery and suffering a hematoma to forearm.  Since that time he is experiencing ongoing  right forearm pain and weakness and notes extending to the thumb, index, middle finger. Is right hand dominant. Making a fist is painful to the forearm. Notes overall, there has been improvement but limited dexterity of right hand at this time.  Works for [employer] and this is a very physical job.  Pt accompanied by: significant other  PERTINENT HISTORY: seen by the cardiology NP on 07/05/2023. No changes were made to his medications. We reviewed driving, participation in cardiac rehab, and sternal precautions (no lifting more than 10 pounds until after 07/31/2023.) He will continued to be followed by cardiology indefinitely. He will need a CT and follow up for the irregular nodule of the right pulmonary apex measuring 1.0 x 0.5 cm. PET CT and follow up will be arranged with Dr. Kerrin. Regarding right forearm pain/weakness, referral to PT.  PAIN:  Are you having pain? Yes: NPRS scale: 7/10 Pain location: anterior right forearm Pain description: throb Aggravating factors: using RUE Relieving factors: shaking, elevating overhead  PRECAUTIONS: Sternal  RED FLAGS: None   WEIGHT BEARING RESTRICTIONS: No  FALLS: Has patient fallen in last 6 months? No  LIVING ENVIRONMENT: Lives with: lives with their family Lives in: House/apartment Stairs:   Has following equipment at home: None  PLOF: Independent  PATIENT GOALS:   OBJECTIVE:  Note: Objective measures were completed at Evaluation unless otherwise noted.  DIAGNOSTIC FINDINGS:   COGNITION: Overall cognitive status: Within functional limits for tasks assessed   SENSATION: Numbness/tingling right anterior forearm radial are, extending to thumb and 2-3 fingers  Grip strength: left grip 80#; right grip: 33# average (10/10 pain with grip) Pinch strength: left pinch 20#, right pinch 10# average     DTRs:  Biceps 2+ = Normal and Brachioradialis 0 = Absent  POSTURE: No Significant postural  limitations  Right wrist ROM/strength:  wrist flexion 30 deg, wrist ext 20 deg Right Wrist extension strength 3/5  Right wrist flexion: 4/5 Finger abduction: 3-/5 Thumb adduction: DIP flexion with paper pull Appreciate atrophy to dorsal interossei  Pronation 3/5                                                                                                                                 TREATMENT DATE: 08/08/23    PATIENT EDUCATION: Education details: assessment findings, HEP initiation Person educated: Patient and Spouse Education method: Explanation Education comprehension: verbalized understanding  HOME EXERCISE PROGRAM: Access Code: [email or web address] URL: [email or web address] Date: 08/08/2023 Prepared by: Burnard Sandifer  Exercises - Wrist Flexor Stretch on Table  - 1 x daily - 7 x weekly - 3 sets - 10 reps - Forearm Pronation and Supination with Hammer  - 1 x daily - 7 x weekly - 3  sets - 10 reps - Finger Lumbricals with Putty  - 1 x daily - 7 x weekly - 3 sets - 10 reps - Key Pinch with Putty  - 1 x daily - 7 x weekly - 3 sets - 10 reps  GOALS: Goals reviewed with patient? Yes  SHORT TERM GOALS: Target date: same as LTG    LONG TERM GOALS: Target date: 08/08/23  Patient will be independent in HEP to improve functional outcomes Baseline:  Goal status: INITIAL  2.  Improve grip strength to 45# right hand to improve grasp and ability to hold cups Baseline: 33# Goal status: INITIAL  3.  Increase wrist extension strength 4/5 for improved grip Baseline: 3/5 Goal status: INITIAL  4.  Pain in right forearm 2/10 with gripping tasks to improve activity tolerance Baseline: 7/10 Goal status: INITIAL    ASSESSMENT:  CLINICAL IMPRESSION: Patient is a 58 y.o. male who was seen today for physical therapy evaluation and treatment for right hand weakness following recent cardiac stent access.  Experiencing right ventral forearm pain and limited ROM, strength,  and dexterity affecting dominant right hand.  Experience global weakness and observe atrohpy to hand intrinsics.  At present, unable to perform work related tasks of lifting, gripping, and use of tools.  PT services indicated to address deficits and limitations to improve functional use of RUE in preparation for return to work   OBJECTIVE IMPAIRMENTS: decreased ROM, decreased strength, impaired flexibility, impaired UE functional use, and pain.   ACTIVITY LIMITATIONS: carrying, lifting, dressing, and reach over head  PARTICIPATION LIMITATIONS: meal prep, cleaning, community activity, occupation, and yard work  PERSONAL FACTORS: Age and Time since onset of injury/illness/exacerbation are also affecting patient's functional outcome.   REHAB POTENTIAL: Good  CLINICAL DECISION MAKING: Evolving/moderate complexity  EVALUATION COMPLEXITY: Moderate  PLAN:  PT FREQUENCY: 1x/week  PT DURATION: 4 weeks  PLANNED INTERVENTIONS: 97750- Physical Performance Testing, 97110-Therapeutic exercises, 97530- Therapeutic activity, [id number]- Neuromuscular re-education, 97535- Self Care, [id number]- Manual therapy, [id number]- Gait training, [id number]- Orthotic Initial, [id number]- Splinting, [id number]- Electrical stimulation (manual), and 20560 (1-2 muscles), 20561 (3+ muscles)- Dry Needling  PLAN FOR NEXT SESSION: review HEP, soft tissue mobilization?   12:57 PM, 08/08/23 M. Kelly Amala Petion, PT, DPT Physical Therapist- [place] Office Number: [phone]

## 2023-08-09 ENCOUNTER — Other Ambulatory Visit: Payer: Self-pay

## 2023-08-09 MED ORDER — ROSUVASTATIN CALCIUM 20 MG PO TABS: 20.0000 mg | ORAL_TABLET | Freq: Every day | ORAL | 3 refills | Status: DC

## 2023-08-13 ENCOUNTER — Other Ambulatory Visit (HOSPITAL_COMMUNITY)

## 2023-08-14 NOTE — Therapy (Signed)
 OUTPATIENT PHYSICAL THERAPY NEURO TREATMENT   Patient Name: Reginald [name] MRN: [id number] DOB:[birth year], 58 y.o., male Today's Date: 08/15/2023   PCP: Anita Bernardino BROCKS, FNP REFERRING PROVIDER: Kerrin Elspeth BROCKS, MD  END OF SESSION:  PT End of Session - 08/15/23 0929     Visit Number 2    Number of Visits 5    Date for PT Re-Evaluation 09/12/23    Authorization Type [employer]    PT Start Time [phone]    PT Stop Time 0928    PT Time Calculation (min) 42 min    Activity Tolerance Patient tolerated treatment well    Behavior During Therapy [facility] for tasks assessed/performed           Past Medical History:  Diagnosis Date   Arthritis    GERD (gastroesophageal reflux disease)    Headache    migraines    IBS (irritable bowel syndrome)    Past Surgical History:  Procedure Laterality Date   BIOPSY  11/17/2021   Procedure: BIOPSY;  Surgeon: Eartha Angelia Sieving, MD;  [facility]: [facility];  Service: Gastroenterology;;   COLONOSCOPY WITH PROPOFOL  N/A 11/17/2021   Procedure: COLONOSCOPY WITH PROPOFOL ;  Surgeon: Eartha Angelia Sieving, MD;  Location: [facility];  Service: Gastroenterology;  Laterality: N/A;  200 ASA 2   CORONARY ARTERY BYPASS GRAFT N/A 05/31/2023   Procedure: CORONARY ARTERY BYPASS GRAFTING (CABG) TIMES THREE USING LEFT INTERNAL MAMMARY ARTERY AND OPENLY HARVESTED RIGHT AND LEFT GREATER SAPHENOUS VEIN;  Surgeon: Kerrin Elspeth BROCKS, MD;  Location: [facility] OR;  Service: Open Heart Surgery;  Laterality: N/A;   ESOPHAGOGASTRODUODENOSCOPY N/A 04/24/2023   Procedure: EGD (ESOPHAGOGASTRODUODENOSCOPY);  Surgeon: Eartha Angelia, Sieving, MD;  Location: [facility];  Service: Gastroenterology;  Laterality: N/A;  9:15AM;ASA 2   ESOPHAGOGASTRODUODENOSCOPY (EGD) WITH PROPOFOL  N/A 11/17/2021   Procedure: ESOPHAGOGASTRODUODENOSCOPY (EGD) WITH PROPOFOL ;  Surgeon: Eartha Angelia Sieving, MD;  [facility];  Service: Gastroenterology;  Laterality: N/A;   INGUINAL  HERNIA REPAIR Right 06/02/2015   Procedure: RIGHT INGUINAL HERNIA REPAIR WITH MESH;  Surgeon: Camellia Blush, MD;  Location: [facility] OR;  Service: General;  Laterality: Right;   INSERTION OF MESH Right 06/02/2015   Procedure: INSERTION OF MESH;  Surgeon: Camellia Blush, MD;  Location: [facility] OR;  Service: General;  Laterality: Right;   INTRAOPERATIVE TRANSESOPHAGEAL ECHOCARDIOGRAM N/A 05/31/2023   Procedure: ECHOCARDIOGRAM, TRANSESOPHAGEAL, INTRAOPERATIVE;  Surgeon: Kerrin Elspeth BROCKS, MD;  Location: [facility] OR;  Service: Open Heart Surgery;  Laterality: N/A;   LEFT HEART CATH AND CORONARY ANGIOGRAPHY N/A 05/29/2023   Procedure: LEFT HEART CATH AND CORONARY ANGIOGRAPHY;  Surgeon: [place], Peter M, MD;  Location: [facility] INVASIVE CV LAB;  Service: Cardiovascular;  Laterality: N/A;   NO PAST SURGERIES     POLYPECTOMY  11/17/2021   Procedure: POLYPECTOMY;  Surgeon: Eartha Angelia Sieving, MD;  Location: [facility];  Service: Gastroenterology;;   Patient Active Problem List   Diagnosis Date Noted   S/P CABG (coronary artery bypass graft) 05/31/2023   Unstable angina (HCC) 05/28/2023   Pulmonary nodule 05/28/2023   Essential hypertension 05/28/2023   IBS (irritable bowel syndrome) 05/28/2023   Melena 04/01/2023   Abdominal pain, epigastric 04/01/2023   Gastroesophageal reflux disease 01/01/2023   Helicobacter pylori gastritis 12/07/2021   Nausea and vomiting 09/04/2021   Chronic diarrhea 09/04/2021    ONSET DATE: May 2025  REFERRING DIAG:  R29.898 (ICD-10-CM) - Weakness of right arm    THERAPY DIAG:  Muscle weakness (generalized)  Pain in  right arm  Rationale for Evaluation and Treatment: Rehabilitation  SUBJECTIVE:                                                                                                                                                                                             SUBJECTIVE STATEMENT: Still have cuts on the inside of this legs from surgery- still healing. R arm is  a little better; worse at night and after I use it. Sometimes better when raising it overhead. Denies neck problems. Denies concerns on HEP    Pt accompanied by: significant other, kids  PERTINENT HISTORY: seen by the cardiology NP on 07/05/2023. No changes were made to his medications. We reviewed driving, participation in cardiac rehab, and sternal precautions (no lifting more than 10 pounds until after 07/31/2023.) He will continued to be followed by cardiology indefinitely. He will need a CT and follow up for the irregular nodule of the right pulmonary apex measuring 1.0 x 0.5 cm. PET CT and follow up will be arranged with Dr. Kerrin. Regarding right forearm pain/weakness, referral to PT.  PAIN:  Are you having pain? Yes: NPRS scale: 5-6/10 Pain location: anterior right forearm Pain description: throb Aggravating factors: using RUE Relieving factors: shaking, elevating overhead  PRECAUTIONS: Sternal  RED FLAGS: None   WEIGHT BEARING RESTRICTIONS: No  FALLS: Has patient fallen in last 6 months? No  LIVING ENVIRONMENT: Lives with: lives with their family Lives in: House/apartment Stairs:   Has following equipment at home: None  PLOF: Independent  PATIENT GOALS:   OBJECTIVE:     TODAY'S TREATMENT: 08/14/23 Activity Comments  review of HEP: wrist flexor stretch on table 5x10 pronation/supination with weight 4# finger lumbricals w/ putty key pinch w/ putty With wrist flexro stretch- pt's R dorsal hand became pale as if reduced circiulation however denies pain. Adjusted reps to 5x5 instead.   Pt restricted in pronation ROM and limited control of 4#. Cues to move within tolerable ROM  Verbal review of putty exercises   R median nerve glide 2x5  Cueing for form. Restricted elbow and wrist   R radial nerve glide 2x5  Cueing for form. Some restriction in wrist flexion   STM and manual TPR R forearm Very tight and tender in wrist flexors/extensors and supinator.  Improved restriction and pain after MT           HOME EXERCISE PROGRAM Last updated: 08/15/23 Access Code: [email or web address] URL: [email or web address] Date: 08/15/2023 Prepared by: [facility] - Outpatient  Rehab - [facility]  Exercises - Wrist Flexor Stretch on Table  - 1 x daily - 7 x weekly -  3 sets - 5 reps - 5 sec hold - Forearm Pronation and Supination with Hammer  - 1 x daily - 7 x weekly - 3 sets - 10 reps - Finger Lumbricals with Putty  - 1 x daily - 7 x weekly - 3 sets - 10 reps - Key Pinch with Putty  - 1 x daily - 7 x weekly - 3 sets - 10 reps - Radial Nerve Flossing  - 1 x daily - 5 x weekly - 2 sets - 10 reps - Median Nerve Flossing - Tray  - 1 x daily - 5 x weekly - 2 sets - 10 reps   PATIENT EDUCATION: Education details: adjustment to HEP and encouraged pt to monitor R hand pallor  Person educated: Patient and Spouse Education method: Explanation, Demonstration, Tactile cues, Verbal cues, and Handouts Education comprehension: verbalized understanding and returned demonstration     Note: Objective measures were completed at Evaluation unless otherwise noted.  DIAGNOSTIC FINDINGS:   COGNITION: Overall cognitive status: Within functional limits for tasks assessed   SENSATION: Numbness/tingling right anterior forearm radial are, extending to thumb and 2-3 fingers  Grip strength: left grip 80#; right grip: 33# average (10/10 pain with grip) Pinch strength: left pinch 20#, right pinch 10# average     DTRs:  Biceps 2+ = Normal and Brachioradialis 0 = Absent  POSTURE: No Significant postural limitations  Right wrist ROM/strength:  wrist flexion 30 deg, wrist ext 20 deg Right Wrist extension strength 3/5  Right wrist flexion: 4/5 Finger abduction: 3-/5 Thumb adduction: DIP flexion with paper pull Appreciate atrophy to dorsal interossei  Pronation 3/5                                                                                                                                TREATMENT DATE: 08/08/23    PATIENT EDUCATION: Education details: assessment findings, HEP initiation Person educated: Patient and Spouse Education method: Explanation Education comprehension: verbalized understanding  HOME EXERCISE PROGRAM: Access Code: [email or web address] URL: [email or web address] Date: 08/08/2023 Prepared by: Kelly Halpin  Exercises - Wrist Flexor Stretch on Table  - 1 x daily - 7 x weekly - 3 sets - 10 reps - Forearm Pronation and Supination with Hammer  - 1 x daily - 7 x weekly - 3 sets - 10 reps - Finger Lumbricals with Putty  - 1 x daily - 7 x weekly - 3 sets - 10 reps - Key Pinch with Putty  - 1 x daily - 7 x weekly - 3 sets - 10 reps  GOALS: Goals reviewed with patient? Yes  SHORT TERM GOALS: Target date: same as LTG    LONG TERM GOALS: Target date: 08/08/23  Patient will be independent in HEP to improve functional outcomes Baseline:  Goal status: IN PROGRESS  2.  Improve grip strength to 45# right hand to improve grasp and ability to hold cups Baseline: 33# Goal status: IN PROGRESS  3.  Increase wrist extension strength 4/5 for improved grip Baseline: 3/5 Goal status: IN PROGRESS  4.  Pain in right forearm 2/10 with gripping tasks to improve activity tolerance Baseline: 7/10 Goal status: IN PROGRESS    ASSESSMENT:  CLINICAL IMPRESSION: Patient arrived to session with report of some improvement in R UE pain. Observed R hand pallor with wrist flexor stretch suggesting reduced circulation. Adjusted reps and hold time on exercises to encourage circulation and proceeded with nerve glides within tolerable ROM. Ended with MT to address muscle restriction. Patient tolerated session well and reported "it feels a lot better than when I got here."  OBJECTIVE IMPAIRMENTS: decreased ROM, decreased strength, impaired flexibility, impaired UE functional use, and pain.   ACTIVITY LIMITATIONS: carrying, lifting,  dressing, and reach over head  PARTICIPATION LIMITATIONS: meal prep, cleaning, community activity, occupation, and yard work  PERSONAL FACTORS: Age and Time since onset of injury/illness/exacerbation are also affecting patient's functional outcome.   REHAB POTENTIAL: Good  CLINICAL DECISION MAKING: Evolving/moderate complexity  EVALUATION COMPLEXITY: Moderate  PLAN:  PT FREQUENCY: 1x/week  PT DURATION: 4 weeks  PLANNED INTERVENTIONS: 97750- Physical Performance Testing, 97110-Therapeutic exercises, 97530- Therapeutic activity, [id number]- Neuromuscular re-education, 97535- Self Care, 02859- Manual therapy, [id number]- Gait training, [phone]- Orthotic Initial, [phone]- Splinting, [phone]- Electrical stimulation (manual), and 20560 (1-2 muscles), 20561 (3+ muscles)- Dry Needling  PLAN FOR NEXT SESSION: review HEP, soft tissue mobilization, nerve glides, monitor circulation in R hand     Louana Terrilyn Christians, PT, DPT 08/15/23 9:31 AM  [facility] at [facility] [address], [address] [place], [place] [address] Phone # [phone] Fax # [phone]

## 2023-08-15 ENCOUNTER — Ambulatory Visit: Admitting: Physical Therapy

## 2023-08-15 ENCOUNTER — Encounter: Payer: Self-pay | Admitting: Physical Therapy

## 2023-08-15 DIAGNOSIS — M6281 Muscle weakness (generalized): Secondary | ICD-10-CM

## 2023-08-15 DIAGNOSIS — M79601 Pain in right arm: Secondary | ICD-10-CM

## 2023-08-16 ENCOUNTER — Other Ambulatory Visit (INDEPENDENT_AMBULATORY_CARE_PROVIDER_SITE_OTHER): Payer: Self-pay | Admitting: Gastroenterology

## 2023-08-16 ENCOUNTER — Ambulatory Visit (HOSPITAL_COMMUNITY)
Admission: RE | Admit: 2023-08-16 | Discharge: 2023-08-16 | Disposition: A | Discharge status: Home / Self Care | Source: Ambulatory Visit | Attending: Thoracic Surgery (Cardiothoracic Vascular Surgery) | Admitting: Thoracic Surgery (Cardiothoracic Vascular Surgery)

## 2023-08-16 DIAGNOSIS — R911 Solitary pulmonary nodule: Secondary | ICD-10-CM | POA: Diagnosis present

## 2023-08-16 LAB — GLUCOSE, CAPILLARY: Glucose-Capillary: 111 mg/dL — ABNORMAL HIGH (ref 70–99)

## 2023-08-16 MED ORDER — FLUDEOXYGLUCOSE F - 18 (FDG) INJECTION
7.0000 | Freq: Once | INTRAVENOUS | Status: AC | PRN
  Administered 2023-08-16: 7 via INTRAVENOUS

## 2023-08-20 ENCOUNTER — Ambulatory Visit
Payer: Self-pay | Attending: Thoracic Surgery (Cardiothoracic Vascular Surgery) | Admitting: Thoracic Surgery (Cardiothoracic Vascular Surgery)

## 2023-08-20 ENCOUNTER — Encounter: Payer: Self-pay | Admitting: Thoracic Surgery (Cardiothoracic Vascular Surgery)

## 2023-08-20 VITALS — BP 120/80 | HR 65 | Resp 20 | Ht 68.0 in | Wt 156.0 lb

## 2023-08-20 DIAGNOSIS — R911 Solitary pulmonary nodule: Secondary | ICD-10-CM

## 2023-08-20 NOTE — Progress Notes (Signed)
 [address], [place] [address]             [phone]     HPI: Reginald [name] returns for scheduled follow-up visit  Reginald [name] is a 58 year old man with a history of tobacco abuse, COPD, lung nodule, reflux, irritable bowel syndrome, and coronary artery disease status post coronary bypass grafting.  He presented with chest pain.  Workup revealed severe three-vessel disease and a EF of 35 to 40%.  Also found to have a 1 cm right upper lobe lung nodule and an area of scarring.  Underwent coronary artery bypass grafting x 3 on 05/31/2023.  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  That did not require intervention.  He also had pneumothorax which required pigtail catheter placement.  He has been doing well.  He quit smoking.  Not having any significant incisional pain.  He has noted improving exercise tolerance.  He has been doing physical therapy for his right hand.  Had a PET to evaluate the right upper lobe lung nodule.  Intermittently notices some swelling in his left leg.  Past Medical History:  Diagnosis Date   Arthritis    GERD (gastroesophageal reflux disease)    Headache    migraines    IBS (irritable bowel syndrome)      Current Outpatient Medications  Medication Sig Dispense Refill   aspirin  EC 81 MG tablet Take 1 tablet (81 mg total) by mouth daily. Swallow whole.     clopidogrel  (PLAVIX ) 75 MG tablet Take 1 tablet (75 mg total) by mouth daily. 30 tablet 1   dicyclomine  (BENTYL ) 10 MG capsule TAKE 1 CAPSULE (10 MG TOTAL) BY MOUTH EVERY 12 (TWELVE) HOURS AS NEEDED (ABDOMINAL PAIN). 180 capsule 1   diphenoxylate -atropine  (LOMOTIL ) 2.5-0.025 MG tablet TAKE 1 TABLET BY MOUTH 4 (FOUR) TIMES DAILY AS NEEDED FOR DIARRHEA OR LOOSE STOOLS. 180 tablet 1   empagliflozin  (JARDIANCE ) 10 MG TABS tablet Take 1 tablet (10 mg total) by mouth daily. 30 tablet 1   furosemide  (LASIX ) 40 MG  tablet Take 1 tablet (40 mg total) by mouth daily. 30 tablet 3   gabapentin  (NEURONTIN ) 300 MG capsule Take 1 capsule (300 mg total) by mouth 2 (two) times daily. 60 capsule 0   losartan  (COZAAR ) 25 MG tablet Take 1 tablet (25 mg total) by mouth daily. 30 tablet 1   MAGNESIUM  PO Take 1 tablet by mouth at bedtime.     metoprolol  succinate (TOPROL -XL) 50 MG 24 hr tablet Take 1 tablet (50 mg total) by mouth at bedtime. Take with or immediately following a meal. 30 tablet 1   Multiple Vitamin (MULTIVITAMIN) tablet Take 1 tablet by mouth at bedtime.     nicotine  (NICODERM CQ  - DOSED IN MG/24 HOURS) 21 mg/24hr patch Place 1 patch (21 mg total) onto the skin daily. 28 patch 0   nicotine  polacrilex (NICORETTE ) 2 MG gum Take 1 each (2 mg total) by mouth as needed for smoking cessation. 100 tablet 0   ondansetron  (ZOFRAN ) 4 MG tablet TAKE 1 TABLET BY MOUTH EVERY 8 HOURS AS NEEDED FOR NAUSEA AND VOMITING 90 tablet 3   oxyCODONE  (OXY IR/ROXICODONE ) 5 MG immediate release tablet Take 1 tablet (5 mg total) by mouth every 6 (six) hours as needed for severe pain (pain score 7-10). 56 tablet 0   potassium chloride  SA (KLOR-CON  M) 20 MEQ  tablet Take 1 tablet (20 mEq total) by mouth daily. 90 tablet 1   promethazine  (PHENERGAN ) 12.5 MG tablet TAKE 1 TABLET BY MOUTH EVERY 6 HOURS AS NEEDED FOR NAUSEA OR VOMITING. 30 tablet 0   rosuvastatin  (CRESTOR ) 20 MG tablet Take 1 tablet (20 mg total) by mouth daily. 90 tablet 3   spironolactone  (ALDACTONE ) 25 MG tablet Take 1 tablet (25 mg total) by mouth daily. 30 tablet 1   sucralfate  (CARAFATE ) 1 g tablet Take 1 tablet (1 g total) by mouth 4 (four) times daily -  with meals and at bedtime. Dissolve tablet in 30ml of water, then drink slurry 120 tablet 1   sulfamethoxazole -trimethoprim  (BACTRIM  DS) 800-160 MG tablet Take 1 tablet by mouth 2 (two) times daily. 14 tablet 0   No current facility-administered medications for this visit.    Physical Exam BP 120/80   Pulse 65    Resp 20   Ht 5' 8 (1.727 m)   Wt 156 lb (70.8 kg)   SpO2 93% Comment: RA  BMI 23.72 kg/m  Well-appearing 58 year old man in no acute distress Alert and oriented x 3 with no focal deficits Lungs clear with equal breath sounds bilaterally Cardiac regular rate and rhythm Incisions well-healed No peripheral edema  Diagnostic Tests: NUCLEAR MEDICINE PET SKULL BASE TO THIGH   TECHNIQUE: 6.9 mCi F-18 FDG was injected intravenously. Full-ring PET imaging was performed from the skull base to thigh after the radiotracer. CT data was obtained and used for attenuation correction and anatomic localization.   Fasting blood glucose: 111 mg/dl   COMPARISON:  Chest x-ray 07/09/2023 and older. CTA chest abdomen pelvis 05/28/2023   FINDINGS: Mediastinal blood pool activity: SUV max 2.2   Liver activity: SUV max 2.2   NECK: No specific abnormal uptake seen in the neck including along the lymph node change of the submandibular, posterior triangle or internal jugular region. Near symmetric uptake of the visualized intracranial compartment.   Incidental CT findings: The parotid glands, submandibular glands and thyroid gland are unremarkable. Scattered vascular calcifications are seen. Mastoid air cells are clear. There is some mucosal thickening along paranasal sinuses. Rounded area in left maxillary sinus could be a small mucous retention cyst. Bilateral middle turbinate concha bullosa. Leftward nasal septal deviation with a left-sided spur.   CHEST: Mild uptake along the defect for the median sternotomy. No specific abnormal uptake above blood pool in the axillary regions, hilum or mediastinum. On the prior CT scan pole there is a right apical nodular focus which is stable today on image 49 of series 4. 10 x 5 mm in diameter. This lesion does not show abnormal radiotracer uptake. Maximum SUV of 0.7. Adjacent spiculation and pleural thickening as well. Favor a benign process.    Incidental CT findings: Breathing motion. Diffuse centrilobular emphysematous lung changes identified. No consolidation, pneumothorax or effusion. Basilar atelectasis on the right side or scar. No pericardial effusion. Scattered vascular calcifications along the aorta and branch vessels.   ABDOMEN/PELVIS: No abnormal hypermetabolic activity within the liver, pancreas, adrenal glands, or spleen. No hypermetabolic lymph nodes in the abdomen or pelvis.   Incidental CT findings: On this limited noncontrast attenuation correction CT, grossly the liver, spleen, adrenal glands and pancreas are unremarkable. Gallbladder is present. Punctate nonobstructing upper pole right-sided renal stone. No ureteral stones or left-sided renal stone. Preserved contour to the urinary bladder. Calcifications along the prostate. Large bowel has a normal course and caliber with scattered stool. Left-sided colonic diverticula. Normal caliber aorta  and IVC. Scattered vascular calcifications. Small bowel is nondilated.   SKELETON: No focal hypermetabolic activity to suggest skeletal metastasis.   Incidental CT findings: Scattered degenerative changes.   IMPRESSION: Right apical spiculated nodule with pleural thickening and scarring in is again seen and stable. This is not show abnormal uptake. Simple follow up surveillance is recommended in 6 months if there is no remote prior.   No additional areas of abnormal uptake identified along soft tissue or lymph nodes.   Emphysematous lung changes.  Postop chest.   Punctate nonobstructing right-sided renal stone. Colonic diverticula.     Electronically Signed   By: Ranell Bring M.D.   On: 08/19/2023 12:23 I personally reviewed the CT images.  No significant change in the right upper lobe nodular opacity.  No significant metabolic activity.  Impression: Reginald [name] is a 58 year old man with a history of tobacco abuse, COPD, lung nodule, reflux, irritable  bowel syndrome, and coronary artery disease status post coronary bypass grafting.  CAD status post CABG-no recurrent angina.  Doing well.  Exercise tolerance is excellent.  No restrictions on his activities.  Right upper lobe lung nodule-not active on PET.  I reviewed the images with Reginald [name].  They understand that the PET does not definitively rule out cancer, but is encouraging.  He needs continued close observation.  Will plan a CT in 3 months.  Will need to be followed for at least 2 years.  Tobacco abuse-congratulated him on quitting smoking.  Emphasized the importance of continued abstinence.  Plan: Return in 3 months with CT chest  Elspeth JAYSON Millers, MD [facility] [phone]

## 2023-08-22 ENCOUNTER — Ambulatory Visit

## 2023-08-23 ENCOUNTER — Telehealth (HOSPITAL_COMMUNITY): Payer: Self-pay

## 2023-08-23 NOTE — Telephone Encounter (Signed)
 Pt insurance is active and benefits verified through [employer] [facility]. Co-pay $0, DED $1,100/$1,100 met, out of pocket $9,200/$3,667.89 met, co-insurance 20%. No pre-authorization required. 08/23/2023 @ 10:00am, spoke with Wanda, REF# [id number].  How many CR sessions are covered? (36 visits for TCR, 72 visits for ICR)72 ICR Is this a lifetime maximum or an annual maximum? Annual Has the member used any of these services to date? No Is there a time limit (weeks/months) on start of program and/or program completion? No

## 2023-08-23 NOTE — Telephone Encounter (Signed)
 Called patient to see if he was interested in participating in the Cardiac Rehab Program. Patient will come in for orientation on 8/12@8am  and will attend the 8:15 exercise class.   Sent package.

## 2023-08-27 ENCOUNTER — Ambulatory Visit: Attending: Thoracic Surgery (Cardiothoracic Vascular Surgery)

## 2023-08-27 DIAGNOSIS — M6281 Muscle weakness (generalized): Secondary | ICD-10-CM | POA: Diagnosis present

## 2023-08-27 DIAGNOSIS — M79601 Pain in right arm: Secondary | ICD-10-CM | POA: Insufficient documentation

## 2023-08-27 NOTE — Therapy (Signed)
 OUTPATIENT PHYSICAL THERAPY NEURO TREATMENT   Patient Name: Reginald [name] MRN: [id number] DOB:Dec 02, 1965, 58 y.o., male Today's Date: 08/27/2023   PCP: Anita Bernardino BROCKS, FNP REFERRING PROVIDER: Kerrin Elspeth BROCKS, MD  END OF SESSION:  PT End of Session - 08/27/23 0755     Visit Number 3    Number of Visits 5    Date for PT Re-Evaluation 09/12/23    Authorization Type [employer]    PT Start Time 0800    PT Stop Time 0845    PT Time Calculation (min) 45 min    Activity Tolerance Patient tolerated treatment well    Behavior During Therapy WFL for tasks assessed/performed           Past Medical History:  Diagnosis Date   Arthritis    GERD (gastroesophageal reflux disease)    Headache    migraines    IBS (irritable bowel syndrome)    Past Surgical History:  Procedure Laterality Date   BIOPSY  11/17/2021   Procedure: BIOPSY;  Surgeon: Eartha Angelia Sieving, MD;  Location: [facility];  Service: Gastroenterology;;   COLONOSCOPY WITH PROPOFOL  N/A 11/17/2021   Procedure: COLONOSCOPY WITH PROPOFOL ;  Surgeon: Eartha Angelia Sieving, MD;  Location: [facility];  Service: Gastroenterology;  Laterality: N/A;  200 ASA 2   CORONARY ARTERY BYPASS GRAFT N/A 05/31/2023   Procedure: CORONARY ARTERY BYPASS GRAFTING (CABG) TIMES THREE USING LEFT INTERNAL MAMMARY ARTERY AND OPENLY HARVESTED RIGHT AND LEFT GREATER SAPHENOUS VEIN;  Surgeon: Kerrin Elspeth BROCKS, MD;  Location: [facility] OR;  Service: Open Heart Surgery;  Laterality: N/A;   ESOPHAGOGASTRODUODENOSCOPY N/A 04/24/2023   Procedure: EGD (ESOPHAGOGASTRODUODENOSCOPY);  Surgeon: Eartha Angelia, Sieving, MD;  Location: [facility];  Service: Gastroenterology;  Laterality: N/A;  9:15AM;ASA 2   ESOPHAGOGASTRODUODENOSCOPY (EGD) WITH PROPOFOL  N/A 11/17/2021   Procedure: ESOPHAGOGASTRODUODENOSCOPY (EGD) WITH PROPOFOL ;  Surgeon: Eartha Angelia Sieving, MD;  Location: [facility];  Service: Gastroenterology;  Laterality: N/A;   INGUINAL  HERNIA REPAIR Right 06/02/2015   Procedure: RIGHT INGUINAL HERNIA REPAIR WITH MESH;  Surgeon: Camellia Blush, MD;  Location[facility] OR;  Service: General;  Laterality: Right;   INSERTION OF MESH Right 06/02/2015   Procedure: INSERTION OF MESH;  Surgeon: Camellia Blush, MD;  Location: [facility] OR;  Service: General;  Laterality: Right;   INTRAOPERATIVE TRANSESOPHAGEAL ECHOCARDIOGRAM N/A 05/31/2023   Procedure: ECHOCARDIOGRAM, TRANSESOPHAGEAL, INTRAOPERATIVE;  Surgeon: Kerrin Elspeth BROCKS, MD;  Location: [facility] OR;  Service: Open Heart Surgery;  Laterality: N/A;   LEFT HEART CATH AND CORONARY ANGIOGRAPHY N/A 05/29/2023   Procedure: LEFT HEART CATH AND CORONARY ANGIOGRAPHY;  Surgeon: [place], Peter M, MD;  Location: [facility] INVASIVE CV LAB;  Service: Cardiovascular;  Laterality: N/A;   NO PAST SURGERIES     POLYPECTOMY  11/17/2021   Procedure: POLYPECTOMY;  Surgeon: Eartha Angelia Sieving, MD;  Location: [facility];  Service: Gastroenterology;;   Patient Active Problem List   Diagnosis Date Noted   S/P CABG (coronary artery bypass graft) 05/31/2023   Unstable angina (HCC) 05/28/2023   Pulmonary nodule 05/28/2023   Essential hypertension 05/28/2023   IBS (irritable bowel syndrome) 05/28/2023   Melena 04/01/2023   Abdominal pain, epigastric 04/01/2023   Gastroesophageal reflux disease 01/01/2023   Helicobacter pylori gastritis 12/07/2021   Nausea and vomiting 09/04/2021   Chronic diarrhea 09/04/2021    ONSET DATE: May 2025  REFERRING DIAG:  R29.898 (ICD-10-CM) - Weakness of right arm    THERAPY DIAG:  Muscle weakness (generalized)  Pain in  right arm  Rationale for Evaluation and Treatment: Rehabilitation  SUBJECTIVE:                                                                                                                                                                                             SUBJECTIVE STATEMENT: Doing ok, the right hand/arm feels about 65% better. Still have a throbbing  feeling in the evening    Pt accompanied by: significant other, kids  PERTINENT HISTORY: seen by the cardiology NP on 07/05/2023. No changes were made to his medications. We reviewed driving, participation in cardiac rehab, and sternal precautions (no lifting more than 10 pounds until after 07/31/2023.) He will continued to be followed by cardiology indefinitely. He will need a CT and follow up for the irregular nodule of the right pulmonary apex measuring 1.0 x 0.5 cm. PET CT and follow up will be arranged with Dr. Kerrin. Regarding right forearm pain/weakness, referral to PT.  PAIN:  Are you having pain? Yes: NPRS scale: 5-6/10 Pain location: anterior right forearm Pain description: throb Aggravating factors: using RUE Relieving factors: shaking, elevating overhead  PRECAUTIONS: Sternal  RED FLAGS: None   WEIGHT BEARING RESTRICTIONS: No  FALLS: Has patient fallen in last 6 months? No  LIVING ENVIRONMENT: Lives with: lives with their family Lives in: House/apartment Stairs:   Has following equipment at home: None  PLOF: Independent  PATIENT GOALS:   OBJECTIVE:    TODAY'S TREATMENT: 08/27/23 Activity Comments  Manual muscle test: R 4/5 wrist flexion/extension  Right grip strength 60 lbs  Clothespins all colors   Gripper block transfer 2x10 Black spring, lowest setting  Suitcase carry x 2 min 15# KB; fatigue at 1 min, 30 sec, 30 sec--notes increased feeling of tightness in forearm (feels like a vise)  Appreciate diminished radial pulse on right wrist vs left   MHP to right hand/wrist x 5 min   Soft tissue mobilization right hand/wrist, static stretching, assisted nerve glide median/ulnar      TODAY'S TREATMENT: 08/14/23 Activity Comments  review of HEP: wrist flexor stretch on table 5x10 pronation/supination with weight 4# finger lumbricals w/ putty key pinch w/ putty With wrist flexro stretch- pt's R dorsal hand became pale as if reduced circiulation however  denies pain. Adjusted reps to 5x5 instead.   Pt restricted in pronation ROM and limited control of 4#. Cues to move within tolerable ROM  Verbal review of putty exercises   R median nerve glide 2x5  Cueing for form. Restricted elbow and wrist   R radial nerve glide 2x5  Cueing for form. Some restriction in wrist flexion  STM and manual TPR R forearm Very tight and tender in wrist flexors/extensors and supinator. Improved restriction and pain after MT           HOME EXERCISE PROGRAM Last updated: 08/15/23 Access Code: [email or web address] URL: [email or web address] Date: 08/15/2023 Prepared by: [facility] - Outpatient  Rehab - [facility]  Exercises - Wrist Flexor Stretch on Table  - 1 x daily - 7 x weekly - 3 sets - 5 reps - 5 sec hold - Forearm Pronation and Supination with Hammer  - 1 x daily - 7 x weekly - 3 sets - 10 reps - Finger Lumbricals with Putty  - 1 x daily - 7 x weekly - 3 sets - 10 reps - Key Pinch with Putty  - 1 x daily - 7 x weekly - 3 sets - 10 reps - Radial Nerve Flossing  - 1 x daily - 5 x weekly - 2 sets - 10 reps - Median Nerve Flossing - Tray  - 1 x daily - 5 x weekly - 2 sets - 10 reps   PATIENT EDUCATION: Education details: adjustment to HEP and encouraged pt to monitor R hand pallor  Person educated: Patient and Spouse Education method: Explanation, Demonstration, Tactile cues, Verbal cues, and Handouts Education comprehension: verbalized understanding and returned demonstration     Note: Objective measures were completed at Evaluation unless otherwise noted.  DIAGNOSTIC FINDINGS:   COGNITION: Overall cognitive status: Within functional limits for tasks assessed   SENSATION: Numbness/tingling right anterior forearm radial are, extending to thumb and 2-3 fingers  Grip strength: left grip 80#; right grip: 33# average (10/10 pain with grip) Pinch strength: left pinch 20#, right pinch 10# average     DTRs:  Biceps 2+ = Normal and  Brachioradialis 0 = Absent  POSTURE: No Significant postural limitations  Right wrist ROM/strength:  wrist flexion 30 deg, wrist ext 20 deg Right Wrist extension strength 3/5  Right wrist flexion: 4/5 Finger abduction: 3-/5 Thumb adduction: DIP flexion with paper pull Appreciate atrophy to dorsal interossei  Pronation 3/5                                                                                                                               TREATMENT DATE: 08/08/23    PATIENT EDUCATION: Education details: assessment findings, HEP initiation Person educated: Patient and Spouse Education method: Explanation Education comprehension: verbalized understanding  HOME EXERCISE PROGRAM: Access Code: [email or web address] URL: [email or web address] Date: 08/08/2023 Prepared by: Kelly [place] Severs  Exercises - Wrist Flexor Stretch on Table  - 1 x daily - 7 x weekly - 3 sets - 10 reps - Forearm Pronation and Supination with Hammer  - 1 x daily - 7 x weekly - 3 sets - 10 reps - Finger Lumbricals with Putty  - 1 x daily - 7 x weekly - 3 sets - 10 reps - Key Pinch with Putty  - 1 x daily - 7 x weekly -  3 sets - 10 reps  GOALS: Goals reviewed with patient? Yes  SHORT TERM GOALS: Target date: same as LTG    LONG TERM GOALS: Target date: 08/08/23  Patient will be independent in HEP to improve functional outcomes Baseline:  Goal status: IN PROGRESS  2.  Improve grip strength to 45# right hand to improve grasp and ability to hold cups Baseline: 33# Goal status: IN PROGRESS  3.  Increase wrist extension strength 4/5 for improved grip Baseline: 3/5 Goal status: IN PROGRESS  4.  Pain in right forearm 2/10 with gripping tasks to improve activity tolerance Baseline: 7/10 Goal status: IN PROGRESS    ASSESSMENT:  CLINICAL IMPRESSION: Increased grip strength evident right hand to 60# on dynamometer and forearm flexors/extensors 4/5.  Notes limited grip endurance with increase in  fatigue and sensation of pressure.  Appreciate weak radial pulse on involved right with very easily palpated radial on LUE.  Capillary refill seems to be less brisk on radial a. Distribution right hand. Continued with gross and fine motor tasks to improve function. Continued session to advance POC details  OBJECTIVE IMPAIRMENTS: decreased ROM, decreased strength, impaired flexibility, impaired UE functional use, and pain.   ACTIVITY LIMITATIONS: carrying, lifting, dressing, and reach over head  PARTICIPATION LIMITATIONS: meal prep, cleaning, community activity, occupation, and yard work  PERSONAL FACTORS: Age and Time since onset of injury/illness/exacerbation are also affecting patient's functional outcome.   REHAB POTENTIAL: Good  CLINICAL DECISION MAKING: Evolving/moderate complexity  EVALUATION COMPLEXITY: Moderate  PLAN:  PT FREQUENCY: 1x/week  PT DURATION: 4 weeks  PLANNED INTERVENTIONS: 97750- Physical Performance Testing, 97110-Therapeutic exercises, 97530- Therapeutic activity, [id number]- Neuromuscular re-education, 97535- Self Care, 02859- Manual therapy, [id number]- Gait training, [phone]- Orthotic Initial, [id number]- Splinting, [id number]- Electrical stimulation (manual), and 20560 (1-2 muscles), 20561 (3+ muscles)- Dry Needling  PLAN FOR NEXT SESSION: review HEP, soft tissue mobilization, nerve glides, monitor circulation in R hand     8:47 AM, 08/27/23 M. Kelly Linzie Boursiquot, PT, DPT Physical Therapist- [place] Office Number: [phone]

## 2023-08-30 ENCOUNTER — Encounter: Payer: Self-pay | Admitting: Thoracic Surgery (Cardiothoracic Vascular Surgery)

## 2023-09-03 ENCOUNTER — Encounter (HOSPITAL_COMMUNITY)
Admission: RE | Admit: 2023-09-03 | Discharge: 2023-09-03 | Disposition: A | Discharge status: Home / Self Care | Source: Ambulatory Visit | Attending: Internal Medicine | Admitting: Internal Medicine

## 2023-09-03 ENCOUNTER — Telehealth (HOSPITAL_COMMUNITY): Payer: Self-pay

## 2023-09-03 VITALS — BP 140/70 | HR 66 | Ht 68.0 in | Wt 161.8 lb

## 2023-09-03 DIAGNOSIS — Z951 Presence of aortocoronary bypass graft: Secondary | ICD-10-CM | POA: Diagnosis present

## 2023-09-03 NOTE — Progress Notes (Signed)
 Cardiac Rehab Medication Review   Does the patient  feel that his/her medications are working for him/her?   Yes Has the patient been experiencing any side effects to the medications prescribed?   No  Does the patient measure his/her own blood pressure or blood glucose at home?  No, pt is checking his weight everyday at home  Does the patient have any problems obtaining medications due to transportation or finances?    No  Understanding of regimen: excellent Understanding of indications: excellent Potential of compliance: excellent    Comments: Meds reviewed   Garen FORBES Candy, MS, ACSM-CEP  09/03/2023  8:55 AM

## 2023-09-03 NOTE — Progress Notes (Signed)
 Cardiac Individual Treatment Plan  Patient Details  Name: Reginald [name] MRN: [id number] Date of Birth: [birth year] Referring Provider:   Flowsheet Row INTENSIVE CARDIAC REHAB ORIENT from 09/03/2023 in [facility]  Referring Provider Dr. 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 Swallow whole., Disp: , Rfl:    clopidogrel  (PLAVIX ) 75 MG tablet, Take 1 tablet (75 mg total) by mouth daily., Disp: 30 tablet, Rfl: 1   dicyclomine  (BENTYL ) 10 MG capsule, TAKE 1 CAPSULE (10 MG TOTAL) BY MOUTH EVERY 12 (TWELVE) HOURS AS NEEDED (ABDOMINAL PAIN)., Disp: 180 capsule, Rfl: 1   diphenoxylate -atropine  (LOMOTIL ) 2.5-0.025 MG tablet, TAKE 1 TABLET BY MOUTH 4 (FOUR) TIMES DAILY AS NEEDED FOR DIARRHEA OR LOOSE STOOLS., Disp: 180 tablet, Rfl: 1   empagliflozin  (JARDIANCE ) 10 MG TABS tablet, Take 1 tablet (10 mg total) by mouth daily., Disp: 30 tablet, Rfl: 1   furosemide  (LASIX ) 40 MG tablet, Take 1 tablet (40 mg total) by mouth daily., Disp: 30 tablet, Rfl: 3   gabapentin  (NEURONTIN ) 300 MG capsule, Take 1 capsule (300 mg total) by mouth 2 (two) times daily., Disp: 60 capsule, Rfl: 0   losartan  (COZAAR ) 25 MG tablet, Take 1 tablet (25 mg total) by mouth daily., Disp: 30 tablet, Rfl: 1   MAGNESIUM  PO, Take 1 tablet by mouth at bedtime., Disp: , Rfl:    metoprolol  succinate (TOPROL -XL) 50 MG 24 hr tablet, Take 1 tablet (50 mg total) by mouth at bedtime. Take with or immediately following a meal., Disp: 30 tablet, Rfl: 1   Multiple Vitamin (MULTIVITAMIN) tablet, Take 1 tablet by mouth at bedtime., Disp: , Rfl:     nicotine  (NICODERM CQ  - DOSED IN MG/24 HOURS) 21 mg/24hr patch, Place 1 patch (21 mg total) onto the skin daily., Disp: 28 patch, Rfl: 0   oxyCODONE  (OXY IR/ROXICODONE ) 5 MG immediate release tablet, Take 1 tablet (5 mg total) by mouth every 6 (six) hours as needed for severe pain (pain score 7-10)., Disp: 56 tablet, Rfl: 0   potassium chloride  SA (KLOR-CON  M) 20 MEQ tablet, Take 1 tablet (20 mEq total) by mouth daily., Disp: 90 tablet, Rfl: 1   promethazine  (PHENERGAN ) 12.5 MG tablet, TAKE 1 TABLET BY MOUTH EVERY 6 HOURS AS NEEDED FOR NAUSEA OR VOMITING., Disp: 30 tablet, Rfl: 0   rosuvastatin  (CRESTOR ) 20 MG tablet, Take 1 tablet (20 mg total) by mouth daily., Disp: 90 tablet, Rfl: 3   spironolactone  (ALDACTONE ) 25 MG tablet, Take 1 tablet (25 mg total) by mouth daily., Disp: 30 tablet, Rfl: 1   sucralfate  (CARAFATE ) 1 g tablet, Take 1 tablet (1 g total) by mouth 4 (four) times daily -  with meals and at bedtime. Dissolve tablet in 30ml of water, then drink slurry, Disp: 120 tablet, Rfl: 1   sulfamethoxazole -trimethoprim  (BACTRIM  DS) 800-160 MG tablet, Take 1 tablet by mouth 2 (two) times daily., Disp: 14 tablet, Rfl: 0   nicotine  polacrilex (NICORETTE ) 2 MG gum, Take 1 each (2 mg total) by mouth as needed for smoking cessation. (Patient not taking: Reported on 09/03/2023), Disp: 100 tablet, Rfl: 0  ondansetron  (ZOFRAN ) 4 MG tablet, TAKE 1 TABLET BY MOUTH EVERY 8 HOURS AS NEEDED FOR NAUSEA AND VOMITING, Disp: 90 tablet, Rfl: 3  Past Medical History: Past Medical History:  Diagnosis Date   Arthritis    GERD (gastroesophageal reflux disease)    Headache    migraines    IBS (irritable bowel syndrome)     Tobacco Use: Social History   Tobacco Use  Smoking Status Former   Types: Cigarettes   Passive exposure: Current  Smokeless Tobacco Former    Labs: Review Flowsheet       Latest Ref Rng & Units 05/29/2023 05/30/2023 05/31/2023 06/01/2023  Labs for ITP Cardiac and Pulmonary Rehab   Cholestrol 0 - 200 mg/dL - 776  - -  LDL (calc) 0 - 99 mg/dL - 837  - -  HDL-C >59 mg/dL - 38  - -  Trlycerides <150 mg/dL - 883  - -  Hemoglobin A1c 4.8 - 5.6 % 5.4  - - -  PH, Arterial 7.35 - 7.45 - 7.46  7.356  7.296  7.295  7.356  7.296  7.384   PCO2 arterial 32 - 48 mmHg - 35  41.8  42.3  45.1  40.1  44.1  35.8   Bicarbonate 20.0 - 28.0 mmol/L - 24.9  23.6  20.8  22.0  24.2  22.4  21.5  21.1   TCO2 22 - 32 mmol/L - - 25  22  22  23  23  24  25  24  26  23  26  22    Acid-base deficit 0.0 - 2.0 mmol/L - - 2.0  6.0  4.0  1.0  3.0  5.0  3.0   O2 Saturation % - 98.8  100  100  100  73  100  100  95     Details       Multiple values from one day are sorted in reverse-chronological order         Capillary Blood Glucose: Lab Results  Component Value Date   GLUCAP 111 (H) 08/16/2023   GLUCAP 128 (H) 06/05/2023   GLUCAP 121 (H) 06/05/2023   GLUCAP 127 (H) 06/04/2023   GLUCAP 106 (H) 06/04/2023     Exercise Target Goals: Exercise Program Goal: Individual exercise prescription set using results from initial 6 min walk test and THRR while considering  patient's activity barriers and safety.   Exercise Prescription Goal: Initial exercise prescription builds to 30-45 minutes a day of aerobic activity, 2-3 days per week.  Home exercise guidelines will be given to patient during program as part of exercise prescription that the participant will acknowledge.  Activity Barriers & Risk Stratification:  Activity Barriers & Cardiac Risk Stratification - 09/03/23 0904       Activity Barriers & Cardiac Risk Stratification   Activity Barriers Arthritis;Deconditioning;Decreased Ventricular Function    Cardiac Risk Stratification High          6 Minute Walk:  6 Minute Walk     Row Name 09/03/23 1323         6 Minute Walk   Phase Initial     Distance 1461 feet     Walk Time 6 minutes     # of Rest Breaks 0     MPH 2.77     METS 4.45     RPE 11     Perceived Dyspnea  1      VO2 Peak 15.57     Symptoms No  Resting HR 66 bpm     Resting BP 140/70     Resting Oxygen Saturation  98 %     Max Ex. HR 110 bpm     Max Ex. BP 164/64     2 Minute Post BP 120/68        Oxygen Initial Assessment:   Oxygen Re-Evaluation:   Oxygen Discharge (Final Oxygen Re-Evaluation):   Initial Exercise Prescription:  Initial Exercise Prescription - 09/03/23 0900       Date of Initial Exercise RX and Referring Provider   Date 09/03/23    Referring Provider Dr. Vishnu Mallipeddii    Expected Discharge Date 11/27/23      Treadmill   MPH 1.8    Grade 0    Minutes 15    METs 2.4      Recumbant Elliptical   Level 2    RPM 60    Watts 67    Minutes 15    METs 2.9      Prescription Details   Frequency (times per week) 3 days/week    Duration Progress to 30 minutes of continuous aerobic without signs/symptoms of physical distress      Intensity   THRR 40-80% of Max Heartrate 65-131    Ratings of Perceived Exertion 11-13    Perceived Dyspnea 0-4      Progression   Progression Continue to progress workloads to maintain intensity without signs/symptoms of physical distress.      Resistance Training   Training Prescription Yes    Weight 4lbs    Reps 10-15          Perform Capillary Blood Glucose checks as needed.  Exercise Prescription Changes:   Exercise Comments:   Exercise Comments     Row Name 09/03/23 0907           Exercise Comments Pt orientation, pt completed w/o unusual signs/symptoms.          Exercise Goals and Review:   Exercise Goals     Row Name 09/03/23 9177             Exercise Goals   Increase Physical Activity Yes       Intervention Provide advice, education, support and counseling about physical activity/exercise needs.;Develop an individualized exercise prescription for aerobic and resistive training based on initial evaluation findings, risk stratification, comorbidities and participant's personal  goals.       Expected Outcomes Short Term: Attend rehab on a regular basis to increase amount of physical activity.;Long Term: Exercising regularly at least 3-5 days a week.;Long Term: Add in home exercise to make exercise part of routine and to increase amount of physical activity.       Increase Strength and Stamina Yes       Intervention Provide advice, education, support and counseling about physical activity/exercise needs.;Develop an individualized exercise prescription for aerobic and resistive training based on initial evaluation findings, risk stratification, comorbidities and participant's personal goals.       Expected Outcomes Short Term: Increase workloads from initial exercise prescription for resistance, speed, and METs.;Short Term: Perform resistance training exercises routinely during rehab and add in resistance training at home;Long Term: Improve cardiorespiratory fitness, muscular endurance and strength as measured by increased METs and functional capacity ( )       Able to understand and use rate of perceived exertion (RPE) scale Yes       Intervention Provide education and explanation on how to use RPE scale  Expected Outcomes Short Term: Able to use RPE daily in rehab to express subjective intensity level;Long Term:  Able to use RPE to guide intensity level when exercising independently       Knowledge and understanding of Target Heart Rate Range (THRR) Yes       Intervention Provide education and explanation of THRR including how the numbers were predicted and where they are located for reference       Expected Outcomes Short Term: Able to state/look up THRR;Long Term: Able to use THRR to govern intensity when exercising independently;Short Term: Able to use daily as guideline for intensity in rehab       Understanding of Exercise Prescription Yes       Intervention Provide education, explanation, and written materials on patient's individual exercise prescription        Expected Outcomes Short Term: Able to explain program exercise prescription;Long Term: Able to explain home exercise prescription to exercise independently          Exercise Goals Re-Evaluation :   Discharge Exercise Prescription (Final Exercise Prescription Changes):   Nutrition:  Target Goals: Understanding of nutrition guidelines, daily intake of sodium 1500mg , cholesterol 200mg , calories 30% from fat and 7% or less from saturated fats, daily to have 5 or more servings of fruits and vegetables.  Biometrics:   Post Biometrics - 09/03/23 0930        Post  Biometrics   Waist Circumference 35 inches    Hip Circumference 37 inches    Waist to Hip Ratio 0.95 %    Triceps Skinfold 7 mm    % Body Fat 20.6 %    Grip Strength 26 kg    Flexibility 16.75 in    Single Leg Stand 30 seconds          Nutrition Therapy Plan and Nutrition Goals:   Nutrition Assessments:  MEDIFICTS Score Key: >=70 Need to make dietary changes  40-70 Heart Healthy Diet <= 40 Therapeutic Level Cholesterol Diet    Picture Your Plate Scores: <59 Unhealthy dietary pattern with much room for improvement. 41-50 Dietary pattern unlikely to meet recommendations for good health and room for improvement. 51-60 More healthful dietary pattern, with some room for improvement.  >60 Healthy dietary pattern, although there may be some specific behaviors that could be improved.    Nutrition Goals Re-Evaluation:   Nutrition Goals Re-Evaluation:   Nutrition Goals Discharge (Final Nutrition Goals Re-Evaluation):   Psychosocial: Target Goals: Acknowledge presence or absence of significant depression and/or stress, maximize coping skills, provide positive support system. Participant is able to verbalize types and ability to use techniques and skills needed for reducing stress and depression.  Initial Review & Psychosocial Screening:  Initial Psych Review & Screening - 09/03/23 0910       Initial Review    Current issues with None Identified      Family Dynamics   Good Support System? Yes      Barriers   Psychosocial barriers to participate in program There are no identifiable barriers or psychosocial needs.      Screening Interventions   Interventions Encouraged to exercise;Provide feedback about the scores to participant    Expected Outcomes Short Term goal: Utilizing psychosocial counselor, staff and physician to assist with identification of specific Stressors or current issues interfering with healing process. Setting desired goal for each stressor or current issue identified.;Long Term Goal: Stressors or current issues are controlled or eliminated.;Short Term goal: Identification and review with participant of any  Quality of Life or Depression concerns found by scoring the questionnaire.;Long Term goal: The participant improves quality of Life and PHQ9 Scores as seen by post scores and/or verbalization of changes          Quality of Life Scores:  Quality of Life - 09/03/23 1233       Quality of Life   Select Quality of Life      Quality of Life Scores   Health/Function Pre 26.8 %    Socioeconomic Pre 25.36 %    Psych/Spiritual Pre 30 %    Family Pre 30 %    GLOBAL Pre 27.63 %         Scores of 19 and below usually indicate a poorer quality of life in these areas.  A difference of  2-3 points is a clinically meaningful difference.  A difference of 2-3 points in the total score of the Quality of Life Index has been associated with significant improvement in overall quality of life, self-image, physical symptoms, and general health in studies assessing change in quality of life.  PHQ-9: Review Flowsheet       09/03/2023 06/10/2017 04/29/2015  Depression screen PHQ 2/9  Decreased Interest 0 0 0  Down, Depressed, Hopeless 0 0 0  PHQ - 2 Score 0 0 0  Altered sleeping 0 - -  Tired, decreased energy 0 - -  Change in appetite 0 - -  Feeling bad or failure about yourself  0 -  -  Trouble concentrating 0 - -  Moving slowly or fidgety/restless 0 - -  Suicidal thoughts 0 - -  PHQ-9 Score 0 - -  Difficult doing work/chores Not difficult at all - -   Interpretation of Total Score  Total Score Depression Severity:  1-4 = Minimal depression, 5-9 = Mild depression, 10-14 = Moderate depression, 15-19 = Moderately severe depression, 20-27 = Severe depression   Psychosocial Evaluation and Intervention:   Psychosocial Re-Evaluation:   Psychosocial Discharge (Final Psychosocial Re-Evaluation):   Vocational Rehabilitation: Provide vocational rehab assistance to qualifying candidates.   Vocational Rehab Evaluation & Intervention:  Vocational Rehab - 09/03/23 0857       Initial Vocational Rehab Evaluation & Intervention   Assessment shows need for Vocational Rehabilitation No   Pt is currently employed as a Adult nurse         Education: Education Goals: Education classes will be provided on a weekly basis, covering required topics. Participant will state understanding/return demonstration of topics presented.     Core Videos: Exercise    Move It!  Clinical staff conducted group or individual video education with verbal and written material and guidebook.  Patient learns the recommended Pritikin exercise program. Exercise with the goal of living a long, healthy life. Some of the health benefits of exercise include controlled diabetes, healthier blood pressure levels, improved cholesterol levels, improved heart and lung capacity, improved sleep, and better body composition. Everyone should speak with their doctor before starting or changing an exercise routine.  Biomechanical Limitations Clinical staff conducted group or individual video education with verbal and written material and guidebook.  Patient learns how biomechanical limitations can impact exercise and how we can mitigate and possibly overcome limitations to have an impactful and  balanced exercise routine.  Body Composition Clinical staff conducted group or individual video education with verbal and written material and guidebook.  Patient learns that body composition (ratio of muscle mass to fat mass) is a key component to assessing overall  fitness, rather than body weight alone. Increased fat mass, especially visceral belly fat, can put us  at increased risk for metabolic syndrome, type 2 diabetes, heart disease, and even death. It is recommended to combine diet and exercise (cardiovascular and resistance training) to improve your body composition. Seek guidance from your physician and exercise physiologist before implementing an exercise routine.  Exercise Action Plan Clinical staff conducted group or individual video education with verbal and written material and guidebook.  Patient learns the recommended strategies to achieve and enjoy long-term exercise adherence, including variety, self-motivation, self-efficacy, and positive decision making. Benefits of exercise include fitness, good health, weight management, more energy, better sleep, less stress, and overall well-being.  Medical   Heart Disease Risk Reduction Clinical staff conducted group or individual video education with verbal and written material and guidebook.  Patient learns our heart is our most vital organ as it circulates oxygen, nutrients, white blood cells, and hormones throughout the entire body, and carries waste away. Data supports a plant-based eating plan like the Pritikin Program for its effectiveness in slowing progression of and reversing heart disease. The video provides a number of recommendations to address heart disease.   Metabolic Syndrome and Belly Fat  Clinical staff conducted group or individual video education with verbal and written material and guidebook.  Patient learns what metabolic syndrome is, how it leads to heart disease, and how one can reverse it and keep it from coming  back. You have metabolic syndrome if you have 3 of the following 5 criteria: abdominal obesity, high blood pressure, high triglycerides, low HDL cholesterol, and high blood sugar.  Hypertension and Heart Disease Clinical staff conducted group or individual video education with verbal and written material and guidebook.  Patient learns that high blood pressure, or hypertension, is very common in the [place] . Hypertension is largely due to excessive salt intake, but other important risk factors include being overweight, physical inactivity, drinking too much alcohol, smoking, and not eating enough potassium from fruits and vegetables. High blood pressure is a leading risk factor for heart attack, stroke, congestive heart failure, dementia, kidney failure, and premature death. Long-term effects of excessive salt intake include stiffening of the arteries and thickening of heart muscle and organ damage. Recommendations include ways to reduce hypertension and the risk of heart disease.  Diseases of Our Time - Focusing on Diabetes Clinical staff conducted group or individual video education with verbal and written material and guidebook.  Patient learns why the best way to stop diseases of our time is prevention, through food and other lifestyle changes. Medicine (such as prescription pills and surgeries) is often only a Band-Aid on the problem, not a long-term solution. Most common diseases of our time include obesity, type 2 diabetes, hypertension, heart disease, and cancer. The Pritikin Program is recommended and has been proven to help reduce, reverse, and/or prevent the damaging effects of metabolic syndrome.  Nutrition   Overview of the Pritikin Eating Plan  Clinical staff conducted group or individual video education with verbal and written material and guidebook.  Patient learns about the Pritikin Eating Plan for disease risk reduction. The Pritikin Eating Plan emphasizes a wide variety of  unrefined, minimally-processed carbohydrates, like fruits, vegetables, whole grains, and legumes. Go, Caution, and Stop food choices are explained. Plant-based and lean animal proteins are emphasized. Rationale provided for low sodium intake for blood pressure control, low added sugars for blood sugar stabilization, and low added fats and oils for coronary artery disease risk reduction  and weight management.  Calorie Density  Clinical staff conducted group or individual video education with verbal and written material and guidebook.  Patient learns about calorie density and how it impacts the Pritikin Eating Plan. Knowing the characteristics of the food you choose will help you decide whether those foods will lead to weight gain or weight loss, and whether you want to consume more or less of them. Weight loss is usually a side effect of the Pritikin Eating Plan because of its focus on low calorie-dense foods.  Label Reading  Clinical staff conducted group or individual video education with verbal and written material and guidebook.  Patient learns about the Pritikin recommended label reading guidelines and corresponding recommendations regarding calorie density, added sugars, sodium content, and whole grains.  Dining Out - Part 1  Clinical staff conducted group or individual video education with verbal and written material and guidebook.  Patient learns that restaurant meals can be sabotaging because they can be so high in calories, fat, sodium, and/or sugar. Patient learns recommended strategies on how to positively address this and avoid unhealthy pitfalls.  Facts on Fats  Clinical staff conducted group or individual video education with verbal and written material and guidebook.  Patient learns that lifestyle modifications can be just as effective, if not more so, as many medications for lowering your risk of heart disease. A Pritikin lifestyle can help to reduce your risk of inflammation and  atherosclerosis (cholesterol build-up, or plaque, in the artery walls). Lifestyle interventions such as dietary choices and physical activity address the cause of atherosclerosis. A review of the types of fats and their impact on blood cholesterol levels, along with dietary recommendations to reduce fat intake is also included.  Nutrition Action Plan  Clinical staff conducted group or individual video education with verbal and written material and guidebook.  Patient learns how to incorporate Pritikin recommendations into their lifestyle. Recommendations include planning and keeping personal health goals in mind as an important part of their success.  Healthy Mind-Set    Healthy Minds, Bodies, Hearts  Clinical staff conducted group or individual video education with verbal and written material and guidebook.  Patient learns how to identify when they are stressed. Video will discuss the impact of that stress, as well as the many benefits of stress management. Patient will also be introduced to stress management techniques. The way we think, act, and feel has an impact on our hearts.  How Our Thoughts Can Heal Our Hearts  Clinical staff conducted group or individual video education with verbal and written material and guidebook.  Patient learns that negative thoughts can cause depression and anxiety. This can result in negative lifestyle behavior and serious health problems. Cognitive behavioral therapy is an effective method to help control our thoughts in order to change and improve our emotional outlook.  Additional Videos:  Exercise    Improving Performance  Clinical staff conducted group or individual video education with verbal and written material and guidebook.  Patient learns to use a non-linear approach by alternating intensity levels and lengths of time spent exercising to help burn more calories and lose more body fat. Cardiovascular exercise helps improve heart health, metabolism,  hormonal balance, blood sugar control, and recovery from fatigue. Resistance training improves strength, endurance, balance, coordination, reaction time, metabolism, and muscle mass. Flexibility exercise improves circulation, posture, and balance. Seek guidance from your physician and exercise physiologist before implementing an exercise routine and learn your capabilities and proper form for all exercise.  Introduction to Yoga  Clinical staff conducted group or individual video education with verbal and written material and guidebook.  Patient learns about yoga, a discipline of the coming together of mind, breath, and body. The benefits of yoga include improved flexibility, improved range of motion, better posture and core strength, increased lung function, weight loss, and positive self-image. Yoga's heart health benefits include lowered blood pressure, healthier heart rate, decreased cholesterol and triglyceride levels, improved immune function, and reduced stress. Seek guidance from your physician and exercise physiologist before implementing an exercise routine and learn your capabilities and proper form for all exercise.  Medical   Aging: Enhancing Your Quality of Life  Clinical staff conducted group or individual video education with verbal and written material and guidebook.  Patient learns key strategies and recommendations to stay in good physical health and enhance quality of life, such as prevention strategies, having an advocate, securing a Health Care Proxy and Power of Attorney, and keeping a list of medications and system for tracking them. It also discusses how to avoid risk for bone loss.  Biology of Weight Control  Clinical staff conducted group or individual video education with verbal and written material and guidebook.  Patient learns that weight gain occurs because we consume more calories than we burn (eating more, moving less). Even if your body weight is normal, you may have  higher ratios of fat compared to muscle mass. Too much body fat puts you at increased risk for cardiovascular disease, heart attack, stroke, type 2 diabetes, and obesity-related cancers. In addition to exercise, following the Pritikin Eating Plan can help reduce your risk.  Decoding Lab Results  Clinical staff conducted group or individual video education with verbal and written material and guidebook.  Patient learns that lab test reflects one measurement whose values change over time and are influenced by many factors, including medication, stress, sleep, exercise, food, hydration, pre-existing medical conditions, and more. It is recommended to use the knowledge from this video to become more involved with your lab results and evaluate your numbers to speak with your doctor.   Diseases of Our Time - Overview  Clinical staff conducted group or individual video education with verbal and written material and guidebook.  Patient learns that according to the CDC, 50% to 70% of chronic diseases (such as obesity, type 2 diabetes, elevated lipids, hypertension, and heart disease) are avoidable through lifestyle improvements including healthier food choices, listening to satiety cues, and increased physical activity.  Sleep Disorders Clinical staff conducted group or individual video education with verbal and written material and guidebook.  Patient learns how good quality and duration of sleep are important to overall health and well-being. Patient also learns about sleep disorders and how they impact health along with recommendations to address them, including discussing with a physician.  Nutrition  Dining Out - Part 2 Clinical staff conducted group or individual video education with verbal and written material and guidebook.  Patient learns how to plan ahead and communicate in order to maximize their dining experience in a healthy and nutritious manner. Included are recommended food choices based on  the type of restaurant the patient is visiting.   Fueling a Banker conducted group or individual video education with verbal and written material and guidebook.  There is a strong connection between our food choices and our health. Diseases like obesity and type 2 diabetes are very prevalent and are in large-part due to lifestyle choices. The Pritikin Eating Plan  provides plenty of food and hunger-curbing satisfaction. It is easy to follow, affordable, and helps reduce health risks.  Menu Workshop  Clinical staff conducted group or individual video education with verbal and written material and guidebook.  Patient learns that restaurant meals can sabotage health goals because they are often packed with calories, fat, sodium, and sugar. Recommendations include strategies to plan ahead and to communicate with the manager, chef, or server to help order a healthier meal.  Planning Your Eating Strategy  Clinical staff conducted group or individual video education with verbal and written material and guidebook.  Patient learns about the Pritikin Eating Plan and its benefit of reducing the risk of disease. The Pritikin Eating Plan does not focus on calories. Instead, it emphasizes high-quality, nutrient-rich foods. By knowing the characteristics of the foods, we choose, we can determine their calorie density and make informed decisions.  Targeting Your Nutrition Priorities  Clinical staff conducted group or individual video education with verbal and written material and guidebook.  Patient learns that lifestyle habits have a tremendous impact on disease risk and progression. This video provides eating and physical activity recommendations based on your personal health goals, such as reducing LDL cholesterol, losing weight, preventing or controlling type 2 diabetes, and reducing high blood pressure.  Vitamins and Minerals  Clinical staff conducted group or individual video education  with verbal and written material and guidebook.  Patient learns different ways to obtain key vitamins and minerals, including through a recommended healthy diet. It is important to discuss all supplements you take with your doctor.   Healthy Mind-Set    Smoking Cessation  Clinical staff conducted group or individual video education with verbal and written material and guidebook.  Patient learns that cigarette smoking and tobacco addiction pose a serious health risk which affects millions of people. Stopping smoking will significantly reduce the risk of heart disease, lung disease, and many forms of cancer. Recommended strategies for quitting are covered, including working with your doctor to develop a successful plan.  Culinary   Becoming a Set designer conducted group or individual video education with verbal and written material and guidebook.  Patient learns that cooking at home can be healthy, cost-effective, quick, and puts them in control. Keys to cooking healthy recipes will include looking at your recipe, assessing your equipment needs, planning ahead, making it simple, choosing cost-effective seasonal ingredients, and limiting the use of added fats, salts, and sugars.  Cooking - Breakfast and Snacks  Clinical staff conducted group or individual video education with verbal and written material and guidebook.  Patient learns how important breakfast is to satiety and nutrition through the entire day. Recommendations include key foods to eat during breakfast to help stabilize blood sugar levels and to prevent overeating at meals later in the day. Planning ahead is also a key component.  Cooking - Educational psychologist conducted group or individual video education with verbal and written material and guidebook.  Patient learns eating strategies to improve overall health, including an approach to cook more at home. Recommendations include thinking of animal protein  as a side on your plate rather than center stage and focusing instead on lower calorie dense options like vegetables, fruits, whole grains, and plant-based proteins, such as beans. Making sauces in large quantities to freeze for later and leaving the skin on your vegetables are also recommended to maximize your experience.  Cooking - Healthy Salads and Dressing Clinical staff conducted group or individual  video education with verbal and written material and guidebook.  Patient learns that vegetables, fruits, whole grains, and legumes are the foundations of the Pritikin Eating Plan. Recommendations include how to incorporate each of these in flavorful and healthy salads, and how to create homemade salad dressings. Proper handling of ingredients is also covered. Cooking - Soups and [employer] - Soups and Desserts Clinical staff conducted group or individual video education with verbal and written material and guidebook.  Patient learns that Pritikin soups and desserts make for easy, nutritious, and delicious snacks and meal components that are low in sodium, fat, sugar, and calorie density, while high in vitamins, minerals, and filling fiber. Recommendations include simple and healthy ideas for soups and desserts.   Overview     The Pritikin Solution Program Overview Clinical staff conducted group or individual video education with verbal and written material and guidebook.  Patient learns that the results of the Pritikin Program have been documented in more than 100 articles published in peer-reviewed journals, and the benefits include reducing risk factors for (and, in some cases, even reversing) high cholesterol, high blood pressure, type 2 diabetes, obesity, and more! An overview of the three key pillars of the Pritikin Program will be covered: eating well, doing regular exercise, and having a healthy mind-set.  WORKSHOPS  Exercise: Exercise Basics: Building Your Action Plan Clinical staff  led group instruction and group discussion with PowerPoint presentation and patient guidebook. To enhance the learning environment the use of posters, models and videos may be added. At the conclusion of this workshop, patients will comprehend the difference between physical activity and exercise, as well as the benefits of incorporating both, into their routine. Patients will understand the FITT (Frequency, Intensity, Time, and Type) principle and how to use it to build an exercise action plan. In addition, safety concerns and other considerations for exercise and cardiac rehab will be addressed by the presenter. The purpose of this lesson is to promote a comprehensive and effective weekly exercise routine in order to improve patients' overall level of fitness.   Managing Heart Disease: Your Path to a Healthier Heart Clinical staff led group instruction and group discussion with PowerPoint presentation and patient guidebook. To enhance the learning environment the use of posters, models and videos may be added.At the conclusion of this workshop, patients will understand the anatomy and physiology of the heart. Additionally, they will understand how Pritikin's three pillars impact the risk factors, the progression, and the management of heart disease.  The purpose of this lesson is to provide a high-level overview of the heart, heart disease, and how the Pritikin lifestyle positively impacts risk factors.  Exercise Biomechanics Clinical staff led group instruction and group discussion with PowerPoint presentation and patient guidebook. To enhance the learning environment the use of posters, models and videos may be added. Patients will learn how the structural parts of their bodies function and how these functions impact their daily activities, movement, and exercise. Patients will learn how to promote a neutral spine, learn how to manage pain, and identify ways to improve their physical movement  in order to promote healthy living. The purpose of this lesson is to expose patients to common physical limitations that impact physical activity. Participants will learn practical ways to adapt and manage aches and pains, and to minimize their effect on regular exercise. Patients will learn how to maintain good posture while sitting, walking, and lifting.  Balance Training and Fall Prevention  Clinical staff led group  instruction and group discussion with PowerPoint presentation and patient guidebook. To enhance the learning environment the use of posters, models and videos may be added. At the conclusion of this workshop, patients will understand the importance of their sensorimotor skills (vision, proprioception, and the vestibular system) in maintaining their ability to balance as they age. Patients will apply a variety of balancing exercises that are appropriate for their current level of function. Patients will understand the common causes for poor balance, possible solutions to these problems, and ways to modify their physical environment in order to minimize their fall risk. The purpose of this lesson is to teach patients about the importance of maintaining balance as they age and ways to minimize their risk of falling.  WORKSHOPS   Nutrition:  Fueling a Ship broker led group instruction and group discussion with PowerPoint presentation and patient guidebook. To enhance the learning environment the use of posters, models and videos may be added. Patients will review the foundational principles of the Pritikin Eating Plan and understand what constitutes a serving size in each of the food groups. Patients will also learn Pritikin-friendly foods that are better choices when away from home and review make-ahead meal and snack options. Calorie density will be reviewed and applied to three nutrition priorities: weight maintenance, weight loss, and weight gain. The purpose of this  lesson is to reinforce (in a group setting) the key concepts around what patients are recommended to eat and how to apply these guidelines when away from home by planning and selecting Pritikin-friendly options. Patients will understand how calorie density may be adjusted for different weight management goals.  Mindful Eating  Clinical staff led group instruction and group discussion with PowerPoint presentation and patient guidebook. To enhance the learning environment the use of posters, models and videos may be added. Patients will briefly review the concepts of the Pritikin Eating Plan and the importance of low-calorie dense foods. The concept of mindful eating will be introduced as well as the importance of paying attention to internal hunger signals. Triggers for non-hunger eating and techniques for dealing with triggers will be explored. The purpose of this lesson is to provide patients with the opportunity to review the basic principles of the Pritikin Eating Plan, discuss the value of eating mindfully and how to measure internal cues of hunger and fullness using the Hunger Scale. Patients will also discuss reasons for non-hunger eating and learn strategies to use for controlling emotional eating.  Targeting Your Nutrition Priorities Clinical staff led group instruction and group discussion with PowerPoint presentation and patient guidebook. To enhance the learning environment the use of posters, models and videos may be added. Patients will learn how to determine their genetic susceptibility to disease by reviewing their family history. Patients will gain insight into the importance of diet as part of an overall healthy lifestyle in mitigating the impact of genetics and other environmental insults. The purpose of this lesson is to provide patients with the opportunity to assess their personal nutrition priorities by looking at their family history, their own health history and current risk factors.  Patients will also be able to discuss ways of prioritizing and modifying the Pritikin Eating Plan for their highest risk areas  Menu  Clinical staff led group instruction and group discussion with PowerPoint presentation and patient guidebook. To enhance the learning environment the use of posters, models and videos may be added. Using menus brought in from [employer], or printed from [employer], patients will  apply the Pritikin dining out guidelines that were presented in the [employer] video. Patients will also be able to practice these guidelines in a variety of provided scenarios. The purpose of this lesson is to provide patients with the opportunity to practice hands-on learning of the Pritikin Dining Out guidelines with actual menus and practice scenarios.  Label Reading Clinical staff led group instruction and group discussion with PowerPoint presentation and patient guidebook. To enhance the learning environment the use of posters, models and videos may be added. Patients will review and discuss the Pritikin label reading guidelines presented in Pritikin's Label Reading Educational series video. Using fool labels brought in from local grocery stores and markets, patients will apply the label reading guidelines and determine if the packaged food meet the Pritikin guidelines. The purpose of this lesson is to provide patients with the opportunity to review, discuss, and practice hands-on learning of the Pritikin Label Reading guidelines with actual packaged food labels. Cooking School  Pritikin's [employer] are designed to teach patients ways to prepare quick, simple, and affordable recipes at home. The importance of nutrition's role in chronic disease risk reduction is reflected in its emphasis in the overall Pritikin program. By learning how to prepare essential core Pritikin Eating Plan recipes, patients will increase control over what they eat; be able to  customize the flavor of foods without the use of added salt, sugar, or fat; and improve the quality of the food they consume. By learning a set of core recipes which are easily assembled, quickly prepared, and affordable, patients are more likely to prepare more healthy foods at home. These workshops focus on convenient breakfasts, simple entres, side dishes, and desserts which can be prepared with minimal effort and are consistent with nutrition recommendations for cardiovascular risk reduction. Cooking [employer] are taught by a Armed forces logistics/support/administrative officer (RD) who has been trained by the [employer]. The chef or RD has a clear understanding of the importance of minimizing - if not completely eliminating - added fat, sugar, and sodium in recipes. Throughout the series of Cooking School Workshop sessions, patients will learn about healthy ingredients and efficient methods of cooking to build confidence in their capability to prepare    Cooking School weekly topics:  Adding Flavor- Sodium-Free  Fast and Healthy Breakfasts  Powerhouse Plant-Based Proteins  Satisfying Salads and Dressings  Simple Sides and Sauces  International Cuisine-Spotlight on the [employer] Zones  Delicious Desserts  Savory Soups  [employer] - Meals in a Astronomer Appetizers and Snacks  Comforting Weekend Breakfasts  One-Pot Wonders   Fast Evening Meals  Landscape architect Your Pritikin Plate  WORKSHOPS   Healthy Mindset (Psychosocial):  Focused Goals, Sustainable Changes Clinical staff led group instruction and group discussion with PowerPoint presentation and patient guidebook. To enhance the learning environment the use of posters, models and videos may be added. Patients will be able to apply effective goal setting strategies to establish at least one personal goal, and then take consistent, meaningful action toward that goal. They will learn to identify common barriers to  achieving personal goals and develop strategies to overcome them. Patients will also gain an understanding of how our mind-set can impact our ability to achieve goals and the importance of cultivating a positive and growth-oriented mind-set. The purpose of this lesson is to provide patients with a deeper understanding of how to set and achieve personal goals, as well as the tools and  strategies needed to overcome common obstacles which may arise along the way.  From Head to Heart: The Power of a Healthy Outlook  Clinical staff led group instruction and group discussion with PowerPoint presentation and patient guidebook. To enhance the learning environment the use of posters, models and videos may be added. Patients will be able to recognize and describe the impact of emotions and mood on physical health. They will discover the importance of self-care and explore self-care practices which may work for them. Patients will also learn how to utilize the 4 C's to cultivate a healthier outlook and better manage stress and challenges. The purpose of this lesson is to demonstrate to patients how a healthy outlook is an essential part of maintaining good health, especially as they continue their cardiac rehab journey.  Healthy Sleep for a Healthy Heart Clinical staff led group instruction and group discussion with PowerPoint presentation and patient guidebook. To enhance the learning environment the use of posters, models and videos may be added. At the conclusion of this workshop, patients will be able to demonstrate knowledge of the importance of sleep to overall health, well-being, and quality of life. They will understand the symptoms of, and treatments for, common sleep disorders. Patients will also be able to identify daytime and nighttime behaviors which impact sleep, and they will be able to apply these tools to help manage sleep-related challenges. The purpose of this lesson is to provide patients with a  general overview of sleep and outline the importance of quality sleep. Patients will learn about a few of the most common sleep disorders. Patients will also be introduced to the concept of "sleep hygiene," and discover ways to self-manage certain sleeping problems through simple daily behavior changes. Finally, the workshop will motivate patients by clarifying the links between quality sleep and their goals of heart-healthy living.   Recognizing and Reducing Stress Clinical staff led group instruction and group discussion with PowerPoint presentation and patient guidebook. To enhance the learning environment the use of posters, models and videos may be added. At the conclusion of this workshop, patients will be able to understand the types of stress reactions, differentiate between acute and chronic stress, and recognize the impact that chronic stress has on their health. They will also be able to apply different coping mechanisms, such as reframing negative self-talk. Patients will have the opportunity to practice a variety of stress management techniques, such as deep abdominal breathing, progressive muscle relaxation, and/or guided imagery.  The purpose of this lesson is to educate patients on the role of stress in their lives and to provide healthy techniques for coping with it.  Learning Barriers/Preferences:  Learning Barriers/Preferences - 09/03/23 0857       Learning Barriers/Preferences   Learning Barriers Sight   wears reading glasses   Learning Preferences Individual Instruction;Group Instruction;Skilled Demonstration;Verbal Instruction          Education Topics:  Knowledge Questionnaire Score:  Knowledge Questionnaire Score - 09/03/23 1232       Knowledge Questionnaire Score   Pre Score 22/24          Core Components/Risk Factors/Patient Goals at Admission:  Personal Goals and Risk Factors at Admission - 09/03/23 0858       Core Components/Risk Factors/Patient Goals on  Admission    Weight Management Weight Gain;Yes    Intervention Weight Management/Obesity: Establish reasonable short term and long term weight goals.;Weight Management: Provide education and appropriate resources to help participant work on and attain dietary goals.;Weight  Management: Develop a combined nutrition and exercise program designed to reach desired caloric intake, while maintaining appropriate intake of nutrient and fiber, sodium and fats, and appropriate energy expenditure required for the weight goal.    Admit Weight 161 lb 12.8 oz (73.4 kg)    Goal Weight: Long Term 165 lb (74.8 kg)    Expected Outcomes Short Term: Continue to assess and modify interventions until short term weight is achieved;Long Term: Adherence to nutrition and physical activity/exercise program aimed toward attainment of established weight goal;Weight Gain: Understanding of general recommendations for a high calorie, high protein meal plan that promotes weight gain by distributing calorie intake throughout the day with the consumption for 4-5 meals, snacks, and/or supplements;Understanding of distribution of calorie intake throughout the day with the consumption of 4-5 meals/snacks;Understanding recommendations for meals to include 15-35% energy as protein, 25-35% energy from fat, 35-60% energy from carbohydrates, less than 200mg  of dietary cholesterol, 20-35 gm of total fiber daily;Weight Maintenance: Understanding of the daily nutrition guidelines, which includes 25-35% calories from fat, 7% or less cal from saturated fats, less than 200mg  cholesterol, less than 1.5gm of sodium, & 5 or more servings of fruits and vegetables daily    Improve shortness of breath with ADL's Yes    Intervention Provide education, individualized exercise plan and daily activity instruction to help decrease symptoms of SOB with activities of daily living.    Expected Outcomes Short Term: Improve cardiorespiratory fitness to achieve a reduction  of symptoms when performing ADLs;Long Term: Be able to perform more ADLs without symptoms or delay the onset of symptoms    Heart Failure Yes    Intervention Provide a combined exercise and nutrition program that is supplemented with education, support and counseling about heart failure. Directed toward relieving symptoms such as shortness of breath, decreased exercise tolerance, and extremity edema.    Expected Outcomes Improve functional capacity of life;Short term: Attendance in program 2-3 days a week with increased exercise capacity. Reported lower sodium intake. Reported increased fruit and vegetable intake. Reports medication compliance.;Short term: Daily weights obtained and reported for increase. Utilizing diuretic protocols set by physician.;Long term: Adoption of self-care skills and reduction of barriers for early signs and symptoms recognition and intervention leading to self-care maintenance.    Hypertension Yes    Intervention Monitor prescription use compliance.;Provide education on lifestyle modifcations including regular physical activity/exercise, weight management, moderate sodium restriction and increased consumption of fresh fruit, vegetables, and low fat dairy, alcohol moderation, and smoking cessation.    Expected Outcomes Short Term: Continued assessment and intervention until BP is < 140/24mm HG in hypertensive participants. < 130/6mm HG in hypertensive participants with diabetes, heart failure or chronic kidney disease.;Long Term: Maintenance of blood pressure at goal levels.    Lipids Yes    Intervention Provide education and support for participant on nutrition & aerobic/resistive exercise along with prescribed medications to achieve LDL 70mg , HDL >40mg .    Expected Outcomes Short Term: Participant states understanding of desired cholesterol values and is compliant with medications prescribed. Participant is following exercise prescription and nutrition guidelines.;Long Term:  Cholesterol controlled with medications as prescribed, with individualized exercise RX and with personalized nutrition plan. Value goals: LDL < 70mg , HDL > 40 mg.          Core Components/Risk Factors/Patient Goals Review:    Core Components/Risk Factors/Patient Goals at Discharge (Final Review):    ITP Comments:  ITP Comments     Row Name 09/03/23 [phone]  ITP Comments Wilbert Bihari, MD:  Medical Director.  Introduction to the [facility] / Intensive Cardiac Rehab.  Initial orientation packet reviewed with the patient.          Comments: Pt completed orientation on 09/03/23. Pt was introduced to [facility] via folder and video.  Pt underwent , pt maintained SR while on telemetry with rare PVCs. Pt completed test w/o unusual signs and symptoms. All questions and concerns were answered prior to pt leaving. Pt will come back for his first day on 09/11/23.  0810-1000

## 2023-09-03 NOTE — Telephone Encounter (Signed)
 Called patient regarding his appt this am at 8:00. Pt has not arrived. L/M on V/M for return call to our office at [phone].  Alm Parkins MS, ACSM-CEP, CCRP

## 2023-09-05 ENCOUNTER — Ambulatory Visit

## 2023-09-05 DIAGNOSIS — M6281 Muscle weakness (generalized): Secondary | ICD-10-CM | POA: Diagnosis not present

## 2023-09-05 DIAGNOSIS — M79601 Pain in right arm: Secondary | ICD-10-CM

## 2023-09-05 NOTE — Therapy (Signed)
 OUTPATIENT PHYSICAL THERAPY NEURO TREATMENT and D/C Summary   Patient Name: Reginald [name] MRN: [id number] DOB:[birth year], 58 y.o., male Today's Date: 09/05/2023   PCP: Anita Bernardino BROCKS, FNP REFERRING PROVIDER: Kerrin Elspeth BROCKS, MD  PHYSICAL THERAPY DISCHARGE SUMMARY  Visits from Start of Care: 4  Current functional level related to goals / functional outcomes: Improved grip strength and isolated strength to manual muscle test   Remaining deficits: Limited right grip endurance, notable blanching to right hand radial distribution   Education / Equipment: HEP   Patient agrees to discharge. Patient goals were met. Patient is being discharged due to meeting the stated rehab goals.   END OF SESSION:  PT End of Session - 09/05/23 0850     Visit Number 4    Number of Visits 5    Date for PT Re-Evaluation 09/12/23    Authorization Type [employer]    PT Start Time [phone]    PT Stop Time 0930    PT Time Calculation (min) 40 min    Activity Tolerance Patient tolerated treatment well    Behavior During Therapy WFL for tasks assessed/performed           Past Medical History:  Diagnosis Date   Arthritis    GERD (gastroesophageal reflux disease)    Headache    migraines    IBS (irritable bowel syndrome)    Past Surgical History:  Procedure Laterality Date   BIOPSY  11/17/2021   Procedure: BIOPSY;  Surgeon: Eartha Angelia Sieving, MD;  [facility]: [facility];  Service: Gastroenterology;;   COLONOSCOPY WITH PROPOFOL  N/A 11/17/2021   Procedure: COLONOSCOPY WITH PROPOFOL ;  Surgeon: Eartha Angelia Sieving, MD;  Location: [facility];  Service: Gastroenterology;  Laterality: N/A;  200 ASA 2   CORONARY ARTERY BYPASS GRAFT N/A 05/31/2023   Procedure: CORONARY ARTERY BYPASS GRAFTING (CABG) TIMES THREE USING LEFT INTERNAL MAMMARY ARTERY AND OPENLY HARVESTED RIGHT AND LEFT GREATER SAPHENOUS VEIN;  Surgeon: Kerrin Elspeth BROCKS, MD;  Location: [facility] OR;  Service: Open Heart Surgery;   Laterality: N/A;   ESOPHAGOGASTRODUODENOSCOPY N/A 04/24/2023   Procedure: EGD (ESOPHAGOGASTRODUODENOSCOPY);  Surgeon: Eartha Angelia, Sieving, MD;  Location: [facility];  Service: Gastroenterology;  Laterality: N/A;  9:15AM;ASA 2   ESOPHAGOGASTRODUODENOSCOPY (EGD) WITH PROPOFOL  N/A 11/17/2021   Procedure: ESOPHAGOGASTRODUODENOSCOPY (EGD) WITH PROPOFOL ;  Surgeon: Eartha Angelia Sieving, MD;  Location: [facility];  Service: Gastroenterology;  Laterality: N/A;   INGUINAL HERNIA REPAIR Right 06/02/2015   Procedure: RIGHT INGUINAL HERNIA REPAIR WITH MESH;  Surgeon: Camellia Blush, MD;  Location: [facility] OR;  Service: General;  Laterality: Right;   INSERTION OF MESH Right 06/02/2015   Procedure: INSERTION OF MESH;  Surgeon: Camellia Blush, MD;  Location: [facility] OR;  Service: General;  Laterality: Right;   INTRAOPERATIVE TRANSESOPHAGEAL ECHOCARDIOGRAM N/A 05/31/2023   Procedure: ECHOCARDIOGRAM, TRANSESOPHAGEAL, INTRAOPERATIVE;  Surgeon: Kerrin Elspeth BROCKS, MD;  Location: [facility] OR;  Service: Open Heart Surgery;  Laterality: N/A;   LEFT HEART CATH AND CORONARY ANGIOGRAPHY N/A 05/29/2023   Procedure: LEFT HEART CATH AND CORONARY ANGIOGRAPHY;  Surgeon: [place], Peter M, MD;  Location: [facility] INVASIVE CV LAB;  Service: Cardiovascular;  Laterality: N/A;   NO PAST SURGERIES     POLYPECTOMY  11/17/2021   Procedure: POLYPECTOMY;  Surgeon: Eartha Angelia Sieving, MD;  Location: [facility];  Service: Gastroenterology;;   Patient Active Problem List   Diagnosis Date Noted   S/P CABG (coronary artery bypass graft) 05/31/2023   Unstable angina (HCC) 05/28/2023  Pulmonary nodule 05/28/2023   Essential hypertension 05/28/2023   IBS (irritable bowel syndrome) 05/28/2023   Melena 04/01/2023   Abdominal pain, epigastric 04/01/2023   Gastroesophageal reflux disease 01/01/2023   Helicobacter pylori gastritis 12/07/2021   Nausea and vomiting 09/04/2021   Chronic diarrhea 09/04/2021    ONSET DATE: May 2025  REFERRING  DIAG:  R29.898 (ICD-10-CM) - Weakness of right arm    THERAPY DIAG:  Muscle weakness (generalized)  Pain in right arm  Rationale for Evaluation and Treatment: Rehabilitation  SUBJECTIVE:                                                                                                                                                                                             SUBJECTIVE STATEMENT: Still feeling about the same    Pt accompanied by: significant other, kids  PERTINENT HISTORY: seen by the cardiology NP on 07/05/2023. No changes were made to his medications. We reviewed driving, participation in cardiac rehab, and sternal precautions (no lifting more than 10 pounds until after 07/31/2023.) He will continued to be followed by cardiology indefinitely. He will need a CT and follow up for the irregular nodule of the right pulmonary apex measuring 1.0 x 0.5 cm. PET CT and follow up will be arranged with Dr. Kerrin. Regarding right forearm pain/weakness, referral to PT.  PAIN:  Are you having pain? Yes: NPRS scale: 5-6/10 Pain location: anterior right forearm Pain description: throb Aggravating factors: using RUE Relieving factors: shaking, elevating overhead  PRECAUTIONS: Sternal  RED FLAGS: None   WEIGHT BEARING RESTRICTIONS: No  FALLS: Has patient fallen in last 6 months? No  LIVING ENVIRONMENT: Lives with: lives with their family Lives in: House/apartment Stairs:   Has following equipment at home: None  PLOF: Independent  PATIENT GOALS:   OBJECTIVE:   TODAY'S TREATMENT: 09/05/23 Activity Comments  Right grip strength: 60# Right pinch: 20#   Manual muscle test 5/5 wrist extension/flexion 4/5 finger abduction/adduction 4+/5 right thumb adduction  HEP review   Discussion of grip endurance vs strength and implications in his job duties Verbalizes understanding           TODAY'S TREATMENT: 08/27/23 Activity Comments  Manual muscle test: R 4/5 wrist  flexion/extension  Right grip strength 60 lbs  Clothespins all colors   Gripper block transfer 2x10 Black [place], lowest setting  Suitcase carry x 2 min 15# KB; fatigue at 1 min, 30 sec, 30 sec--notes increased feeling of tightness in forearm (feels like a vise)  Appreciate diminished radial pulse on right wrist vs left   MHP to right hand/wrist x 5 min  Soft tissue mobilization right hand/wrist, static stretching, assisted nerve glide median/ulnar      TODAY'S TREATMENT: 08/14/23 Activity Comments  review of HEP: wrist flexor stretch on table 5x10 pronation/supination with weight 4# finger lumbricals w/ putty key pinch w/ putty With wrist flexro stretch- pt's R dorsal hand became pale as if reduced circiulation however denies pain. Adjusted reps to 5x5 instead.   Pt restricted in pronation ROM and limited control of 4#. Cues to move within tolerable ROM  Verbal review of putty exercises   R median nerve glide 2x5  Cueing for form. Restricted elbow and wrist   R radial nerve glide 2x5  Cueing for form. Some restriction in wrist flexion   STM and manual TPR R forearm Very tight and tender in wrist flexors/extensors and supinator. Improved restriction and pain after MT           HOME EXERCISE PROGRAM Last updated: 08/15/23 Access Code: [email or web address] URL: [email or web address] Date: 08/15/2023 Prepared by: [facility] - Outpatient  Rehab - [facility]  Exercises - Wrist Flexor Stretch on Table  - 1 x daily - 7 x weekly - 3 sets - 5 reps - 5 sec hold - Forearm Pronation and Supination with Hammer  - 1 x daily - 7 x weekly - 3 sets - 10 reps - Finger Lumbricals with Putty  - 1 x daily - 7 x weekly - 3 sets - 10 reps - Key Pinch with Putty  - 1 x daily - 7 x weekly - 3 sets - 10 reps - Radial Nerve Flossing  - 1 x daily - 5 x weekly - 2 sets - 10 reps - Median Nerve Flossing - Tray  - 1 x daily - 5 x weekly - 2 sets - 10 reps   PATIENT EDUCATION: Education  details: adjustment to HEP and encouraged pt to monitor R hand pallor  Person educated: Patient and Spouse Education method: Explanation, Demonstration, Tactile cues, Verbal cues, and Handouts Education comprehension: verbalized understanding and returned demonstration     Note: Objective measures were completed at Evaluation unless otherwise noted.  DIAGNOSTIC FINDINGS:   COGNITION: Overall cognitive status: Within functional limits for tasks assessed   SENSATION: Numbness/tingling right anterior forearm radial are, extending to thumb and 2-3 fingers  Grip strength: left grip 80#; right grip: 33# average (10/10 pain with grip) Pinch strength: left pinch 20#, right pinch 10# average     DTRs:  Biceps 2+ = Normal and Brachioradialis 0 = Absent  POSTURE: No Significant postural limitations  Right wrist ROM/strength:  wrist flexion 30 deg, wrist ext 20 deg Right Wrist extension strength 3/5  Right wrist flexion: 4/5 Finger abduction: 3-/5 Thumb adduction: DIP flexion with paper pull Appreciate atrophy to dorsal interossei  Pronation 3/5  TREATMENT DATE: 08/08/23    PATIENT EDUCATION: Education details: assessment findings, HEP initiation Person educated: Patient and Spouse Education method: Explanation Education comprehension: verbalized understanding  HOME EXERCISE PROGRAM: Access Code: [email or web address] URL: [email or web address] Date: 08/08/2023 Prepared by: Kelly Enrica Corliss  Exercises - Wrist Flexor Stretch on Table  - 1 x daily - 7 x weekly - 3 sets - 10 reps - Forearm Pronation and Supination with Hammer  - 1 x daily - 7 x weekly - 3 sets - 10 reps - Finger Lumbricals with Putty  - 1 x daily - 7 x weekly - 3 sets - 10 reps - Key Pinch with Putty  - 1 x daily - 7 x weekly - 3 sets - 10 reps  GOALS: Goals reviewed with patient?  Yes  SHORT TERM GOALS: Target date: same as LTG    LONG TERM GOALS: Target date: 08/08/23  Patient will be independent in HEP to improve functional outcomes Baseline:  Goal status: MET  2.  Improve grip strength to 45# right hand to improve grasp and ability to hold cups Baseline: 33#; 60# Goal status: MET  3.  Increase wrist extension strength 4/5 for improved grip Baseline: 3/5; 5/5 Goal status: MET  4.  Pain in right forearm 2/10 with gripping tasks to improve activity tolerance Baseline: 7/10; 0/10 Goal status: MET    ASSESSMENT:  CLINICAL IMPRESSION: Increased grip strength evident right hand to 60# on dynamometer and forearm flexors/extensors 5/5.  Notes limited grip endurance with increase in fatigue and sensation of pressure.  Appreciate weak radial pulse on involved right with very easily palpated radial on LUE.  Capillary refill seems to be less brisk on radial a. Distribution right hand. Hand intrinsics with improved strength. Notable pallor to right hand with sustained grip and reports limited endurance for maintaining grip. Pt reports he is following up with vascular MD for assessment. D/C to HEP at this time  OBJECTIVE IMPAIRMENTS: decreased ROM, decreased strength, impaired flexibility, impaired UE functional use, and pain.   ACTIVITY LIMITATIONS: carrying, lifting, dressing, and reach over head  PARTICIPATION LIMITATIONS: meal prep, cleaning, community activity, occupation, and yard work  PERSONAL FACTORS: Age and Time since onset of injury/illness/exacerbation are also affecting patient's functional outcome.   REHAB POTENTIAL: Good  CLINICAL DECISION MAKING: Evolving/moderate complexity  EVALUATION COMPLEXITY: Moderate  PLAN:  PT FREQUENCY: 1x/week  PT DURATION: 4 weeks  PLANNED INTERVENTIONS: 97750- Physical Performance Testing, 97110-Therapeutic exercises, 97530- Therapeutic activity, [id number]- Neuromuscular re-education, 97535- Self Care, 02859-  Manual therapy, [id number]- Gait training, [id number]- Orthotic Initial, [id number]- Splinting, [id number]- Electrical stimulation (manual), and 20560 (1-2 muscles), 20561 (3+ muscles)- Dry Needling  PLAN FOR NEXT SESSION: D/C     8:50 AM, 09/05/23 M. Kelly Riniyah Speich, PT, DPT Physical Therapist- [place] Office Number: [phone]

## 2023-09-11 ENCOUNTER — Encounter (HOSPITAL_COMMUNITY)
Admission: RE | Admit: 2023-09-11 | Discharge: 2023-09-11 | Disposition: A | Discharge status: Home / Self Care | Source: Ambulatory Visit | Attending: Internal Medicine

## 2023-09-11 DIAGNOSIS — Z951 Presence of aortocoronary bypass graft: Secondary | ICD-10-CM | POA: Diagnosis not present

## 2023-09-11 LAB — GLUCOSE, CAPILLARY: Glucose-Capillary: 119 mg/dL — ABNORMAL HIGH (ref 70–99)

## 2023-09-11 NOTE — Progress Notes (Signed)
 Daily Session Note  Patient Details  Name: Reginald [name] MRN: [id number] Date of Birth: September 24, 1965 Referring Provider:   Flowsheet Row INTENSIVE CARDIAC REHAB ORIENT from 09/03/2023 in [facility]  Referring Provider Dr. Diannah Fender    Encounter Date: 09/11/2023  Check In:  Session Check In - 09/11/23 0945       Check-In   Supervising physician immediately available to respond to emergencies [facility] MD immediately available    Physician(s) Lum Louis, NP    Location [facility]    Staff Present Con Pereyra, MS, Exercise Physiologist;Jetta Vannie BS, ACSM-CEP, Exercise Physiologist;Maria Whitaker, RN, Valere Music, RN, Avonne Gal, MS, ACSM-CEP, Exercise Physiologist;David Makemson, MS, ACSM-CEP, CCRP, Exercise Physiologist    Virtual Visit No    Medication changes reported     No    Fall or balance concerns reported    No    Tobacco Cessation No Change    Warm-up and Cool-down Performed as group-led instruction    Resistance Training Performed No    VAD Patient? No    PAD/SET Patient? No      Pain Assessment   Currently in Pain? No/denies    Multiple Pain Sites No          Capillary Blood Glucose: Results for orders placed or performed during the hospital encounter of 09/11/23 (from the past 24 hours)  Glucose, capillary     Status: Abnormal   Collection Time: 09/11/23 10:09 AM  Result Value Ref Range   Glucose-Capillary 119 (H) 70 - 99 mg/dL     Exercise Prescription Changes - 09/11/23 1100       Response to Exercise   Blood Pressure (Admit) 128/78    Blood Pressure (Exercise) 178/82    Blood Pressure (Exit) 130/84    Heart Rate (Admit) 84 bpm    Heart Rate (Exercise) 113 bpm    Heart Rate (Exit) 96 bpm    Rating of Perceived Exertion (Exercise) 13    Symptoms None    Comments Pt's first day in the CRP2 program    Duration Continue with 30 min of aerobic exercise  without signs/symptoms of physical distress.    Intensity THRR unchanged      Progression   Progression Continue to progress workloads to maintain intensity without signs/symptoms of physical distress.    Average METs 2.6      Resistance Training   Training Prescription No    Weight No weights on Wednesdays    Reps 10-15    Time 5 Minutes      Interval Training   Interval Training No      Treadmill   MPH 1.8    Grade 0    Minutes 15    METs 2.4      Recumbant Elliptical   Level 2    RPM 34    Watts 50    Minutes 15    METs 2.8          Social History   Tobacco Use  Smoking Status Former   Types: Cigarettes   Passive exposure: Current  Smokeless Tobacco Former    Goals Met:  Exercise tolerated well No report of concerns or symptoms today  Goals Unmet:  Not Applicable  Comments: Pt started cardiac rehab today.  Pt tolerated light exercise without difficulty. VSS, telemetry-NSR with some ST depression noted, asymptomatic.  Medication list reconciled. Pt denies barriers to medication compliance.  PSYCHOSOCIAL  ASSESSMENT:  PHQ-0. Pt exhibits positive coping skills, hopeful outlook with supportive family. No psychosocial needs identified at this time, no psychosocial interventions necessary.    Pt enjoys mechanics, fishing, hands-on DIY, hopes to increase his strength/stamina and energy over the course of cardiac rehab.   Pt oriented to exercise equipment and routine.    Understanding verbalized.     Dr. Wilbert Bihari is Medical Director for Cardiac Rehab at [facility].

## 2023-09-12 ENCOUNTER — Other Ambulatory Visit: Payer: Self-pay | Admitting: Vascular Surgery

## 2023-09-12 DIAGNOSIS — M79601 Pain in right arm: Secondary | ICD-10-CM

## 2023-09-13 ENCOUNTER — Encounter (HOSPITAL_COMMUNITY)
Admission: RE | Admit: 2023-09-13 | Discharge: 2023-09-13 | Disposition: A | Discharge status: Home / Self Care | Source: Ambulatory Visit | Attending: Internal Medicine | Admitting: Internal Medicine

## 2023-09-13 DIAGNOSIS — Z951 Presence of aortocoronary bypass graft: Secondary | ICD-10-CM | POA: Diagnosis not present

## 2023-09-16 ENCOUNTER — Encounter (HOSPITAL_COMMUNITY): Admission: RE | Admit: 2023-09-16 | Source: Ambulatory Visit

## 2023-09-18 ENCOUNTER — Encounter (HOSPITAL_COMMUNITY)
Admission: RE | Admit: 2023-09-18 | Discharge: 2023-09-18 | Disposition: A | Discharge status: Home / Self Care | Source: Ambulatory Visit | Attending: Internal Medicine | Admitting: Internal Medicine

## 2023-09-18 DIAGNOSIS — Z951 Presence of aortocoronary bypass graft: Secondary | ICD-10-CM

## 2023-09-20 ENCOUNTER — Encounter (HOSPITAL_COMMUNITY)
Admission: RE | Admit: 2023-09-20 | Discharge: 2023-09-20 | Disposition: A | Discharge status: Home / Self Care | Source: Ambulatory Visit | Attending: Internal Medicine | Admitting: Internal Medicine

## 2023-09-20 DIAGNOSIS — Z951 Presence of aortocoronary bypass graft: Secondary | ICD-10-CM

## 2023-09-24 ENCOUNTER — Other Ambulatory Visit (INDEPENDENT_AMBULATORY_CARE_PROVIDER_SITE_OTHER): Payer: Self-pay | Admitting: Gastroenterology

## 2023-09-25 ENCOUNTER — Encounter (HOSPITAL_COMMUNITY)
Admission: RE | Admit: 2023-09-25 | Discharge: 2023-09-25 | Disposition: A | Discharge status: Home / Self Care | Source: Ambulatory Visit | Attending: Internal Medicine | Admitting: Internal Medicine

## 2023-09-25 DIAGNOSIS — Z7902 Long term (current) use of antithrombotics/antiplatelets: Secondary | ICD-10-CM | POA: Diagnosis not present

## 2023-09-25 DIAGNOSIS — Z951 Presence of aortocoronary bypass graft: Secondary | ICD-10-CM | POA: Insufficient documentation

## 2023-09-25 DIAGNOSIS — Z48812 Encounter for surgical aftercare following surgery on the circulatory system: Secondary | ICD-10-CM | POA: Insufficient documentation

## 2023-09-26 ENCOUNTER — Encounter: Payer: Self-pay | Admitting: Emergency Medicine

## 2023-09-26 NOTE — Progress Notes (Signed)
 Cardiac Individual Treatment Plan  Patient Details  Name: Reginald [name] MRN: [id number] Date of Birth: 1965-07-03 Referring Provider:   Flowsheet Row INTENSIVE CARDIAC REHAB ORIENT from 09/03/2023 in [facility]  Referring Provider Dr. Diannah Fender    Initial Encounter Date:  Flowsheet Row INTENSIVE CARDIAC REHAB ORIENT from 09/03/2023 in [facility] for Heart, Vascular, & Lung Health  Date 09/03/23    Visit Diagnosis: 05/31/23 S/P CABG x 3  Patient's Home Medications on Admission:  Current Outpatient Medications:    aspirin  EC 81 MG tablet, Take 1 tablet (81 mg total) by mouth daily. Swallow whole., Disp: , Rfl:    clopidogrel  (PLAVIX ) 75 MG tablet, Take 1 tablet (75 mg total) by mouth daily., Disp: 30 tablet, Rfl: 1   dicyclomine  (BENTYL ) 10 MG capsule, TAKE 1 CAPSULE (10 MG TOTAL) BY MOUTH EVERY 12 (TWELVE) HOURS AS NEEDED (ABDOMINAL PAIN)., Disp: 180 capsule, Rfl: 1   diphenoxylate -atropine  (LOMOTIL ) 2.5-0.025 MG tablet, TAKE 1 TABLET BY MOUTH 4 (FOUR) TIMES DAILY AS NEEDED FOR DIARRHEA OR LOOSE STOOLS., Disp: 180 tablet, Rfl: 1   empagliflozin  (JARDIANCE ) 10 MG TABS tablet, Take 1 tablet (10 mg total) by mouth daily., Disp: 30 tablet, Rfl: 1   furosemide  (LASIX ) 40 MG tablet, Take 1 tablet (40 mg total) by mouth daily., Disp: 30 tablet, Rfl: 3   gabapentin  (NEURONTIN ) 300 MG capsule, Take 1 capsule (300 mg total) by mouth 2 (two) times daily., Disp: 60 capsule, Rfl: 0   losartan  (COZAAR ) 25 MG tablet, Take 1 tablet (25 mg total) by mouth daily., Disp: 30 tablet, Rfl: 1   MAGNESIUM  PO, Take 1 tablet by mouth at bedtime., Disp: , Rfl:    metoprolol  succinate (TOPROL -XL) 50 MG 24 hr tablet, Take 1 tablet (50 mg total) by mouth at bedtime. Take with or immediately following a meal., Disp: 30 tablet, Rfl: 1   Multiple Vitamin (MULTIVITAMIN) tablet, Take 1 tablet by mouth at bedtime., Disp: , Rfl:     nicotine  (NICODERM CQ  - DOSED IN MG/24 HOURS) 21 mg/24hr patch, Place 1 patch (21 mg total) onto the skin daily., Disp: 28 patch, Rfl: 0   nicotine  polacrilex (NICORETTE ) 2 MG gum, Take 1 each (2 mg total) by mouth as needed for smoking cessation. (Patient not taking: Reported on 09/03/2023), Disp: 100 tablet, Rfl: 0   ondansetron  (ZOFRAN ) 4 MG tablet, TAKE 1 TABLET BY MOUTH EVERY 8 HOURS AS NEEDED FOR NAUSEA AND VOMITING, Disp: 90 tablet, Rfl: 3   oxyCODONE  (OXY IR/ROXICODONE ) 5 MG immediate release tablet, Take 1 tablet (5 mg total) by mouth every 6 (six) hours as needed for severe pain (pain score 7-10)., Disp: 56 tablet, Rfl: 0   potassium chloride  SA (KLOR-CON  M) 20 MEQ tablet, Take 1 tablet (20 mEq total) by mouth daily., Disp: 90 tablet, Rfl: 1   promethazine  (PHENERGAN ) 12.5 MG tablet, TAKE 1 TABLET BY MOUTH EVERY 6 HOURS AS NEEDED FOR NAUSEA OR VOMITING., Disp: 30 tablet, Rfl: 0   rosuvastatin  (CRESTOR ) 20 MG tablet, Take 1 tablet (20 mg total) by mouth daily., Disp: 90 tablet, Rfl: 3   spironolactone  (ALDACTONE ) 25 MG tablet, Take 1 tablet (25 mg total) by mouth daily., Disp: 30 tablet, Rfl: 1   sucralfate  (CARAFATE ) 1 g tablet, Take 1 tablet (1 g total) by mouth 4 (four) times daily -  with meals and at bedtime. Dissolve tablet in 30ml of water, then drink slurry, Disp:  120 tablet, Rfl: 1   sulfamethoxazole -trimethoprim  (BACTRIM  DS) 800-160 MG tablet, Take 1 tablet by mouth 2 (two) times daily., Disp: 14 tablet, Rfl: 0  Past Medical History: Past Medical History:  Diagnosis Date   Arthritis    GERD (gastroesophageal reflux disease)    Headache    migraines    IBS (irritable bowel syndrome)     Tobacco Use: Social History   Tobacco Use  Smoking Status Former   Types: Cigarettes   Passive exposure: Current  Smokeless Tobacco Former    Labs: Review Flowsheet  More data may exist      Latest Ref Rng & Units 05/29/2023 05/30/2023 05/31/2023 06/01/2023 09/27/2023  Labs for ITP Cardiac  and Pulmonary Rehab  Cholestrol 100 - 199 mg/dL - 776  - - 832   LDL (calc) 0 - 99 mg/dL - 837  - - 93   HDL-C >60 mg/dL - 38  - - 31   Trlycerides 0 - 149 mg/dL - 883  - - 744   Hemoglobin A1c 4.8 - 5.6 % 5.4  - - - -  PH, Arterial 7.35 - 7.45 - 7.46  7.356  7.296  7.295  7.356  7.296  7.384  -  PCO2 arterial 32 - 48 mmHg - 35  41.8  42.3  45.1  40.1  44.1  35.8  -  Bicarbonate 20.0 - 28.0 mmol/L - 24.9  23.6  20.8  22.0  24.2  22.4  21.5  21.1  -  TCO2 22 - 32 mmol/L - - 25  22  22  23  23  24  25  24  26  23  26  22   -  Acid-base deficit 0.0 - 2.0 mmol/L - - 2.0  6.0  4.0  1.0  3.0  5.0  3.0  -  O2 Saturation % - 98.8  100  100  100  73  100  100  95  -    Details       Multiple values from one day are sorted in reverse-chronological order         Capillary Blood Glucose: Lab Results  Component Value Date   GLUCAP 119 (H) 09/11/2023   GLUCAP 111 (H) 08/16/2023   GLUCAP 128 (H) 06/05/2023   GLUCAP 121 (H) 06/05/2023   GLUCAP 127 (H) 06/04/2023     Exercise Target Goals: Exercise Program Goal: Individual exercise prescription set using results from initial 6 min walk test and THRR while considering  patient's activity barriers and safety.   Exercise Prescription Goal: Initial exercise prescription builds to 30-45 minutes a day of aerobic activity, 2-3 days per week.  Home exercise guidelines will be given to patient during program as part of exercise prescription that the participant will acknowledge.  Activity Barriers & Risk Stratification:  Activity Barriers & Cardiac Risk Stratification - 09/03/23 0904       Activity Barriers & Cardiac Risk Stratification   Activity Barriers Arthritis;Deconditioning;Decreased Ventricular Function    Cardiac Risk Stratification High          6 Minute Walk:  6 Minute Walk     Row Name 09/03/23 1323         6 Minute Walk   Phase Initial     Distance 1461 feet     Walk Time 6 minutes     # of Rest Breaks 0     MPH 2.77      METS 4.45  RPE 11     Perceived Dyspnea  1     VO2 Peak 15.57     Symptoms No     Resting HR 66 bpm     Resting BP 140/70     Resting Oxygen Saturation  98 %     Max Ex. HR 110 bpm     Max Ex. BP 164/64     2 Minute Post BP 120/68        Oxygen Initial Assessment:   Oxygen Re-Evaluation:   Oxygen Discharge (Final Oxygen Re-Evaluation):   Initial Exercise Prescription:  Initial Exercise Prescription - 09/03/23 0900       Date of Initial Exercise RX and Referring Provider   Date 09/03/23    Referring Provider Dr. Vishnu Mallipeddii    Expected Discharge Date 11/27/23      Treadmill   MPH 1.8    Grade 0    Minutes 15    METs 2.4      Recumbant Elliptical   Level 2    RPM 60    Watts 67    Minutes 15    METs 2.9      Prescription Details   Frequency (times per week) 3 days/week    Duration Progress to 30 minutes of continuous aerobic without signs/symptoms of physical distress      Intensity   THRR 40-80% of Max Heartrate 65-131    Ratings of Perceived Exertion 11-13    Perceived Dyspnea 0-4      Progression   Progression Continue to progress workloads to maintain intensity without signs/symptoms of physical distress.      Resistance Training   Training Prescription Yes    Weight 4lbs    Reps 10-15          Perform Capillary Blood Glucose checks as needed.  Exercise Prescription Changes:   Exercise Prescription Changes     Row Name 09/11/23 1100 09/25/23 1015           Response to Exercise   Blood Pressure (Admit) 128/78 142/74      Blood Pressure (Exercise) 178/82 168/72      Blood Pressure (Exit) 130/84 110/62      Heart Rate (Admit) 84 bpm 91 bpm      Heart Rate (Exercise) 113 bpm 126 bpm      Heart Rate (Exit) 96 bpm 100 bpm      Rating of Perceived Exertion (Exercise) 13 13      Symptoms None None      Comments Pt's first day in the CRP2 program Reviewed METs      Duration Continue with 30 min of aerobic exercise without  signs/symptoms of physical distress. Continue with 30 min of aerobic exercise without signs/symptoms of physical distress.      Intensity THRR unchanged THRR unchanged        Progression   Progression Continue to progress workloads to maintain intensity without signs/symptoms of physical distress. Continue to progress workloads to maintain intensity without signs/symptoms of physical distress.      Average METs 2.6 3        Resistance Training   Training Prescription No No      Weight No weights on Wednesdays No weights on Wednesdays      Reps 10-15 --      Time 5 Minutes --        Interval Training   Interval Training No No        Treadmill  MPH 1.8 2.2      Grade 0 0      Minutes 15 15      METs 2.4 2.69        Recumbant Elliptical   Level 2 2      RPM 34 61      Watts 50 80      Minutes 15 15      METs 2.8 3.3         Exercise Comments:   Exercise Comments     Row Name 09/03/23 0907 09/11/23 1105 09/25/23 0800       Exercise Comments Pt orientation, pt completed w/o unusual signs/symptoms. Pt's first day in the CRP2 program; pt exercised without complaints. Reviewed METs, pt is making good progress.        Exercise Goals and Review:   Exercise Goals     Row Name 09/03/23 9177             Exercise Goals   Increase Physical Activity Yes       Intervention Provide advice, education, support and counseling about physical activity/exercise needs.;Develop an individualized exercise prescription for aerobic and resistive training based on initial evaluation findings, risk stratification, comorbidities and participant's personal goals.       Expected Outcomes Short Term: Attend rehab on a regular basis to increase amount of physical activity.;Long Term: Exercising regularly at least 3-5 days a week.;Long Term: Add in home exercise to make exercise part of routine and to increase amount of physical activity.       Increase Strength and Stamina Yes        Intervention Provide advice, education, support and counseling about physical activity/exercise needs.;Develop an individualized exercise prescription for aerobic and resistive training based on initial evaluation findings, risk stratification, comorbidities and participant's personal goals.       Expected Outcomes Short Term: Increase workloads from initial exercise prescription for resistance, speed, and METs.;Short Term: Perform resistance training exercises routinely during rehab and add in resistance training at home;Long Term: Improve cardiorespiratory fitness, muscular endurance and strength as measured by increased METs and functional capacity ( )       Able to understand and use rate of perceived exertion (RPE) scale Yes       Intervention Provide education and explanation on how to use RPE scale       Expected Outcomes Short Term: Able to use RPE daily in rehab to express subjective intensity level;Long Term:  Able to use RPE to guide intensity level when exercising independently       Knowledge and understanding of Target Heart Rate Range (THRR) Yes       Intervention Provide education and explanation of THRR including how the numbers were predicted and where they are located for reference       Expected Outcomes Short Term: Able to state/look up THRR;Long Term: Able to use THRR to govern intensity when exercising independently;Short Term: Able to use daily as guideline for intensity in rehab       Understanding of Exercise Prescription Yes       Intervention Provide education, explanation, and written materials on patient's individual exercise prescription       Expected Outcomes Short Term: Able to explain program exercise prescription;Long Term: Able to explain home exercise prescription to exercise independently          Exercise Goals Re-Evaluation :  Exercise Goals Re-Evaluation     Row Name 09/11/23 [phone]  Exercise Goal Re-Evaluation   Exercise Goals Review  Increase Physical Activity;Understanding of Exercise Prescription;Increase Strength and Stamina;Knowledge and understanding of Target Heart Rate Range (THRR);Able to understand and use rate of perceived exertion (RPE) scale       Comments Pt's first day in the CRP2 program. Pt understands the exercise Rx, RPE scale and THRR.       Expected Outcomes Will continue to monitor the patient and progress exercise workloads as tolerated.          Discharge Exercise Prescription (Final Exercise Prescription Changes):  Exercise Prescription Changes - 09/25/23 1015       Response to Exercise   Blood Pressure (Admit) 142/74    Blood Pressure (Exercise) 168/72    Blood Pressure (Exit) 110/62    Heart Rate (Admit) 91 bpm    Heart Rate (Exercise) 126 bpm    Heart Rate (Exit) 100 bpm    Rating of Perceived Exertion (Exercise) 13    Symptoms None    Comments Reviewed METs    Duration Continue with 30 min of aerobic exercise without signs/symptoms of physical distress.    Intensity THRR unchanged      Progression   Progression Continue to progress workloads to maintain intensity without signs/symptoms of physical distress.    Average METs 3      Resistance Training   Training Prescription No    Weight No weights on Wednesdays      Interval Training   Interval Training No      Treadmill   MPH 2.2    Grade 0    Minutes 15    METs 2.69      Recumbant Elliptical   Level 2    RPM 61    Watts 80    Minutes 15    METs 3.3          Nutrition:  Target Goals: Understanding of nutrition guidelines, daily intake of sodium 1500mg , cholesterol 200mg , calories 30% from fat and 7% or less from saturated fats, daily to have 5 or more servings of fruits and vegetables.  Biometrics:   Post Biometrics - 09/03/23 0930        Post  Biometrics   Waist Circumference 35 inches    Hip Circumference 37 inches    Waist to Hip Ratio 0.95 %    Triceps Skinfold 7 mm    % Body Fat 20.6 %    Grip  Strength 26 kg    Flexibility 16.75 in    Single Leg Stand 30 seconds          Nutrition Therapy Plan and Nutrition Goals:  Nutrition Therapy & Goals - 09/11/23 1032       Nutrition Therapy   Diet Heart Healthy Diet    Drug/Food Interactions Statins/Certain Fruits      Personal Nutrition Goals   Nutrition Goal Patient to identify strategies for reducing cardiovascular risk by attending the [employer] education and nutrition series weekly.    Personal Goal #2 Patient to improve diet quality by using the plate method as a guide for meal planning to include lean protein/plant protein, fruits, vegetables, whole grains, nonfat dairy as part of a well-balanced diet.    Comments Patient has medical history of CAD s/p CABG, HFrEF, ICM, hyperlipidemia with goal LDL <55, hx of tobacco abuse (quit 05/28/23), emphysema, HTN. LDL is not at goal; he continues crestor . He does have significant GI history including H.pylori, IBS, etc; lowest weight in 2023 was 131#. He  continues follow-up for new lung nodule. Patient will benefit from participation in intensive cardiac rehab for nutrition education, exercise, and lifestyle modification.      Intervention Plan   Intervention Prescribe, educate and counsel regarding individualized specific dietary modifications aiming towards targeted core components such as weight, hypertension, lipid management, diabetes, heart failure and other comorbidities.;Nutrition handout(s) given to patient.    Expected Outcomes Short Term Goal: Understand basic principles of dietary content, such as calories, fat, sodium, cholesterol and nutrients.;Long Term Goal: Adherence to prescribed nutrition plan.          Nutrition Assessments:  Nutrition Assessments - 09/11/23 1037       Rate Your Plate Scores   Pre Score 53         MEDIFICTS Score Key: >=70 Need to make dietary changes  40-70 Heart Healthy Diet <= 40 Therapeutic Level Cholesterol Diet   Flowsheet Row  INTENSIVE CARDIAC REHAB from 09/11/2023 in [facility] for Heart, Vascular, & Lung Health  Picture Your Plate Total Score on Admission 53   Picture Your Plate Scores: <59 Unhealthy dietary pattern with much room for improvement. 41-50 Dietary pattern unlikely to meet recommendations for good health and room for improvement. 51-60 More healthful dietary pattern, with some room for improvement.  >60 Healthy dietary pattern, although there may be some specific behaviors that could be improved.    Nutrition Goals Re-Evaluation:  Nutrition Goals Re-Evaluation     Row Name 09/11/23 1032             Goals   Current Weight 164 lb 7.4 oz (74.6 kg)       Comment cholesterol 223, HDL 38, LDL 162, A1c WNL       Expected Outcome Patient has medical history of CAD s/p CABG, HFrEF, ICM, hyperlipidemia with goal LDL <55, hx of tobacco abuse (quit 05/28/23), emphysema, HTN. LDL is not at goal; he continues crestor . He does have significant GI history including H.pylori, IBS, etc; lowest weight in 2023 was 131#. He continues follow-up for new lung nodule. Patient will benefit from participation in intensive cardiac rehab for nutrition education, exercise, and lifestyle modification.          Nutrition Goals Re-Evaluation:  Nutrition Goals Re-Evaluation     Row Name 09/11/23 1032             Goals   Current Weight 164 lb 7.4 oz (74.6 kg)       Comment cholesterol 223, HDL 38, LDL 162, A1c WNL       Expected Outcome Patient has medical history of CAD s/p CABG, HFrEF, ICM, hyperlipidemia with goal LDL <55, hx of tobacco abuse (quit 05/28/23), emphysema, HTN. LDL is not at goal; he continues crestor . He does have significant GI history including H.pylori, IBS, etc; lowest weight in 2023 was 131#. He continues follow-up for new lung nodule. Patient will benefit from participation in intensive cardiac rehab for nutrition education, exercise, and lifestyle modification.           Nutrition Goals Discharge (Final Nutrition Goals Re-Evaluation):  Nutrition Goals Re-Evaluation - 09/11/23 1032       Goals   Current Weight 164 lb 7.4 oz (74.6 kg)    Comment cholesterol 223, HDL 38, LDL 162, A1c WNL    Expected Outcome Patient has medical history of CAD s/p CABG, HFrEF, ICM, hyperlipidemia with goal LDL <55, hx of tobacco abuse (quit 05/28/23), emphysema, HTN. LDL is not at goal; he continues crestor .  He does have significant GI history including H.pylori, IBS, etc; lowest weight in 2023 was 131#. He continues follow-up for new lung nodule. Patient will benefit from participation in intensive cardiac rehab for nutrition education, exercise, and lifestyle modification.          Psychosocial: Target Goals: Acknowledge presence or absence of significant depression and/or stress, maximize coping skills, provide positive support system. Participant is able to verbalize types and ability to use techniques and skills needed for reducing stress and depression.  Initial Review & Psychosocial Screening:  Initial Psych Review & Screening - 09/03/23 0910       Initial Review   Current issues with None Identified      Family Dynamics   Good Support System? Yes      Barriers   Psychosocial barriers to participate in program There are no identifiable barriers or psychosocial needs.      Screening Interventions   Interventions Encouraged to exercise;Provide feedback about the scores to participant    Expected Outcomes Short Term goal: Utilizing psychosocial counselor, staff and physician to assist with identification of specific Stressors or current issues interfering with healing process. Setting desired goal for each stressor or current issue identified.;Long Term Goal: Stressors or current issues are controlled or eliminated.;Short Term goal: Identification and review with participant of any Quality of Life or Depression concerns found by scoring the questionnaire.;Long Term  goal: The participant improves quality of Life and PHQ9 Scores as seen by post scores and/or verbalization of changes          Quality of Life Scores:  Quality of Life - 09/03/23 1233       Quality of Life   Select Quality of Life      Quality of Life Scores   Health/Function Pre 26.8 %    Socioeconomic Pre 25.36 %    Psych/Spiritual Pre 30 %    Family Pre 30 %    GLOBAL Pre 27.63 %         Scores of 19 and below usually indicate a poorer quality of life in these areas.  A difference of  2-3 points is a clinically meaningful difference.  A difference of 2-3 points in the total score of the Quality of Life Index has been associated with significant improvement in overall quality of life, self-image, physical symptoms, and general health in studies assessing change in quality of life.  PHQ-9: Review Flowsheet       09/03/2023 06/10/2017 04/29/2015  Depression screen PHQ 2/9  Decreased Interest 0 0 0  Down, Depressed, Hopeless 0 0 0  PHQ - 2 Score 0 0 0  Altered sleeping 0 - -  Tired, decreased energy 0 - -  Change in appetite 0 - -  Feeling bad or failure about yourself  0 - -  Trouble concentrating 0 - -  Moving slowly or fidgety/restless 0 - -  Suicidal thoughts 0 - -  PHQ-9 Score 0 - -  Difficult doing work/chores Not difficult at all - -   Interpretation of Total Score  Total Score Depression Severity:  1-4 = Minimal depression, 5-9 = Mild depression, 10-14 = Moderate depression, 15-19 = Moderately severe depression, 20-27 = Severe depression   Psychosocial Evaluation and Intervention:   Psychosocial Re-Evaluation:  Psychosocial Re-Evaluation     Row Name 09/26/23 1339             Psychosocial Re-Evaluation   Current issues with None Identified  Comments Diogo has good support from his signifigant other Beth who comes with [facility] to exercise.       Interventions Encouraged to attend Cardiac Rehabilitation for the exercise       Continue Psychosocial  Services  No Follow up required          Psychosocial Discharge (Final Psychosocial Re-Evaluation):  Psychosocial Re-Evaluation - 09/26/23 1339       Psychosocial Re-Evaluation   Current issues with None Identified    Comments Asher has good support from his signifigant other Beth who comes with [facility] to exercise.    Interventions Encouraged to attend Cardiac Rehabilitation for the exercise    Continue Psychosocial Services  No Follow up required          Vocational Rehabilitation: Provide vocational rehab assistance to qualifying candidates.   Vocational Rehab Evaluation & Intervention:  Vocational Rehab - 09/03/23 0857       Initial Vocational Rehab Evaluation & Intervention   Assessment shows need for Vocational Rehabilitation No   Pt is currently employed as a Adult nurse         Education: Education Goals: Education classes will be provided on a weekly basis, covering required topics. Participant will state understanding/return demonstration of topics presented.    Education     Row Name 09/11/23 0900     Education   Cardiac Education Topics [employer]   Secondary school teacher School   Educator Dietitian   Weekly Topic Delicious Desserts   Instruction Review Code 1- Verbalizes Understanding   Class Start Time 0815   Class Stop Time 0855   Class Time Calculation (min) 40 min    Row Name 09/13/23 0800     Education   Cardiac Education Topics [employer]   Select Core Videos     Core Videos   Educator Dietitian   Select Nutrition   Nutrition Calorie Density   Instruction Review Code 1- Verbalizes Understanding   Class Start Time [phone]   Class Stop Time 0858   Class Time Calculation (min) 42 min    Row Name 09/18/23 0800     Education   Cardiac Education Topics [employer]   Secondary school teacher School   Educator Dietitian   Weekly Topic Efficiency Cooking - Meals in a Snap   Instruction Review Code 1-  Verbalizes Understanding   Class Start Time 0815   Class Stop Time 0858   Class Time Calculation (min) 43 min    Row Name 09/20/23 0800     Education   Cardiac Education Topics [employer]   Psychologist, forensic Exercise Education   Exercise Education Move It!   Instruction Review Code 1- Verbalizes Understanding   Class Start Time 0815   Class Stop Time 0855   Class Time Calculation (min) 40 min    Row Name 09/25/23 1000     Education   Cardiac Education Topics [employer]   Secondary school teacher School   Educator Dietitian   Weekly Topic One-Pot Wonders   Instruction Review Code 1- Verbalizes Understanding   Class Start Time 0815   Class Stop Time 0855   Class Time Calculation (min) 40 min    Row Name 09/30/23 0800     Education   Cardiac Education Topics [employer]   [employer]  Educator Exercise Physiologist   Select Psychosocial   Psychosocial Workshop Focused Goals, Sustainable Changes   Instruction Review Code 1- Verbalizes Understanding   Class Start Time 0815   Class Stop Time 0850   Class Time Calculation (min) 35 min      Core Videos: Exercise    Move It!  Clinical staff conducted group or individual video education with verbal and written material and guidebook.  Patient learns the recommended Pritikin exercise program. Exercise with the goal of living a long, healthy life. Some of the health benefits of exercise include controlled diabetes, healthier blood pressure levels, improved cholesterol levels, improved heart and lung capacity, improved sleep, and better body composition. Everyone should speak with their doctor before starting or changing an exercise routine.  Biomechanical Limitations Clinical staff conducted group or individual video education with verbal and written material and guidebook.  Patient learns how biomechanical limitations can impact exercise  and how we can mitigate and possibly overcome limitations to have an impactful and balanced exercise routine.  Body Composition Clinical staff conducted group or individual video education with verbal and written material and guidebook.  Patient learns that body composition (ratio of muscle mass to fat mass) is a key component to assessing overall fitness, rather than body weight alone. Increased fat mass, especially visceral belly fat, can put us  at increased risk for metabolic syndrome, type 2 diabetes, heart disease, and even death. It is recommended to combine diet and exercise (cardiovascular and resistance training) to improve your body composition. Seek guidance from your physician and exercise physiologist before implementing an exercise routine.  Exercise Action Plan Clinical staff conducted group or individual video education with verbal and written material and guidebook.  Patient learns the recommended strategies to achieve and enjoy long-term exercise adherence, including variety, self-motivation, self-efficacy, and positive decision making. Benefits of exercise include fitness, good health, weight management, more energy, better sleep, less stress, and overall well-being.  Medical   Heart Disease Risk Reduction Clinical staff conducted group or individual video education with verbal and written material and guidebook.  Patient learns our heart is our most vital organ as it circulates oxygen, nutrients, white blood cells, and hormones throughout the entire body, and carries waste away. Data supports a plant-based eating plan like the Pritikin Program for its effectiveness in slowing progression of and reversing heart disease. The video provides a number of recommendations to address heart disease.   Metabolic Syndrome and Belly Fat  Clinical staff conducted group or individual video education with verbal and written material and guidebook.  Patient learns what metabolic syndrome is, how  it leads to heart disease, and how one can reverse it and keep it from coming back. You have metabolic syndrome if you have 3 of the following 5 criteria: abdominal obesity, high blood pressure, high triglycerides, low HDL cholesterol, and high blood sugar.  Hypertension and Heart Disease Clinical staff conducted group or individual video education with verbal and written material and guidebook.  Patient learns that high blood pressure, or hypertension, is very common in the [place] . Hypertension is largely due to excessive salt intake, but other important risk factors include being overweight, physical inactivity, drinking too much alcohol, smoking, and not eating enough potassium from fruits and vegetables. High blood pressure is a leading risk factor for heart attack, stroke, congestive heart failure, dementia, kidney failure, and premature death. Long-term effects of excessive salt intake include stiffening of the arteries and thickening of heart muscle and organ damage. Recommendations  include ways to reduce hypertension and the risk of heart disease.  Diseases of Our Time - Focusing on Diabetes Clinical staff conducted group or individual video education with verbal and written material and guidebook.  Patient learns why the best way to stop diseases of our time is prevention, through food and other lifestyle changes. Medicine (such as prescription pills and surgeries) is often only a Band-Aid on the problem, not a long-term solution. Most common diseases of our time include obesity, type 2 diabetes, hypertension, heart disease, and cancer. The Pritikin Program is recommended and has been proven to help reduce, reverse, and/or prevent the damaging effects of metabolic syndrome.  Nutrition   Overview of the Pritikin Eating Plan  Clinical staff conducted group or individual video education with verbal and written material and guidebook.  Patient learns about the Pritikin Eating Plan for  disease risk reduction. The Pritikin Eating Plan emphasizes a wide variety of unrefined, minimally-processed carbohydrates, like fruits, vegetables, whole grains, and legumes. Go, Caution, and Stop food choices are explained. Plant-based and lean animal proteins are emphasized. Rationale provided for low sodium intake for blood pressure control, low added sugars for blood sugar stabilization, and low added fats and oils for coronary artery disease risk reduction and weight management.  Calorie Density  Clinical staff conducted group or individual video education with verbal and written material and guidebook.  Patient learns about calorie density and how it impacts the Pritikin Eating Plan. Knowing the characteristics of the food you choose will help you decide whether those foods will lead to weight gain or weight loss, and whether you want to consume more or less of them. Weight loss is usually a side effect of the Pritikin Eating Plan because of its focus on low calorie-dense foods.  Label Reading  Clinical staff conducted group or individual video education with verbal and written material and guidebook.  Patient learns about the Pritikin recommended label reading guidelines and corresponding recommendations regarding calorie density, added sugars, sodium content, and whole grains.  Dining Out - Part 1  Clinical staff conducted group or individual video education with verbal and written material and guidebook.  Patient learns that restaurant meals can be sabotaging because they can be so high in calories, fat, sodium, and/or sugar. Patient learns recommended strategies on how to positively address this and avoid unhealthy pitfalls.  Facts on Fats  Clinical staff conducted group or individual video education with verbal and written material and guidebook.  Patient learns that lifestyle modifications can be just as effective, if not more so, as many medications for lowering your risk of heart  disease. A Pritikin lifestyle can help to reduce your risk of inflammation and atherosclerosis (cholesterol build-up, or plaque, in the artery walls). Lifestyle interventions such as dietary choices and physical activity address the cause of atherosclerosis. A review of the types of fats and their impact on blood cholesterol levels, along with dietary recommendations to reduce fat intake is also included.  Nutrition Action Plan  Clinical staff conducted group or individual video education with verbal and written material and guidebook.  Patient learns how to incorporate Pritikin recommendations into their lifestyle. Recommendations include planning and keeping personal health goals in mind as an important part of their success.  Healthy Mind-Set    Healthy Minds, Bodies, Hearts  Clinical staff conducted group or individual video education with verbal and written material and guidebook.  Patient learns how to identify when they are stressed. Video will discuss the impact of that  stress, as well as the many benefits of stress management. Patient will also be introduced to stress management techniques. The way we think, act, and feel has an impact on our hearts.  How Our Thoughts Can Heal Our Hearts  Clinical staff conducted group or individual video education with verbal and written material and guidebook.  Patient learns that negative thoughts can cause depression and anxiety. This can result in negative lifestyle behavior and serious health problems. Cognitive behavioral therapy is an effective method to help control our thoughts in order to change and improve our emotional outlook.  Additional Videos:  Exercise    Improving Performance  Clinical staff conducted group or individual video education with verbal and written material and guidebook.  Patient learns to use a non-linear approach by alternating intensity levels and lengths of time spent exercising to help burn more calories and lose more  body fat. Cardiovascular exercise helps improve heart health, metabolism, hormonal balance, blood sugar control, and recovery from fatigue. Resistance training improves strength, endurance, balance, coordination, reaction time, metabolism, and muscle mass. Flexibility exercise improves circulation, posture, and balance. Seek guidance from your physician and exercise physiologist before implementing an exercise routine and learn your capabilities and proper form for all exercise.  Introduction to Yoga  Clinical staff conducted group or individual video education with verbal and written material and guidebook.  Patient learns about yoga, a discipline of the coming together of mind, breath, and body. The benefits of yoga include improved flexibility, improved range of motion, better posture and core strength, increased lung function, weight loss, and positive self-image. Yoga's heart health benefits include lowered blood pressure, healthier heart rate, decreased cholesterol and triglyceride levels, improved immune function, and reduced stress. Seek guidance from your physician and exercise physiologist before implementing an exercise routine and learn your capabilities and proper form for all exercise.  Medical   Aging: Enhancing Your Quality of Life  Clinical staff conducted group or individual video education with verbal and written material and guidebook.  Patient learns key strategies and recommendations to stay in good physical health and enhance quality of life, such as prevention strategies, having an advocate, securing a Health Care Proxy and Power of Attorney, and keeping a list of medications and system for tracking them. It also discusses how to avoid risk for bone loss.  Biology of Weight Control  Clinical staff conducted group or individual video education with verbal and written material and guidebook.  Patient learns that weight gain occurs because we consume more calories than we burn  (eating more, moving less). Even if your body weight is normal, you may have higher ratios of fat compared to muscle mass. Too much body fat puts you at increased risk for cardiovascular disease, heart attack, stroke, type 2 diabetes, and obesity-related cancers. In addition to exercise, following the Pritikin Eating Plan can help reduce your risk.  Decoding Lab Results  Clinical staff conducted group or individual video education with verbal and written material and guidebook.  Patient learns that lab test reflects one measurement whose values change over time and are influenced by many factors, including medication, stress, sleep, exercise, food, hydration, pre-existing medical conditions, and more. It is recommended to use the knowledge from this video to become more involved with your lab results and evaluate your numbers to speak with your doctor.   Diseases of Our Time - Overview  Clinical staff conducted group or individual video education with verbal and written material and guidebook.  Patient learns that  according to the CDC, 50% to 70% of chronic diseases (such as obesity, type 2 diabetes, elevated lipids, hypertension, and heart disease) are avoidable through lifestyle improvements including healthier food choices, listening to satiety cues, and increased physical activity.  Sleep Disorders Clinical staff conducted group or individual video education with verbal and written material and guidebook.  Patient learns how good quality and duration of sleep are important to overall health and well-being. Patient also learns about sleep disorders and how they impact health along with recommendations to address them, including discussing with a physician.  Nutrition  Dining Out - Part 2 Clinical staff conducted group or individual video education with verbal and written material and guidebook.  Patient learns how to plan ahead and communicate in order to maximize their dining experience in a  healthy and nutritious manner. Included are recommended food choices based on the type of restaurant the patient is visiting.   Fueling a Banker conducted group or individual video education with verbal and written material and guidebook.  There is a strong connection between our food choices and our health. Diseases like obesity and type 2 diabetes are very prevalent and are in large-part due to lifestyle choices. The Pritikin Eating Plan provides plenty of food and hunger-curbing satisfaction. It is easy to follow, affordable, and helps reduce health risks.  Menu Workshop  Clinical staff conducted group or individual video education with verbal and written material and guidebook.  Patient learns that restaurant meals can sabotage health goals because they are often packed with calories, fat, sodium, and sugar. Recommendations include strategies to plan ahead and to communicate with the manager, chef, or server to help order a healthier meal.  Planning Your Eating Strategy  Clinical staff conducted group or individual video education with verbal and written material and guidebook.  Patient learns about the Pritikin Eating Plan and its benefit of reducing the risk of disease. The Pritikin Eating Plan does not focus on calories. Instead, it emphasizes high-quality, nutrient-rich foods. By knowing the characteristics of the foods, we choose, we can determine their calorie density and make informed decisions.  Targeting Your Nutrition Priorities  Clinical staff conducted group or individual video education with verbal and written material and guidebook.  Patient learns that lifestyle habits have a tremendous impact on disease risk and progression. This video provides eating and physical activity recommendations based on your personal health goals, such as reducing LDL cholesterol, losing weight, preventing or controlling type 2 diabetes, and reducing high blood  pressure.  Vitamins and Minerals  Clinical staff conducted group or individual video education with verbal and written material and guidebook.  Patient learns different ways to obtain key vitamins and minerals, including through a recommended healthy diet. It is important to discuss all supplements you take with your doctor.   Healthy Mind-Set    Smoking Cessation  Clinical staff conducted group or individual video education with verbal and written material and guidebook.  Patient learns that cigarette smoking and tobacco addiction pose a serious health risk which affects millions of people. Stopping smoking will significantly reduce the risk of heart disease, lung disease, and many forms of cancer. Recommended strategies for quitting are covered, including working with your doctor to develop a successful plan.  Culinary   Becoming a Set designer conducted group or individual video education with verbal and written material and guidebook.  Patient learns that cooking at home can be healthy, cost-effective, quick, and puts them in  control. Keys to cooking healthy recipes will include looking at your recipe, assessing your equipment needs, planning ahead, making it simple, choosing cost-effective seasonal ingredients, and limiting the use of added fats, salts, and sugars.  Cooking - Breakfast and Snacks  Clinical staff conducted group or individual video education with verbal and written material and guidebook.  Patient learns how important breakfast is to satiety and nutrition through the entire day. Recommendations include key foods to eat during breakfast to help stabilize blood sugar levels and to prevent overeating at meals later in the day. Planning ahead is also a key component.  Cooking - Educational psychologist conducted group or individual video education with verbal and written material and guidebook.  Patient learns eating strategies to improve overall  health, including an approach to cook more at home. Recommendations include thinking of animal protein as a side on your plate rather than center stage and focusing instead on lower calorie dense options like vegetables, fruits, whole grains, and plant-based proteins, such as beans. Making sauces in large quantities to freeze for later and leaving the skin on your vegetables are also recommended to maximize your experience.  Cooking - Healthy Salads and Dressing Clinical staff conducted group or individual video education with verbal and written material and guidebook.  Patient learns that vegetables, fruits, whole grains, and legumes are the foundations of the Pritikin Eating Plan. Recommendations include how to incorporate each of these in flavorful and healthy salads, and how to create homemade salad dressings. Proper handling of ingredients is also covered. Cooking - Soups and [employer] - Soups and Desserts Clinical staff conducted group or individual video education with verbal and written material and guidebook.  Patient learns that Pritikin soups and desserts make for easy, nutritious, and delicious snacks and meal components that are low in sodium, fat, sugar, and calorie density, while high in vitamins, minerals, and filling fiber. Recommendations include simple and healthy ideas for soups and desserts.   Overview     The Pritikin Solution Program Overview Clinical staff conducted group or individual video education with verbal and written material and guidebook.  Patient learns that the results of the Pritikin Program have been documented in more than 100 articles published in peer-reviewed journals, and the benefits include reducing risk factors for (and, in some cases, even reversing) high cholesterol, high blood pressure, type 2 diabetes, obesity, and more! An overview of the three key pillars of the Pritikin Program will be covered: eating well, doing regular exercise, and having a  healthy mind-set.  WORKSHOPS  Exercise: Exercise Basics: Building Your Action Plan Clinical staff led group instruction and group discussion with PowerPoint presentation and patient guidebook. To enhance the learning environment the use of posters, models and videos may be added. At the conclusion of this workshop, patients will comprehend the difference between physical activity and exercise, as well as the benefits of incorporating both, into their routine. Patients will understand the FITT (Frequency, Intensity, Time, and Type) principle and how to use it to build an exercise action plan. In addition, safety concerns and other considerations for exercise and cardiac rehab will be addressed by the presenter. The purpose of this lesson is to promote a comprehensive and effective weekly exercise routine in order to improve patients' overall level of fitness.   Managing Heart Disease: Your Path to a Healthier Heart Clinical staff led group instruction and group discussion with PowerPoint presentation and patient guidebook. To enhance the learning environment the  use of posters, models and videos may be added.At the conclusion of this workshop, patients will understand the anatomy and physiology of the heart. Additionally, they will understand how Pritikin's three pillars impact the risk factors, the progression, and the management of heart disease.  The purpose of this lesson is to provide a high-level overview of the heart, heart disease, and how the Pritikin lifestyle positively impacts risk factors.  Exercise Biomechanics Clinical staff led group instruction and group discussion with PowerPoint presentation and patient guidebook. To enhance the learning environment the use of posters, models and videos may be added. Patients will learn how the structural parts of their bodies function and how these functions impact their daily activities, movement, and exercise. Patients will learn how to  promote a neutral spine, learn how to manage pain, and identify ways to improve their physical movement in order to promote healthy living. The purpose of this lesson is to expose patients to common physical limitations that impact physical activity. Participants will learn practical ways to adapt and manage aches and pains, and to minimize their effect on regular exercise. Patients will learn how to maintain good posture while sitting, walking, and lifting.  Balance Training and Fall Prevention  Clinical staff led group instruction and group discussion with PowerPoint presentation and patient guidebook. To enhance the learning environment the use of posters, models and videos may be added. At the conclusion of this workshop, patients will understand the importance of their sensorimotor skills (vision, proprioception, and the vestibular system) in maintaining their ability to balance as they age. Patients will apply a variety of balancing exercises that are appropriate for their current level of function. Patients will understand the common causes for poor balance, possible solutions to these problems, and ways to modify their physical environment in order to minimize their fall risk. The purpose of this lesson is to teach patients about the importance of maintaining balance as they age and ways to minimize their risk of falling.  WORKSHOPS   Nutrition:  Fueling a Ship broker led group instruction and group discussion with PowerPoint presentation and patient guidebook. To enhance the learning environment the use of posters, models and videos may be added. Patients will review the foundational principles of the Pritikin Eating Plan and understand what constitutes a serving size in each of the food groups. Patients will also learn Pritikin-friendly foods that are better choices when away from home and review make-ahead meal and snack options. Calorie density will be reviewed and  applied to three nutrition priorities: weight maintenance, weight loss, and weight gain. The purpose of this lesson is to reinforce (in a group setting) the key concepts around what patients are recommended to eat and how to apply these guidelines when away from home by planning and selecting Pritikin-friendly options. Patients will understand how calorie density may be adjusted for different weight management goals.  Mindful Eating  Clinical staff led group instruction and group discussion with PowerPoint presentation and patient guidebook. To enhance the learning environment the use of posters, models and videos may be added. Patients will briefly review the concepts of the Pritikin Eating Plan and the importance of low-calorie dense foods. The concept of mindful eating will be introduced as well as the importance of paying attention to internal hunger signals. Triggers for non-hunger eating and techniques for dealing with triggers will be explored. The purpose of this lesson is to provide patients with the opportunity to review the basic principles of the Pritikin Eating  Plan, discuss the value of eating mindfully and how to measure internal cues of hunger and fullness using the Hunger Scale. Patients will also discuss reasons for non-hunger eating and learn strategies to use for controlling emotional eating.  Targeting Your Nutrition Priorities Clinical staff led group instruction and group discussion with PowerPoint presentation and patient guidebook. To enhance the learning environment the use of posters, models and videos may be added. Patients will learn how to determine their genetic susceptibility to disease by reviewing their family history. Patients will gain insight into the importance of diet as part of an overall healthy lifestyle in mitigating the impact of genetics and other environmental insults. The purpose of this lesson is to provide patients with the opportunity to assess their personal  nutrition priorities by looking at their family history, their own health history and current risk factors. Patients will also be able to discuss ways of prioritizing and modifying the Pritikin Eating Plan for their highest risk areas  Menu  Clinical staff led group instruction and group discussion with PowerPoint presentation and patient guidebook. To enhance the learning environment the use of posters, models and videos may be added. Using menus brought in from [employer], or printed from [employer], patients will apply the Pritikin dining out guidelines that were presented in the [employer] video. Patients will also be able to practice these guidelines in a variety of provided scenarios. The purpose of this lesson is to provide patients with the opportunity to practice hands-on learning of the Pritikin Dining Out guidelines with actual menus and practice scenarios.  Label Reading Clinical staff led group instruction and group discussion with PowerPoint presentation and patient guidebook. To enhance the learning environment the use of posters, models and videos may be added. Patients will review and discuss the Pritikin label reading guidelines presented in [employer]'s Label Reading Educational series video. Using fool labels brought in from local grocery stores and markets, patients will apply the label reading guidelines and determine if the packaged food meet the Pritikin guidelines. The purpose of this lesson is to provide patients with the opportunity to review, discuss, and practice hands-on learning of the Pritikin Label Reading guidelines with actual packaged food labels. Cooking School  Pritikin's [employer] are designed to teach patients ways to prepare quick, simple, and affordable recipes at home. The importance of nutrition's role in chronic disease risk reduction is reflected in its emphasis in the overall Pritikin program. By learning how to prepare  essential core Pritikin Eating Plan recipes, patients will increase control over what they eat; be able to customize the flavor of foods without the use of added salt, sugar, or fat; and improve the quality of the food they consume. By learning a set of core recipes which are easily assembled, quickly prepared, and affordable, patients are more likely to prepare more healthy foods at home. These workshops focus on convenient breakfasts, simple entres, side dishes, and desserts which can be prepared with minimal effort and are consistent with nutrition recommendations for cardiovascular risk reduction. Cooking [employer] are taught by a Armed forces logistics/support/administrative officer (RD) who has been trained by the [employer]. The chef or RD has a clear understanding of the importance of minimizing - if not completely eliminating - added fat, sugar, and sodium in recipes. Throughout the series of Cooking School Workshop sessions, patients will learn about healthy ingredients and efficient methods of cooking to build confidence in their capability to prepare  Cooking School weekly topics:  Adding Flavor- Sodium-Free  Fast and Healthy Breakfasts  Powerhouse Plant-Based Proteins  Satisfying Salads and Dressings  Simple Sides and Sauces  International Cuisine-Spotlight on the [employer] Zones  Delicious Desserts  Savory Soups  Efficiency Cooking - Meals in a Snap  Tasty Appetizers and Snacks  Comforting Weekend Breakfasts  One-Pot Wonders   Fast Evening Meals  Landscape architect Your Pritikin Plate  WORKSHOPS   Healthy Mindset (Psychosocial):  Focused Goals, Sustainable Changes Clinical staff led group instruction and group discussion with PowerPoint presentation and patient guidebook. To enhance the learning environment the use of posters, models and videos may be added. Patients will be able to apply effective goal setting strategies to establish at least one personal goal, and  then take consistent, meaningful action toward that goal. They will learn to identify common barriers to achieving personal goals and develop strategies to overcome them. Patients will also gain an understanding of how our mind-set can impact our ability to achieve goals and the importance of cultivating a positive and growth-oriented mind-set. The purpose of this lesson is to provide patients with a deeper understanding of how to set and achieve personal goals, as well as the tools and strategies needed to overcome common obstacles which may arise along the way.  From Head to Heart: The Power of a Healthy Outlook  Clinical staff led group instruction and group discussion with PowerPoint presentation and patient guidebook. To enhance the learning environment the use of posters, models and videos may be added. Patients will be able to recognize and describe the impact of emotions and mood on physical health. They will discover the importance of self-care and explore self-care practices which may work for them. Patients will also learn how to utilize the 4 C's to cultivate a healthier outlook and better manage stress and challenges. The purpose of this lesson is to demonstrate to patients how a healthy outlook is an essential part of maintaining good health, especially as they continue their cardiac rehab journey.  Healthy Sleep for a Healthy Heart Clinical staff led group instruction and group discussion with PowerPoint presentation and patient guidebook. To enhance the learning environment the use of posters, models and videos may be added. At the conclusion of this workshop, patients will be able to demonstrate knowledge of the importance of sleep to overall health, well-being, and quality of life. They will understand the symptoms of, and treatments for, common sleep disorders. Patients will also be able to identify daytime and nighttime behaviors which impact sleep, and they will be able to apply these  tools to help manage sleep-related challenges. The purpose of this lesson is to provide patients with a general overview of sleep and outline the importance of quality sleep. Patients will learn about a few of the most common sleep disorders. Patients will also be introduced to the concept of "sleep hygiene," and discover ways to self-manage certain sleeping problems through simple daily behavior changes. Finally, the workshop will motivate patients by clarifying the links between quality sleep and their goals of heart-healthy living.   Recognizing and Reducing Stress Clinical staff led group instruction and group discussion with PowerPoint presentation and patient guidebook. To enhance the learning environment the use of posters, models and videos may be added. At the conclusion of this workshop, patients will be able to understand the types of stress reactions, differentiate between acute and chronic stress, and recognize the impact that chronic stress has on their health. They will  also be able to apply different coping mechanisms, such as reframing negative self-talk. Patients will have the opportunity to practice a variety of stress management techniques, such as deep abdominal breathing, progressive muscle relaxation, and/or guided imagery.  The purpose of this lesson is to educate patients on the role of stress in their lives and to provide healthy techniques for coping with it.  Learning Barriers/Preferences:  Learning Barriers/Preferences - 09/03/23 0857       Learning Barriers/Preferences   Learning Barriers Sight   wears reading glasses   Learning Preferences Individual Instruction;Group Instruction;Skilled Demonstration;Verbal Instruction          Education Topics:  Knowledge Questionnaire Score:  Knowledge Questionnaire Score - 09/03/23 1232       Knowledge Questionnaire Score   Pre Score 22/24          Core Components/Risk Factors/Patient Goals at Admission:  Personal  Goals and Risk Factors at Admission - 09/03/23 0858       Core Components/Risk Factors/Patient Goals on Admission    Weight Management Weight Gain;Yes    Intervention Weight Management/Obesity: Establish reasonable short term and long term weight goals.;Weight Management: Provide education and appropriate resources to help participant work on and attain dietary goals.;Weight Management: Develop a combined nutrition and exercise program designed to reach desired caloric intake, while maintaining appropriate intake of nutrient and fiber, sodium and fats, and appropriate energy expenditure required for the weight goal.    Admit Weight 161 lb 12.8 oz (73.4 kg)    Goal Weight: Long Term 165 lb (74.8 kg)    Expected Outcomes Short Term: Continue to assess and modify interventions until short term weight is achieved;Long Term: Adherence to nutrition and physical activity/exercise program aimed toward attainment of established weight goal;Weight Gain: Understanding of general recommendations for a high calorie, high protein meal plan that promotes weight gain by distributing calorie intake throughout the day with the consumption for 4-5 meals, snacks, and/or supplements;Understanding of distribution of calorie intake throughout the day with the consumption of 4-5 meals/snacks;Understanding recommendations for meals to include 15-35% energy as protein, 25-35% energy from fat, 35-60% energy from carbohydrates, less than 200mg  of dietary cholesterol, 20-35 gm of total fiber daily;Weight Maintenance: Understanding of the daily nutrition guidelines, which includes 25-35% calories from fat, 7% or less cal from saturated fats, less than 200mg  cholesterol, less than 1.5gm of sodium, & 5 or more servings of fruits and vegetables daily    Improve shortness of breath with ADL's Yes    Intervention Provide education, individualized exercise plan and daily activity instruction to help decrease symptoms of SOB with activities  of daily living.    Expected Outcomes Short Term: Improve cardiorespiratory fitness to achieve a reduction of symptoms when performing ADLs;Long Term: Be able to perform more ADLs without symptoms or delay the onset of symptoms    Heart Failure Yes    Intervention Provide a combined exercise and nutrition program that is supplemented with education, support and counseling about heart failure. Directed toward relieving symptoms such as shortness of breath, decreased exercise tolerance, and extremity edema.    Expected Outcomes Improve functional capacity of life;Short term: Attendance in program 2-3 days a week with increased exercise capacity. Reported lower sodium intake. Reported increased fruit and vegetable intake. Reports medication compliance.;Short term: Daily weights obtained and reported for increase. Utilizing diuretic protocols set by physician.;Long term: Adoption of self-care skills and reduction of barriers for early signs and symptoms recognition and intervention leading to self-care maintenance.  Hypertension Yes    Intervention Monitor prescription use compliance.;Provide education on lifestyle modifcations including regular physical activity/exercise, weight management, moderate sodium restriction and increased consumption of fresh fruit, vegetables, and low fat dairy, alcohol moderation, and smoking cessation.    Expected Outcomes Short Term: Continued assessment and intervention until BP is < 140/19mm HG in hypertensive participants. < 130/54mm HG in hypertensive participants with diabetes, heart failure or chronic kidney disease.;Long Term: Maintenance of blood pressure at goal levels.    Lipids Yes    Intervention Provide education and support for participant on nutrition & aerobic/resistive exercise along with prescribed medications to achieve LDL 70mg , HDL >40mg .    Expected Outcomes Short Term: Participant states understanding of desired cholesterol values and is compliant with  medications prescribed. Participant is following exercise prescription and nutrition guidelines.;Long Term: Cholesterol controlled with medications as prescribed, with individualized exercise RX and with personalized nutrition plan. Value goals: LDL < 70mg , HDL > 40 mg.          Core Components/Risk Factors/Patient Goals Review:   Goals and Risk Factor Review     Row Name 09/26/23 1349             Core Components/Risk Factors/Patient Goals Review   Personal Goals Review Weight Management/Obesity;Heart Failure;Lipids;Improve shortness of breath with ADL's;Tobacco Cessation;Hypertension       Review Cliff has been doing well with exercise at cardiac rehab. some moderate exertional systolic BP elevations have been noted. Resting BP's have been WNL. Royal has gained 3.9 kg since starting cardiac rehab.       Expected Outcomes Anvay will continue to participate in cardiac rehab for exercise,nutrition and lifestyle modifications.          Core Components/Risk Factors/Patient Goals at Discharge (Final Review):   Goals and Risk Factor Review - 09/26/23 1349       Core Components/Risk Factors/Patient Goals Review   Personal Goals Review Weight Management/Obesity;Heart Failure;Lipids;Improve shortness of breath with ADL's;Tobacco Cessation;Hypertension    Review Jarrick has been doing well with exercise at cardiac rehab. some moderate exertional systolic BP elevations have been noted. Resting BP's have been WNL. Demaurion has gained 3.9 kg since starting cardiac rehab.    Expected Outcomes Johney will continue to participate in cardiac rehab for exercise,nutrition and lifestyle modifications.          ITP Comments:  ITP Comments     Row Name 09/03/23 9178 09/26/23 1338         ITP Comments Wilbert Bihari, MD:  Medical Director.  Introduction to the [facility] / Intensive Cardiac Rehab.  Initial orientation packet reviewed with the patient. 30 Day ITP Review. Marcellous started  cardiac rehab on 09/11/23. Amdrew is off to a good start to exercise.         Comments: See ITP comments.Hadassah Elpidio Quan RN BSN

## 2023-09-27 ENCOUNTER — Ambulatory Visit (HOSPITAL_COMMUNITY)
Admission: RE | Admit: 2023-09-27 | Discharge: 2023-09-27 | Disposition: A | Discharge status: Home / Self Care | Source: Ambulatory Visit | Attending: Emergency Medicine | Admitting: Emergency Medicine

## 2023-09-27 DIAGNOSIS — I10 Essential (primary) hypertension: Secondary | ICD-10-CM | POA: Insufficient documentation

## 2023-09-27 LAB — ECHOCARDIOGRAM COMPLETE
AR max vel: 4.14 cm2
AV Area VTI: 4.15 cm2
AV Area mean vel: 4.03 cm2
AV Mean grad: 2 mmHg
AV Peak grad: 4.6 mmHg
Ao pk vel: 1.07 m/s
Area-P 1/2: 4.1 cm2
Calc EF: 60.6 %
S' Lateral: 3.3 cm
Single Plane A2C EF: 60.8 %
Single Plane A4C EF: 60.2 %

## 2023-09-27 LAB — LIPID PANEL

## 2023-09-28 LAB — HEPATIC FUNCTION PANEL
ALT: 31 IU/L (ref 0–44)
AST: 32 IU/L (ref 0–40)
Albumin: 4 g/dL (ref 3.8–4.9)
Alkaline Phosphatase: 87 IU/L (ref 44–121)
Bilirubin Total: 0.3 mg/dL (ref 0.0–1.2)
Bilirubin, Direct: 0.09 mg/dL (ref 0.00–0.40)
Total Protein: 6.9 g/dL (ref 6.0–8.5)

## 2023-09-28 LAB — LIPID PANEL
Chol/HDL Ratio: 5.4 ratio — ABNORMAL HIGH (ref 0.0–5.0)
Cholesterol, Total: 167 mg/dL (ref 100–199)
HDL: 31 mg/dL — ABNORMAL LOW (ref >39)
LDL Chol Calc (NIH): 93 mg/dL (ref 0–99)
Triglycerides: 255 mg/dL — ABNORMAL HIGH (ref 0–149)
VLDL Cholesterol Cal: 43 mg/dL — ABNORMAL HIGH (ref 5–40)

## 2023-09-29 ENCOUNTER — Other Ambulatory Visit (INDEPENDENT_AMBULATORY_CARE_PROVIDER_SITE_OTHER): Payer: Self-pay | Admitting: Gastroenterology

## 2023-09-30 ENCOUNTER — Telehealth: Payer: Self-pay | Admitting: Emergency Medicine

## 2023-09-30 ENCOUNTER — Encounter (HOSPITAL_COMMUNITY)
Admission: RE | Admit: 2023-09-30 | Discharge: 2023-09-30 | Disposition: A | Discharge status: Home / Self Care | Source: Ambulatory Visit | Attending: Internal Medicine | Admitting: Internal Medicine

## 2023-09-30 DIAGNOSIS — Z48812 Encounter for surgical aftercare following surgery on the circulatory system: Secondary | ICD-10-CM | POA: Diagnosis not present

## 2023-09-30 DIAGNOSIS — Z951 Presence of aortocoronary bypass graft: Secondary | ICD-10-CM

## 2023-09-30 NOTE — Telephone Encounter (Signed)
 Patient returned call

## 2023-09-30 NOTE — Telephone Encounter (Signed)
 Was changed to omeprazole  at EGD on 04/24/2023

## 2023-09-30 NOTE — Telephone Encounter (Signed)
 Shared echo results with patient:  Echocardiogram shows improved and now normal left ventricular function at 55 to 60% (previously 35-40%). There are no regional wall motion abnormalities to suggest prior heart damage. Diastolic parameters are normal. Right ventricular function is normal. There were no valvular abnormalities. Overall reassuring echocardiogram given improvement in left ventricular function and wall motion abnormalities. Continue current medication regimen and follow-up as planned.     Scheduled patient for 4 month F/U with Lum Louis, NP for 10/29/23. Patient verbalized understanding of the above and expressed appreciation for call.

## 2023-10-02 ENCOUNTER — Encounter (HOSPITAL_COMMUNITY)
Admission: RE | Admit: 2023-10-02 | Discharge: 2023-10-02 | Disposition: A | Discharge status: Home / Self Care | Source: Ambulatory Visit | Attending: Internal Medicine

## 2023-10-02 ENCOUNTER — Encounter: Payer: Self-pay | Admitting: Vascular Surgery

## 2023-10-02 DIAGNOSIS — Z48812 Encounter for surgical aftercare following surgery on the circulatory system: Secondary | ICD-10-CM | POA: Diagnosis not present

## 2023-10-02 DIAGNOSIS — Z951 Presence of aortocoronary bypass graft: Secondary | ICD-10-CM

## 2023-10-04 ENCOUNTER — Encounter (HOSPITAL_COMMUNITY)
Admission: RE | Admit: 2023-10-04 | Discharge: 2023-10-04 | Disposition: A | Discharge status: Home / Self Care | Source: Ambulatory Visit | Attending: Internal Medicine

## 2023-10-04 ENCOUNTER — Other Ambulatory Visit: Payer: Self-pay | Admitting: *Deleted

## 2023-10-04 DIAGNOSIS — Z951 Presence of aortocoronary bypass graft: Secondary | ICD-10-CM

## 2023-10-04 DIAGNOSIS — Z48812 Encounter for surgical aftercare following surgery on the circulatory system: Secondary | ICD-10-CM | POA: Diagnosis not present

## 2023-10-04 MED ORDER — ROSUVASTATIN CALCIUM 40 MG PO TABS: 40.0000 mg | ORAL_TABLET | Freq: Every day | ORAL | 2 refills | Status: AC

## 2023-10-04 NOTE — Progress Notes (Signed)
 Reviewed home exercise Rx with patient today.  Encouraged warm-up, cool-down, and stretching. Reviewed THRR of 65 - 131 and keeping RPE between 11-13. Encouraged to hydrate with activity.  Reviewed weather parameters for temperature and humidity for safe exercise outdoors. Reviewed S/S to terminate exercise and when to call 911 vs MD. Pt encouraged to always carry a cell phone for safety when exercising outdoors. Pt verbalized understanding of the home exercise Rx and was provided a copy.   Lorin Picket MS, ACSM-CEP, CCRP

## 2023-10-07 ENCOUNTER — Encounter (HOSPITAL_COMMUNITY)
Admission: RE | Admit: 2023-10-07 | Discharge: 2023-10-07 | Disposition: A | Discharge status: Home / Self Care | Source: Ambulatory Visit | Attending: Internal Medicine | Admitting: Internal Medicine

## 2023-10-07 DIAGNOSIS — Z48812 Encounter for surgical aftercare following surgery on the circulatory system: Secondary | ICD-10-CM | POA: Diagnosis not present

## 2023-10-07 DIAGNOSIS — Z951 Presence of aortocoronary bypass graft: Secondary | ICD-10-CM

## 2023-10-09 ENCOUNTER — Other Ambulatory Visit: Payer: Self-pay | Admitting: Thoracic Surgery (Cardiothoracic Vascular Surgery)

## 2023-10-09 ENCOUNTER — Encounter (HOSPITAL_COMMUNITY)
Admission: RE | Admit: 2023-10-09 | Discharge: 2023-10-09 | Disposition: A | Discharge status: Home / Self Care | Source: Ambulatory Visit | Attending: Internal Medicine

## 2023-10-09 DIAGNOSIS — R911 Solitary pulmonary nodule: Secondary | ICD-10-CM

## 2023-10-09 DIAGNOSIS — Z951 Presence of aortocoronary bypass graft: Secondary | ICD-10-CM

## 2023-10-09 DIAGNOSIS — Z48812 Encounter for surgical aftercare following surgery on the circulatory system: Secondary | ICD-10-CM | POA: Diagnosis not present

## 2023-10-10 NOTE — Telephone Encounter (Signed)
 Copied from CRM #[id number]. Topic: Clinical Concerns - Medical Question >> Oct 10, 2023  9:59 AM Metta RAMAN wrote: Reginald [name] is calling other request    Include all details related to the request(s) below:  Needs to know why patient is restricted until 10/22/33   Confirm and type the Best Contact Number below:  Patient/caller contact number:  [phone]           [] Home  [] Mobile  [] Work [] Other   [x] Okay to leave a voicemail   Medication List:  Current Outpatient Medications:  .  clopidogreL  (PLAVIX ) 75 mg tablet, Take 1 tablet (75 mg total) by mouth daily., Disp: 90 tablet, Rfl: 3 .  dicyclomine  (BENTYL ) 10 mg capsule, Take 10 mg by mouth., Disp: , Rfl:  .  gabapentin  (NEURONTIN ) 300 mg capsule, TAKE 1 CAPSULE BY MOUTH THREE TIMES A DAY, Disp: 90 capsule, Rfl: 0 .  losartan  (COZAAR ) 25 mg tablet, Take 1 tablet (25 mg total) by mouth daily., Disp: 90 tablet, Rfl: 3 .  nicotine  (NICODERM CQ ) 21 mg/24 hr patch, APPLY 1 PATCH EVERY DAY, Disp: 28 patch, Rfl: 1 .  pantoprazole  (PROTONIX ) 40 mg EC tablet, Take 1 tablet by mouth in the morning and 1 tablet in the evening., Disp: , Rfl:  .  promethazine  (PHENERGAN ) 12.5 mg tablet, Take 12.5 mg by mouth every 6 (six) hours as needed., Disp: , Rfl:  .  spironolactone  (ALDACTONE ) 25 mg tablet, TAKE 1/2 TABLET BY MOUTH EVERY DAY, Disp: 45 tablet, Rfl: 0 .  sucralfate  (CARAFATE ) 1 gram tablet, Take 1 g by mouth., Disp: , Rfl:      Medication Request/Refills: Pharmacy Information (if applicable)   [] Not Applicable       []  Pharmacy listed  Send Medication Request to:                                                 [] Pharmacy not listed (added to pharmacy list in Epic) Send Medication Request to:      Listed Pharmacies: [id number] - [place], [place] - [address] - PHONE: [phone] - FAX: [phone]

## 2023-10-10 NOTE — Telephone Encounter (Signed)
 Returned called to [facility] in regards to message that she needed to know why patient is restricted. Unsure of the exact question she is asking when she says she wants to know why he is restricted, so left a message with her to return call to me.

## 2023-10-11 ENCOUNTER — Encounter (HOSPITAL_COMMUNITY)
Admission: RE | Admit: 2023-10-11 | Discharge: 2023-10-11 | Disposition: A | Discharge status: Home / Self Care | Source: Ambulatory Visit | Attending: Internal Medicine | Admitting: Internal Medicine

## 2023-10-11 DIAGNOSIS — Z48812 Encounter for surgical aftercare following surgery on the circulatory system: Secondary | ICD-10-CM | POA: Diagnosis not present

## 2023-10-11 DIAGNOSIS — Z951 Presence of aortocoronary bypass graft: Secondary | ICD-10-CM

## 2023-10-14 ENCOUNTER — Encounter (HOSPITAL_COMMUNITY)
Admission: RE | Admit: 2023-10-14 | Discharge: 2023-10-14 | Disposition: A | Discharge status: Home / Self Care | Source: Ambulatory Visit | Attending: Internal Medicine | Admitting: Internal Medicine

## 2023-10-14 VITALS — Ht 68.0 in | Wt 177.7 lb

## 2023-10-14 DIAGNOSIS — Z951 Presence of aortocoronary bypass graft: Secondary | ICD-10-CM

## 2023-10-14 DIAGNOSIS — Z48812 Encounter for surgical aftercare following surgery on the circulatory system: Secondary | ICD-10-CM | POA: Diagnosis not present

## 2023-10-14 NOTE — Progress Notes (Signed)
 Patient blood pressure noted at 184/102 on the octane elliptical heart rate 100. Telemetry rhythm Sinus. Workload decreased recheck BP 150/92. Patient asymptomatic. Medications reviewed. Taking as prescribed. Will notify onsite provider Damien Braver NP. Onsite provider notified per Damien Braver NP.  Per Damien Braver NP. I think ok to continue if he's feeling ok-would just monitor BP at home, if consistently > 130/80 mmHg 2 hrs after meds notify office.  Will notify patient and his wife who is in the lobby and continue to monitor the patient. Hadassah Elpidio Quan RN BSN

## 2023-10-16 ENCOUNTER — Encounter (HOSPITAL_COMMUNITY)
Admission: RE | Admit: 2023-10-16 | Discharge: 2023-10-16 | Disposition: A | Discharge status: Home / Self Care | Source: Ambulatory Visit | Attending: Internal Medicine

## 2023-10-16 DIAGNOSIS — Z48812 Encounter for surgical aftercare following surgery on the circulatory system: Secondary | ICD-10-CM | POA: Diagnosis not present

## 2023-10-16 DIAGNOSIS — Z951 Presence of aortocoronary bypass graft: Secondary | ICD-10-CM

## 2023-10-16 NOTE — Progress Notes (Unsigned)
 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 Requesting Provider:  Kerrin Elspeth BROCKS, *  HPI: Reginald [name] is a 58 y.o. (03/27/1965) male presenting at the request of Dr. 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  On exam, Reginald [name] was doing well, accompanied by his wife.  Since his surgery, he has been doing well.  Reginald [name] is complaint is intermittent right upper extremity numbness and pain.  The numbness he appreciates can occur at any point in the day, but he appreciates it most at night.  Notes numbness beginning at the wrist and involving the first second third and a half of the fourth digit accompanied by pain.  This is not precipitated with use -states he can work with the hand without having any issues, but randomly, the hand will go to sleep.  Denies rest pain, wounds.  Notes some intermittent blanching of the hand.  The pt is  on a statin for cholesterol management.  The pt is  on a daily aspirin .   Other AC:  plavix  Tobacco hx:  former  Past Medical History:  Diagnosis Date   Arthritis    GERD (gastroesophageal reflux disease)    Headache    migraines    IBS (irritable bowel syndrome)     Past Surgical History:  Procedure Laterality Date   BIOPSY  11/17/2021   Procedure: BIOPSY;  Surgeon: Eartha Angelia Sieving, MD;  Location: [facility];  Service: Gastroenterology;;   COLONOSCOPY WITH PROPOFOL  N/A 11/17/2021   Procedure: COLONOSCOPY WITH PROPOFOL ;  Surgeon: Eartha Angelia Sieving, MD;  Location: [facility];  Service: Gastroenterology;  Laterality: N/A;  200 ASA 2   CORONARY ARTERY BYPASS GRAFT N/A 05/31/2023   Procedure: CORONARY ARTERY BYPASS GRAFTING (CABG) TIMES THREE USING LEFT INTERNAL MAMMARY ARTERY AND OPENLY HARVESTED RIGHT AND LEFT GREATER SAPHENOUS VEIN;   Surgeon: Kerrin Elspeth BROCKS, MD;  Location: [facility] OR;  Service: Open Heart Surgery;  Laterality: N/A;   ESOPHAGOGASTRODUODENOSCOPY N/A 04/24/2023   Procedure: EGD (ESOPHAGOGASTRODUODENOSCOPY);  Surgeon: Eartha Angelia, Sieving, MD;  Location: [facility];  Service: Gastroenterology;  Laterality: N/A;  9:15AM;ASA 2   ESOPHAGOGASTRODUODENOSCOPY (EGD) WITH PROPOFOL  N/A 11/17/2021   Procedure: ESOPHAGOGASTRODUODENOSCOPY (EGD) WITH PROPOFOL ;  Surgeon: Eartha Angelia Sieving, MD;  Location: [facility];  Service: Gastroenterology;  Laterality: N/A;   INGUINAL HERNIA REPAIR Right 06/02/2015   Procedure: RIGHT INGUINAL HERNIA REPAIR WITH MESH;  Surgeon: Camellia Blush, MD;  Location: [facility] OR;  Service: General;  Laterality: Right;   INSERTION OF MESH Right 06/02/2015   Procedure: INSERTION OF MESH;  Surgeon: Camellia Blush, MD;  Location: [facility] OR;  Service: General;  Laterality: Right;   INTRAOPERATIVE TRANSESOPHAGEAL ECHOCARDIOGRAM N/A 05/31/2023   Procedure: ECHOCARDIOGRAM, TRANSESOPHAGEAL, INTRAOPERATIVE;  Surgeon: Kerrin Elspeth BROCKS, MD;  Location: [facility] OR;  Service: Open Heart Surgery;  Laterality: N/A;   LEFT HEART CATH AND CORONARY ANGIOGRAPHY N/A 05/29/2023   Procedure: LEFT HEART CATH AND CORONARY ANGIOGRAPHY;  Surgeon: [place]azil[place], Peter M, MD;  Location: [facility] INVASIVE CV LAB;  Service: Cardiovascular;  Laterality: N/A;   NO PAST SURGERIES     POLYPECTOMY  11/17/2021   Procedure: POLYPECTOMY;  Surgeon: Eartha Angelia Sieving, MD;  Location: [facility];  Service: Gastroenterology;;    Social History   Socioeconomic History   Marital status: Significant Other    Spouse name: Reginald [name], Reginald [name]  Number of children: 2   Years of education: Not on file   Highest education level: GED or equivalent  Occupational History   Not on file  Tobacco Use   Smoking status: Former    Types: Cigarettes    Passive exposure: Current   Smokeless tobacco: Former  Building services engineer status: Never Used  Substance and Sexual  Activity   Alcohol use: No    Alcohol/week: 0.0 standard drinks of alcohol    Comment: formerly a heavy drinker, quit 2002   Drug use: No   Sexual activity: Not on file  Other Topics Concern   Not on file  Social History Narrative   Not on file   Social Drivers of Health   Financial Resource Strain: Low Risk  (06/05/2023)   Overall Financial Resource Strain (CARDIA)    Difficulty of Paying Living Expenses: Not very hard  Food Insecurity: Low Risk  (09/16/2023)   Received from [facility]   Hunger Vital Sign    Within the past 12 months, you worried that your food would run out before you got money to buy more: Never true    Within the past 12 months, the food you bought just didn't last and you didn't have money to get more. : Never true  Transportation Needs: No Transportation Needs (09/16/2023)   Received from [facility]    In the past 12 months, has lack of reliable transportation kept you from medical appointments, meetings, work or from getting things needed for daily living? : No  Physical Activity: Sufficiently Active (03/20/2023)   Received from [facility]   Exercise Vital Sign    On average, how many days per week do you engage in moderate to strenuous exercise (like a brisk walk)?: 7 days    On average, how many minutes do you engage in exercise at this level?: 30 min  Stress: No Stress Concern Present (03/20/2023)   Received from [facility] Occupational [facility] - Occupational Stress Questionnaire    Feeling of Stress : Only a little  Social Connections: Moderately Integrated (03/20/2023)   Received from [facility]   Social Connection and Isolation Panel    In a typical week, how many times do you talk on the phone with family, friends, or neighbors?: More than three times a week    How often do you get together with friends or relatives?: Never    How often do you attend church or religious services?: More than 4  times per year    Do you belong to any clubs or organizations such as church groups, unions, fraternal or athletic groups, or school groups?: No    How often do you attend meetings of the clubs or organizations you belong to?: Never    Are you married, widowed, divorced, separated, never married, or living with a partner?: Living with partner  Intimate Partner Violence: Not At Risk (05/28/2023)   Humiliation, Afraid, Rape, and Kick questionnaire    Fear of Current or Ex-Partner: No    Emotionally Abused: No    Physically Abused: No    Sexually Abused: No   Family History  Problem Relation Age of Onset   Diabetes Mother    Osteoarthritis Mother    Heart disease Father     Current Outpatient Medications  Medication Sig Dispense Refill   aspirin  EC 81 MG tablet Take 1 tablet (81 mg total) by mouth  daily. Swallow whole.     clopidogrel  (PLAVIX ) 75 MG tablet Take 1 tablet (75 mg total) by mouth daily. 30 tablet 1   dicyclomine  (BENTYL ) 10 MG capsule TAKE 1 CAPSULE (10 MG TOTAL) BY MOUTH EVERY 12 (TWELVE) HOURS AS NEEDED (ABDOMINAL PAIN). 180 capsule 1   diphenoxylate -atropine  (LOMOTIL ) 2.5-0.025 MG tablet TAKE 1 TABLET BY MOUTH 4 (FOUR) TIMES DAILY AS NEEDED FOR DIARRHEA OR LOOSE STOOLS. 180 tablet 1   empagliflozin  (JARDIANCE ) 10 MG TABS tablet Take 1 tablet (10 mg total) by mouth daily. 30 tablet 1   furosemide  (LASIX ) 40 MG tablet Take 1 tablet (40 mg total) by mouth daily. 30 tablet 3   gabapentin  (NEURONTIN ) 300 MG capsule Take 1 capsule (300 mg total) by mouth 2 (two) times daily. 60 capsule 0   losartan  (COZAAR ) 25 MG tablet Take 1 tablet (25 mg total) by mouth daily. 30 tablet 1   MAGNESIUM  PO Take 1 tablet by mouth at bedtime.     metoprolol  succinate (TOPROL -XL) 50 MG 24 hr tablet Take 1 tablet (50 mg total) by mouth at bedtime. Take with or immediately following a meal. 30 tablet 1   Multiple Vitamin (MULTIVITAMIN) tablet Take 1 tablet by mouth at bedtime.     nicotine  (NICODERM CQ   - DOSED IN MG/24 HOURS) 21 mg/24hr patch Place 1 patch (21 mg total) onto the skin daily. 28 patch 0   nicotine  polacrilex (NICORETTE ) 2 MG gum Take 1 each (2 mg total) by mouth as needed for smoking cessation. (Patient not taking: Reported on 10/14/2023) 100 tablet 0   ondansetron  (ZOFRAN ) 4 MG tablet TAKE 1 TABLET BY MOUTH EVERY 8 HOURS AS NEEDED FOR NAUSEA AND VOMITING 90 tablet 3   oxyCODONE  (OXY IR/ROXICODONE ) 5 MG immediate release tablet Take 1 tablet (5 mg total) by mouth every 6 (six) hours as needed for severe pain (pain score 7-10). (Patient not taking: Reported on 10/14/2023) 56 tablet 0   potassium chloride  SA (KLOR-CON  M) 20 MEQ tablet Take 1 tablet (20 mEq total) by mouth daily. 90 tablet 1   promethazine  (PHENERGAN ) 12.5 MG tablet TAKE 1 TABLET BY MOUTH EVERY 6 HOURS AS NEEDED FOR NAUSEA OR VOMITING. 30 tablet 0   rosuvastatin  (CRESTOR ) 40 MG tablet Take 1 tablet (40 mg total) by mouth daily. 90 tablet 2   spironolactone  (ALDACTONE ) 25 MG tablet Take 1 tablet (25 mg total) by mouth daily. 30 tablet 1   sucralfate  (CARAFATE ) 1 g tablet Take 1 tablet (1 g total) by mouth 4 (four) times daily -  with meals and at bedtime. Dissolve tablet in 30ml of water, then drink slurry 120 tablet 1   sulfamethoxazole -trimethoprim  (BACTRIM  DS) 800-160 MG tablet Take 1 tablet by mouth 2 (two) times daily. 14 tablet 0   No current facility-administered medications for this visit.    Allergies  Allergen Reactions   Penicillins Rash    PCN: Immediate rash, facial/tongue/throat swelling, SOB or lightheadedness with hypotension.   (He has received Ceftriaxone  IM on 06/10/2017 without adverse reaction /none reported).      REVIEW OF SYSTEMS:  [X]  denotes positive finding, [ ]  denotes negative finding Cardiac  Comments:  Chest pain or chest pressure:    Shortness of breath upon exertion:    Short of breath when lying flat:    Irregular heart rhythm:        Vascular    Pain in calf, thigh, or hip  brought on by ambulation:    Pain in  feet at night that wakes you up from your sleep:     Blood clot in your veins:    Leg swelling:         Pulmonary    Oxygen at home:    Productive cough:     Wheezing:         Neurologic    Sudden weakness in arms or legs:     Sudden numbness in arms or legs:     Sudden onset of difficulty speaking or slurred speech:    Temporary loss of vision in one eye:     Problems with dizziness:         Gastrointestinal    Blood in stool:     Vomited blood:         Genitourinary    Burning when urinating:     Blood in urine:        Psychiatric    Major depression:         Hematologic    Bleeding problems:    Problems with blood clotting too easily:        Skin    Rashes or ulcers:        Constitutional    Fever or chills:      PHYSICAL EXAMINATION:  There were no vitals filed for this visit.  General:  WDWN in NAD; vital signs documented above Gait: Not observed HENT: WNL, normocephalic Pulmonary: normal  Cardiac: regular HR Abdomen: soft, NT, no masses Skin: without rashes Vascular Exam/Pulses:  Right Left  Radial none 2+ (normal)  Ulnar 2+ (normal) 2+ (normal)  Femoral    Popliteal    DP    PT     Extremities: without ischemic changes, without Gangrene , without cellulitis; without open wounds;  Musculoskeletal: no muscle wasting or atrophy  Neurologic: A&O X 3;  No focal weakness or paresthesias are detected Psychiatric:  The pt has Normal affect.   Non-Invasive Vascular Imaging:     UPPER EXTREMITY DUPLEX STUDY  Patient Name:  Reginald [name]  Date of Exam:   10/17/2023 Medical Rec #: [id number]     Accession #:    [id number] Date of Birth: 1965-08-19     Patient Gender: M Patient Age:   31 years Exam Location:  [address] Procedure:      VAS US  UPPER EXTREMITY ARTERIAL DUPLEX Referring Phys: Brizeida Mcmurry   --------------------------------------------------------------------------- -----   Indications: Pain  in arm.  Risk Factors:  Hypertension, current smoker, coronary artery disease. Other Factors: s/p CABG  Comparison Study: 06/03/23  Performing Technologist: Garnette Rockers    Examination Guidelines: A complete evaluation includes B-mode imaging, spectral Doppler, color Doppler, and power Doppler as needed of all accessible portions of each vessel. Bilateral testing is considered an integral part of a complete examination. Limited examinations for reoccurring indications may be performed as noted.    Right Doppler Findings: +---------------+----------+---------+--------+--------+ Site           PSV (cm/s)Waveform StenosisComments +---------------+----------+---------+--------+--------+ Subclavian Prox175       triphasic                 +---------------+----------+---------+--------+--------+ Subclavian Mid 163       triphasic                 +---------------+----------+---------+--------+--------+ Subclavian Dist162                                 +---------------+----------+---------+--------+--------+  Axillary       105       triphasic                 +---------------+----------+---------+--------+--------+ Brachial Dist  101       triphasic                 +---------------+----------+---------+--------+--------+ Radial Prox                       occluded         +---------------+----------+---------+--------+--------+ Radial Mid                        occluded         +---------------+----------+---------+--------+--------+ Radial Dist                       occluded         +---------------+----------+---------+--------+--------+ Ulnar Dist     73        triphasic                 +---------------+----------+---------+--------+--------+ Palmar Arch    22        triphasic                 +---------------+----------+---------+--------+--------+      ASSESSMENT/PLAN: Reginald [name] is a 58 y.o. male presenting  with intermittent numbness in the right wrist which extends into the hand affecting the first second third and half of the fourth digit.  On physical exam, he has a 2+ ulnar pulse with an excellent palmar arch signal.  Digital signals are present as well.  On chart review, preoperative CABG ultrasound demonstrated intact palmar arch in the right hand Imaging today demonstrated known occlusion of the radial artery with triphasic ulnar signals, multiphasic palmar arch.  Reginald [name] and I had a long discussion regarding his symptoms, and his radial artery occlusion.  Surgery was not offered during his primary hospitalization as forearm and hand pain significantly improved with time and preoperative imaging demonstrated intact palmar arch with no concern for ischemia.  He is aware at this point, I cannot recanalize the radial artery.  I question whether he has some median nerve involvement, whether it be intermittent compression versus carpal tunnel syndrome.  In an effort to help elucidate the diagnosis, I have referred him to a colleague, Dr. Wendi surgery for workup. I asked him to continue his current medication regimen and call should any questions or concerns arise.     Reginald [name] FORBES Rim, MD [facility] [phone]

## 2023-10-17 ENCOUNTER — Encounter: Payer: Self-pay | Admitting: Vascular Surgery

## 2023-10-17 ENCOUNTER — Ambulatory Visit (INDEPENDENT_AMBULATORY_CARE_PROVIDER_SITE_OTHER): Admitting: Vascular Surgery

## 2023-10-17 ENCOUNTER — Ambulatory Visit (HOSPITAL_COMMUNITY)
Admission: RE | Admit: 2023-10-17 | Discharge: 2023-10-17 | Disposition: A | Discharge status: Home / Self Care | Source: Ambulatory Visit | Attending: Vascular Surgery | Admitting: Vascular Surgery

## 2023-10-17 VITALS — BP 127/86 | HR 84 | Temp 98.2°F | Resp 18 | Ht 68.0 in | Wt 177.8 lb

## 2023-10-17 DIAGNOSIS — R202 Paresthesia of skin: Secondary | ICD-10-CM | POA: Diagnosis not present

## 2023-10-17 DIAGNOSIS — R200 Anesthesia of skin: Secondary | ICD-10-CM | POA: Diagnosis not present

## 2023-10-17 DIAGNOSIS — M79601 Pain in right arm: Secondary | ICD-10-CM | POA: Diagnosis not present

## 2023-10-18 ENCOUNTER — Encounter (HOSPITAL_COMMUNITY)
Admission: RE | Admit: 2023-10-18 | Discharge: 2023-10-18 | Disposition: A | Discharge status: Home / Self Care | Source: Ambulatory Visit | Attending: Internal Medicine | Admitting: Internal Medicine

## 2023-10-18 DIAGNOSIS — Z951 Presence of aortocoronary bypass graft: Secondary | ICD-10-CM

## 2023-10-18 DIAGNOSIS — Z48812 Encounter for surgical aftercare following surgery on the circulatory system: Secondary | ICD-10-CM | POA: Diagnosis not present

## 2023-10-21 ENCOUNTER — Encounter (HOSPITAL_COMMUNITY)
Admission: RE | Admit: 2023-10-21 | Discharge: 2023-10-21 | Disposition: A | Discharge status: Home / Self Care | Source: Ambulatory Visit | Attending: Internal Medicine | Admitting: Internal Medicine

## 2023-10-21 DIAGNOSIS — Z48812 Encounter for surgical aftercare following surgery on the circulatory system: Secondary | ICD-10-CM | POA: Diagnosis not present

## 2023-10-21 DIAGNOSIS — Z951 Presence of aortocoronary bypass graft: Secondary | ICD-10-CM

## 2023-10-23 ENCOUNTER — Encounter (HOSPITAL_COMMUNITY)

## 2023-10-25 ENCOUNTER — Encounter (HOSPITAL_COMMUNITY)

## 2023-10-28 ENCOUNTER — Encounter (HOSPITAL_COMMUNITY)

## 2023-10-28 NOTE — Progress Notes (Signed)
 Discharge Progress Report  Patient Details  Name: JAYSHON DOMMER MRN: [id number] Date of Birth: 08-24-1965 Referring Provider:   Flowsheet Row INTENSIVE CARDIAC REHAB ORIENT from 09/03/2023 in [facility]  Referring Provider Dr. Diannah Fender     Number of Visits: 32  Reason for Discharge:  Patient reached a stable level of exercise. Patient independent in their exercise. Patient has met program and personal goals.  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 Vishnu Mallipeddii    Expected Discharge Date 11/27/23      Treadmill   MPH 1.8    Grade 0    Minutes 15    METs 2.4      Recumbant Elliptical   Level 2    RPM 60    Watts 67    Minutes 15    METs 2.9      Prescription Details   Frequency (times per week) 3 days/week    Duration Progress to 30 minutes of continuous aerobic without signs/symptoms of physical distress      Intensity   THRR 40-80% of Max Heartrate 65-131    Ratings of Perceived Exertion 11-13    Perceived Dyspnea 0-4      Progression   Progression Continue to progress workloads to maintain intensity without signs/symptoms of physical distress.      Resistance Training   Training Prescription Yes    Weight 4lbs    Reps 10-15          Discharge Exercise Prescription (Final Exercise Prescription Changes):  Exercise Prescription Changes - 10/21/23 1104       Response to Exercise   Blood Pressure (Admit) 142/70    Blood Pressure (Exit) 100/74    Heart Rate (Admit) 83 bpm    Heart Rate (Exercise) 115 bpm    Heart Rate (Exit) 92 bpm     Rating of Perceived Exertion (Exercise) 11    Symptoms None    Comments Pt graduated from the CRP2 program today    Duration Continue with 30 min of aerobic exercise without signs/symptoms of physical distress.    Intensity THRR unchanged      Progression   Progression Continue to progress workloads to maintain intensity without signs/symptoms of physical distress.    Average METs 2.95      Resistance Training   Training Prescription Yes    Weight 7 lbs    Reps 10-15    Time 5 Minutes      Interval Training   Interval Training No      NuStep   Level 4    SPM 89    Minutes 15    METs 2.6      Recumbant Elliptical   Level 3    RPM 49    Watts 62    Minutes 15    METs 3.2      Home Exercise Plan   Plans to continue exercise at Home (comment)    Frequency Add 2 additional days to program exercise sessions.    Initial Home Exercises Provided 10/04/23  Functional Capacity:  6 Minute Walk     Row Name 09/03/23 1323 10/18/23 0918       6 Minute Walk   Phase Initial Discharge    Distance 1461 feet 1200 feet    Distance % Change -- -17.86 %    Distance Feet Change -- -261 ft    Walk Time 6 minutes 6 minutes    # of Rest Breaks 0 4  breaks due to low back pain    MPH 2.77 2.3    METS 4.45 3.9    RPE 11 13    Perceived Dyspnea  1 2    VO2 Peak 15.57 13.7    Symptoms No Yes (comment)    Comments -- Pt suffering from low back pain today before test 4-5/10. During test pain was 7/10. Pt need 4 breaks to rest his back. Pt had SOB, RPD =2    Resting HR 66 bpm 82 bpm    Resting BP 140/70 130/70    Resting Oxygen Saturation  98 % --    Max Ex. HR 110 bpm 113 bpm    Max Ex. BP 164/64 168/78    2 Minute Post BP 120/68 --       Psychological, QOL, Others - Outcomes: PHQ 2/9:    10/18/2023    8:05 AM 09/03/2023    9:09 AM 06/10/2017   12:48 PM 04/29/2015   12:31 PM  Depression screen PHQ 2/9  Decreased Interest 0 0 0 0  Down, Depressed, Hopeless 0 0 0 0   PHQ - 2 Score 0 0 0 0  Altered sleeping 1 0    Tired, decreased energy 0 0    Change in appetite 0 0    Feeling bad or failure about yourself  0 0    Trouble concentrating 0 0    Moving slowly or fidgety/restless 1 0    Suicidal thoughts 0 0    PHQ-9 Score 2 0    Difficult doing work/chores Not difficult at all Not difficult at all      Quality of Life:  Quality of Life - 10/22/23 1114       Quality of Life   Select Quality of Life   Pt did not return QOL         Personal Goals: Goals established at orientation with interventions provided to work toward goal.  Personal Goals and Risk Factors at Admission - 09/03/23 0858       Core Components/Risk Factors/Patient Goals on Admission    Weight Management Weight Gain;Yes    Intervention Weight Management/Obesity: Establish reasonable short term and long term weight goals.;Weight Management: Provide education and appropriate resources to help participant work on and attain dietary goals.;Weight Management: Develop a combined nutrition and exercise program designed to reach desired caloric intake, while maintaining appropriate intake of nutrient and fiber, sodium and fats, and appropriate energy expenditure required for the weight goal.    Admit Weight 161 lb 12.8 oz (73.4 kg)    Goal Weight: Long Term 165 lb (74.8 kg)    Expected Outcomes Short Term: Continue to assess and modify interventions until short term weight is achieved;Long Term: Adherence to nutrition and physical activity/exercise program aimed toward attainment of established weight goal;Weight Gain: Understanding of general recommendations for a high calorie, high protein meal plan that promotes weight gain by distributing calorie intake throughout the day with the consumption for 4-5 meals, snacks, and/or supplements;Understanding of distribution of calorie intake throughout the  day with the consumption of 4-5 meals/snacks;Understanding recommendations for meals to  include 15-35% energy as protein, 25-35% energy from fat, 35-60% energy from carbohydrates, less than 200mg  of dietary cholesterol, 20-35 gm of total fiber daily;Weight Maintenance: Understanding of the daily nutrition guidelines, which includes 25-35% calories from fat, 7% or less cal from saturated fats, less than 200mg  cholesterol, less than 1.5gm of sodium, & 5 or more servings of fruits and vegetables daily    Improve shortness of breath with ADL's Yes    Intervention Provide education, individualized exercise plan and daily activity instruction to help decrease symptoms of SOB with activities of daily living.    Expected Outcomes Short Term: Improve cardiorespiratory fitness to achieve a reduction of symptoms when performing ADLs;Long Term: Be able to perform more ADLs without symptoms or delay the onset of symptoms    Heart Failure Yes    Intervention Provide a combined exercise and nutrition program that is supplemented with education, support and counseling about heart failure. Directed toward relieving symptoms such as shortness of breath, decreased exercise tolerance, and extremity edema.    Expected Outcomes Improve functional capacity of life;Short term: Attendance in program 2-3 days a week with increased exercise capacity. Reported lower sodium intake. Reported increased fruit and vegetable intake. Reports medication compliance.;Short term: Daily weights obtained and reported for increase. Utilizing diuretic protocols set by physician.;Long term: Adoption of self-care skills and reduction of barriers for early signs and symptoms recognition and intervention leading to self-care maintenance.    Hypertension Yes    Intervention Monitor prescription use compliance.;Provide education on lifestyle modifcations including regular physical activity/exercise, weight management, moderate sodium restriction and increased consumption of fresh fruit, vegetables, and low fat dairy, alcohol moderation, and  smoking cessation.    Expected Outcomes Short Term: Continued assessment and intervention until BP is < 140/48mm HG in hypertensive participants. < 130/74mm HG in hypertensive participants with diabetes, heart failure or chronic kidney disease.;Long Term: Maintenance of blood pressure at goal levels.    Lipids Yes    Intervention Provide education and support for participant on nutrition & aerobic/resistive exercise along with prescribed medications to achieve LDL 70mg , HDL >40mg .    Expected Outcomes Short Term: Participant states understanding of desired cholesterol values and is compliant with medications prescribed. Participant is following exercise prescription and nutrition guidelines.;Long Term: Cholesterol controlled with medications as prescribed, with individualized exercise RX and with personalized nutrition plan. Value goals: LDL < 70mg , HDL > 40 mg.           Personal Goals Discharge:  Goals and Risk Factor Review     Row Name 09/26/23 1349             Core Components/Risk Factors/Patient Goals Review   Personal Goals Review Weight Management/Obesity;Heart Failure;Lipids;Improve shortness of breath with ADL's;Tobacco Cessation;Hypertension       Review Coreon has been doing well with exercise at cardiac rehab. some moderate exertional systolic BP elevations have been noted. Resting BP's have been WNL. Kwadwo has gained 3.9 kg since starting cardiac rehab.       Expected Outcomes Kelven will continue to participate in cardiac rehab for exercise,nutrition and lifestyle modifications.          Exercise Goals and Review:  Exercise Goals     Row Name 09/03/23 9177             Exercise Goals   Increase Physical Activity Yes       Intervention Provide advice, education, support and  counseling about physical activity/exercise needs.;Develop an individualized exercise prescription for aerobic and resistive training based on initial evaluation findings, risk stratification,  comorbidities and participant's personal goals.       Expected Outcomes Short Term: Attend rehab on a regular basis to increase amount of physical activity.;Long Term: Exercising regularly at least 3-5 days a week.;Long Term: Add in home exercise to make exercise part of routine and to increase amount of physical activity.       Increase Strength and Stamina Yes       Intervention Provide advice, education, support and counseling about physical activity/exercise needs.;Develop an individualized exercise prescription for aerobic and resistive training based on initial evaluation findings, risk stratification, comorbidities and participant's personal goals.       Expected Outcomes Short Term: Increase workloads from initial exercise prescription for resistance, speed, and METs.;Short Term: Perform resistance training exercises routinely during rehab and add in resistance training at home;Long Term: Improve cardiorespiratory fitness, muscular endurance and strength as measured by increased METs and functional capacity ( )       Able to understand and use rate of perceived exertion (RPE) scale Yes       Intervention Provide education and explanation on how to use RPE scale       Expected Outcomes Short Term: Able to use RPE daily in rehab to express subjective intensity level;Long Term:  Able to use RPE to guide intensity level when exercising independently       Knowledge and understanding of Target Heart Rate Range (THRR) Yes       Intervention Provide education and explanation of THRR including how the numbers were predicted and where they are located for reference       Expected Outcomes Short Term: Able to state/look up THRR;Long Term: Able to use THRR to govern intensity when exercising independently;Short Term: Able to use daily as guideline for intensity in rehab       Understanding of Exercise Prescription Yes       Intervention Provide education, explanation, and written materials on patient's  individual exercise prescription       Expected Outcomes Short Term: Able to explain program exercise prescription;Long Term: Able to explain home exercise prescription to exercise independently          Exercise Goals Re-Evaluation:  Exercise Goals Re-Evaluation     Row Name 09/11/23 1103 10/04/23 0800 10/21/23 1107         Exercise Goal Re-Evaluation   Exercise Goals Review Increase Physical Activity;Understanding of Exercise Prescription;Increase Strength and Stamina;Knowledge and understanding of Target Heart Rate Range (THRR);Able to understand and use rate of perceived exertion (RPE) scale Increase Physical Activity;Understanding of Exercise Prescription;Increase Strength and Stamina;Knowledge and understanding of Target Heart Rate Range (THRR);Able to understand and use rate of perceived exertion (RPE) scale Increase Physical Activity;Understanding of Exercise Prescription;Increase Strength and Stamina;Knowledge and understanding of Target Heart Rate Range (THRR);Able to understand and use rate of perceived exertion (RPE) scale     Comments Pt's first day in the CRP2 program. Pt understands the exercise Rx, RPE scale and THRR. Reviewed goals and home exercise Rx. Pt voices progress on his goals of increased strength, stamina, and energy. Pt has long term goal to get back into lifting weights so patoient will do that starting htis weekend. Pt has been walking at home several times per weeek for 15-30 minutes depending on how his low back feels. Pt also has airdyne bike at home and can use whn unable to walk  outdoors. Pt graduated from the CRP2 program today.  Pt continus to voice progress on his goals of increased strength, stamina, and energy. Pt has long term goal to get back into lifting weights so patient has been doing this at home with his equipment. Pt has been walking at home several times per weeek for 15-30 minutes depending on how his low back feels. Pt also has airdyne bike at home  and can use when unable to walk outdoors.     Expected Outcomes Will continue to monitor the patient and progress exercise workloads as tolerated. Will continue to monitor the patient and progress exercise workloads as tolerated. Pt will continue to exercise at home by walking, riding his bike and weight training.        Nutrition & Weight - Outcomes:   Post Biometrics - 10/16/23 0800        Post  Biometrics   Height 5' 8 (1.727 m)    Weight 80.6 kg    Waist Circumference 40 inches    Hip Circumference 38 inches    Waist to Hip Ratio 1.05 %    BMI (Calculated) 27.02    Triceps Skinfold 7 mm    % Body Fat 23.9 %    Grip Strength 26 kg    Flexibility --   not done active back pain   Single Leg Stand 15 seconds   voices back pain         Nutrition:  Nutrition Therapy & Goals - 09/11/23 1032       Nutrition Therapy   Diet Heart Healthy Diet    Drug/Food Interactions Statins/Certain Fruits      Personal Nutrition Goals   Nutrition Goal Patient to identify strategies for reducing cardiovascular risk by attending the [employer] education and nutrition series weekly.    Personal Goal #2 Patient to improve diet quality by using the plate method as a guide for meal planning to include lean protein/plant protein, fruits, vegetables, whole grains, nonfat dairy as part of a well-balanced diet.    Comments Patient has medical history of CAD s/p CABG, HFrEF, ICM, hyperlipidemia with goal LDL <55, hx of tobacco abuse (quit 05/28/23), emphysema, HTN. LDL is not at goal; he continues crestor . He does have significant GI history including H.pylori, IBS, etc; lowest weight in 2023 was 131#. He continues follow-up for new lung nodule. Patient will benefit from participation in intensive cardiac rehab for nutrition education, exercise, and lifestyle modification.      Intervention Plan   Intervention Prescribe, educate and counsel regarding individualized specific dietary modifications aiming towards  targeted core components such as weight, hypertension, lipid management, diabetes, heart failure and other comorbidities.;Nutrition handout(s) given to patient.    Expected Outcomes Short Term Goal: Understand basic principles of dietary content, such as calories, fat, sodium, cholesterol and nutrients.;Long Term Goal: Adherence to prescribed nutrition plan.          Nutrition Discharge:  Nutrition Assessments - 09/11/23 1037       Rate Your Plate Scores   Pre Score 53          Education Questionnaire Score:  Knowledge Questionnaire Score - 10/22/23 1114       Knowledge Questionnaire Score   Post Score --   Did not return post test         Goals reviewed with patient; copy given to patient.Pt graduates from  Intensive/Traditional cardiac rehab program on 10/21/23 with completion of  16 exercise and 16 education sessions.  Pt maintained good attendance and progressed nicely during their participation in rehab as evidenced by increased MET level. Barre gained 6.5 kg while enrolled and decreased his distance on his post exercise walk test due to chronic back pain.   Medication list reconciled. Repeat  PHQ score-2  .  Pt has made significant lifestyle changes and should be commended for their success.  Shanna  achieved their goals during cardiac rehab.   Pt plans to continue exercise at home as tolerated. Rykar says he continues to abstain from smoking. Quamere stopped cardiac rehab early to return to work. We are proud of Yasin's progress! Hadassah Elpidio Quan RN BSN

## 2023-10-29 ENCOUNTER — Ambulatory Visit: Admitting: Emergency Medicine

## 2023-10-30 ENCOUNTER — Encounter (HOSPITAL_COMMUNITY)

## 2023-11-01 ENCOUNTER — Encounter (HOSPITAL_COMMUNITY)

## 2023-11-04 ENCOUNTER — Encounter (HOSPITAL_COMMUNITY)

## 2023-11-06 ENCOUNTER — Encounter (HOSPITAL_COMMUNITY)

## 2023-11-06 ENCOUNTER — Encounter (INDEPENDENT_AMBULATORY_CARE_PROVIDER_SITE_OTHER): Payer: Self-pay | Admitting: Gastroenterology

## 2023-11-07 ENCOUNTER — Encounter: Payer: Self-pay | Admitting: Emergency Medicine

## 2023-11-07 ENCOUNTER — Ambulatory Visit: Attending: Cardiology | Admitting: Emergency Medicine

## 2023-11-07 VITALS — BP 136/70 | HR 72 | Ht 68.0 in | Wt 179.0 lb

## 2023-11-07 DIAGNOSIS — I2510 Atherosclerotic heart disease of native coronary artery without angina pectoris: Secondary | ICD-10-CM | POA: Insufficient documentation

## 2023-11-07 DIAGNOSIS — Z951 Presence of aortocoronary bypass graft: Secondary | ICD-10-CM | POA: Diagnosis not present

## 2023-11-07 DIAGNOSIS — I5020 Unspecified systolic (congestive) heart failure: Secondary | ICD-10-CM | POA: Diagnosis not present

## 2023-11-07 DIAGNOSIS — I10 Essential (primary) hypertension: Secondary | ICD-10-CM

## 2023-11-07 DIAGNOSIS — J439 Emphysema, unspecified: Secondary | ICD-10-CM

## 2023-11-07 DIAGNOSIS — Z720 Tobacco use: Secondary | ICD-10-CM | POA: Insufficient documentation

## 2023-11-07 DIAGNOSIS — I255 Ischemic cardiomyopathy: Secondary | ICD-10-CM | POA: Insufficient documentation

## 2023-11-07 DIAGNOSIS — E785 Hyperlipidemia, unspecified: Secondary | ICD-10-CM | POA: Insufficient documentation

## 2023-11-07 DIAGNOSIS — Z79899 Other long term (current) drug therapy: Secondary | ICD-10-CM

## 2023-11-07 MED ORDER — EPLERENONE 25 MG PO TABS: 25.0000 mg | ORAL_TABLET | Freq: Every day | ORAL | 1 refills | Status: AC

## 2023-11-07 NOTE — Patient Instructions (Addendum)
 Medication Instructions:  STOP TAKING SPIRONOLACTONE .   START TAKING EPLERENONE 25 MG DAILY.  Lab Work: CMET AND FASTING LIPID PANEL TO BE DONE IN 2 WEEKS.  Testing/Procedures: NONE  Follow-Up: At [facility], you and your health needs are our priority.  As part of our continuing mission to provide you with exceptional heart care, our providers are all part of one team.  This team includes your primary Cardiologist (physician) and Advanced Practice Providers or APPs (Physician Assistants and Nurse Practitioners) who all work together to provide you with the care you need, when you need it.  Your next appointment:   4 MONTHS  Provider:   MADISON FOUNTAIN, NP

## 2023-11-07 NOTE — Progress Notes (Signed)
 Cardiology Office Note:    Date:  11/07/2023  ID:  Reginald [name], DOB Dec 01, 1965, MRN [id number] PCP: Anita Bernardino BROCKS, FNP  [facility] Providers Cardiologist:  Lurena MARLA Red, MD Cardiology APP:  Rana Lum LITTIE, NP       Patient Profile:       Chief Complaint: 52-month follow-up History of Present Illness:  Reginald [name] is a 58 y.o. male with visit-pertinent history of coronary artery disease s/p CABG x 3 on 05/31/2023, tobacco abuse, pulmonary nodule, HFrEF, GERD, migraines, IBS, arthritis   He was admitted 05/28/2023 with unstable angina.  Echocardiogram 05/29/2023 showed LVEF 35 to 40%, grade 1 DD, RWMA, no valvular abnormalities.  Cardiac catheterization 05/29/2023 showed three-vessel obstructive CAD.  He ultimately underwent 3V CABG on 5/9.  He did have a right-sided pneumothorax and discharged from hospital on 5/15 with pigtail catheter in place.  Repeat chest x-ray showed no pneumothorax and was removed on 06/12/2023.   After discharge from hospital he developed lower extremity edema and went to the ED on 06/08/2023.  He was given IV Lasix  but eloped from the hospital.  He called CT surgery and was prescribed Lasix  40 mg daily x 7 days.   He was seen for follow-up by [facility] on 06/14/2023.  Notes he was feeling much better and denied any acute complaints.  His spironolactone  was increased to 25 mg daily.  He was continued on Lasix  40 mg daily, Toprol -XL 50 mg daily, losartan  25 mg daily, Jardiance  10 mg daily.  Last seen in clinic on 07/05/2023.  Doing well without acute cardiovascular complaints.  Denies prior anginal equivalent.  Reported baseline shortness of breath that had improved slightly.  He remained euvolemic and well compensated.  Unable to further optimize GDMT given his low normal BP.  He is continued on Jardiance , furosemide , losartan , metoprolol , spironolactone .  Echocardiogram 09/27/2023 showed LVEF 55 to 60%, no RWMA, normal diastolic parameters, RV function and  size normal, no valvular abnormalities.   Discussed the use of AI scribe software for clinical note transcription with the patient, who gave verbal consent to proceed.  History of Present Illness Reginald [name] is a 58 year old male who presents for follow-up.  Today he is doing well without acute cardiovascular concerns.  He has completed his cardiac rehab without any exertional symptoms.  Doing well and notes this is best he has felt in quite some time.  He remains adherent to his medication regimen and has returned back to work.  His blood pressure is generally well-controlled at home, staying below 130/80 mmHg.   He experiences nipple and breast tenderness since he was discharged from the hospital.  The tenderness is bothersome when putting on shirts but subsides after wearing them for a while.  He does report shortness of breath remains stable.  He is interested with establishing with a pulmonologist at home.  No orthopnea, PND, LEE, syncope, presyncope, lightheadedness, or dizziness   Review of systems:  Please see the history of present illness. All other systems are reviewed and otherwise negative.      Studies Reviewed:        Echocardiogram 09/27/2023  1. Left ventricular ejection fraction, by estimation, is 55 to 60%. The  left ventricle has normal function. The left ventricle has no regional  wall motion abnormalities. Left ventricular diastolic parameters were  normal. The average left ventricular  global longitudinal strain is -10.1 %. The global longitudinal strain is  abnormal.  2. Right ventricular systolic function is normal. The right ventricular  size is normal. Tricuspid regurgitation signal is inadequate for assessing  PA pressure.   3. The mitral valve is normal in structure. No evidence of mitral valve  regurgitation. No evidence of mitral stenosis.   4. The aortic valve is normal in structure. Aortic valve regurgitation is  not visualized. No aortic  stenosis is present.   5. The inferior vena cava is normal in size with greater than 50%  respiratory variability, suggesting right atrial pressure of 3 mmHg.    Echocardiogram 05/29/2023 1. Left ventricular ejection fraction, by estimation, is 35 to 40%. The  left ventricle has moderately decreased function. The left ventricle  demonstrates regional wall motion abnormalities (see scoring  diagram/findings for description). Left ventricular   diastolic parameters are consistent with Grade I diastolic dysfunction  (impaired relaxation).   2. Right ventricular systolic function is normal. The right ventricular  size is normal. Tricuspid regurgitation signal is inadequate for assessing  PA pressure.   3. The mitral valve is normal in structure. No evidence of mitral valve  regurgitation. No evidence of mitral stenosis.   4. The aortic valve is normal in structure. Aortic valve regurgitation is  not visualized. No aortic stenosis is present.   5. The inferior vena cava is normal in size with greater than 50%  respiratory variability, suggesting right atrial pressure of 3 mmHg.    Cardiac catheterization 05/29/2023   Ost LAD to Prox LAD lesion is 70% stenosed.   Mid LAD lesion is 80% stenosed.   1st Mrg lesion is 95% stenosed.   Prox Cx to Mid Cx lesion is 60% stenosed.   Prox RCA to Mid RCA lesion is 90% stenosed.   LV end diastolic pressure is mildly elevated.   3 vessel obstructive CAD Mildly elevated LVEDP Diagnostic Dominance: Right   Risk Assessment/Calculations:              Physical Exam:   VS:  BP 136/70   Pulse 72   Ht 5' 8 (1.727 m)   Wt 179 lb (81.2 kg)   SpO2 95%   BMI 27.22 kg/m    Wt Readings from Last 3 Encounters:  11/07/23 179 lb (81.2 kg)  10/17/23 177 lb 12.8 oz (80.6 kg)  10/16/23 177 lb 11.1 oz (80.6 kg)    GEN: Well nourished, well developed in no acute distress NECK: No JVD; No carotid bruits CARDIAC: RRR, no murmurs, rubs, gallops RESPIRATORY:   Clear to auscultation without rales, wheezing or rhonchi  ABDOMEN: Soft, non-tender, non-distended EXTREMITIES:  No edema; No acute deformity      Assessment and Plan:  Coronary artery disease s/p CABG LHC 05/2023 with three-vessel obstructive CAD and underwent 3V CABG on 05/31/2023 - Today he is stable without chest pains.  No indication for further ischemic evaluation at this time - Completed cardiac rehab and experienced no exertional symptoms.  Has returned to work and notes improved exercise tolerance - Continue aspirin  81 mg daily, clopidogrel  75 mg daily, metoprolol  XL 50 mg daily, rosuvastatin  20 mg daily   HFimpEF Ischemic cardiomyopathy Echocardiogram 05/2023 with LVEF 35-40% Echocardiogram 09/2023 with improved LVEF of 55 to 60%, no RWMA Most recent echocardiogram shows improvement in EF - Today patient appears euvolemic and well compensated on exam - NYHA class II due to chronic dyspnea - Remains without orthopnea, PND, LEE - Experiencing breast tenderness that has become bothersome - Plan to switch spironolactone  to eplerenone 25  mg daily - Continue GDMT Jardiance  10 mg daily, furosemide  40 mg daily, losartan  25 mg daily, Toprol -XL 50 mg daily, eplerenone 25 mg day - BMET today x 2 weeks   Hyperlipidemia, goal LDL <55 LDL 162 on 05/2023  LDL 93 on 09/2023 and rosuvastatin  subsequently increased to 40 mg - Plan for repeat lipid panel and LFTs x 2 weeks - Continue rosuvastatin  40 mg daily - Can consider adding ezetimibe to her regimen if unable to reach goal - Encouraged heart healthy dieting   Tobacco abuse Pulmonary nodule Severe emphysema No recent exacerbation Pt w/ >60pack years of tobacco use No longer smoking cigarettes, quit 05/28/2023 - PET 07/2023 showed right apical spiculated nodule with pleural thickening and scarring that is stable without abnormal uptake.  Currently pending CT chest on 10/2023 for follow-up - He is interested in establishing with pulmonology  for his emphysema and lung nodule.  I will refer to pulmonology today - Congratulated on tobacco cessation   Hypertension Blood pressure today is 136/70 - Continue HF GDMT - Maintain home BP log  Right wrist pain w/ radial artery occlusion - Management per [facility]      Dispo:  Return in about 4 months (around 03/09/2024).  Signed, Lum LITTIE Louis, NP

## 2023-11-08 ENCOUNTER — Encounter (HOSPITAL_COMMUNITY)

## 2023-11-09 ENCOUNTER — Other Ambulatory Visit (INDEPENDENT_AMBULATORY_CARE_PROVIDER_SITE_OTHER): Payer: Self-pay | Admitting: Gastroenterology

## 2023-11-11 ENCOUNTER — Other Ambulatory Visit (INDEPENDENT_AMBULATORY_CARE_PROVIDER_SITE_OTHER): Payer: Self-pay | Admitting: Gastroenterology

## 2023-11-11 ENCOUNTER — Encounter (HOSPITAL_COMMUNITY)

## 2023-11-11 DIAGNOSIS — K529 Noninfective gastroenteritis and colitis, unspecified: Secondary | ICD-10-CM

## 2023-11-12 ENCOUNTER — Ambulatory Visit (HOSPITAL_COMMUNITY)
Admission: RE | Admit: 2023-11-12 | Discharge: 2023-11-12 | Disposition: A | Discharge status: Home / Self Care | Source: Ambulatory Visit | Attending: Cardiology | Admitting: Cardiology

## 2023-11-12 DIAGNOSIS — R911 Solitary pulmonary nodule: Secondary | ICD-10-CM | POA: Diagnosis present

## 2023-11-13 ENCOUNTER — Encounter (HOSPITAL_COMMUNITY)

## 2023-11-15 ENCOUNTER — Encounter (HOSPITAL_COMMUNITY)

## 2023-11-18 ENCOUNTER — Other Ambulatory Visit (INDEPENDENT_AMBULATORY_CARE_PROVIDER_SITE_OTHER): Payer: Self-pay | Admitting: Gastroenterology

## 2023-11-18 ENCOUNTER — Encounter (HOSPITAL_COMMUNITY)

## 2023-11-19 ENCOUNTER — Encounter: Payer: Self-pay | Admitting: Thoracic Surgery (Cardiothoracic Vascular Surgery)

## 2023-11-19 ENCOUNTER — Ambulatory Visit
Attending: Thoracic Surgery (Cardiothoracic Vascular Surgery) | Admitting: Thoracic Surgery (Cardiothoracic Vascular Surgery)

## 2023-11-19 VITALS — BP 154/84 | HR 67 | Resp 20 | Wt 181.2 lb

## 2023-11-19 DIAGNOSIS — R911 Solitary pulmonary nodule: Secondary | ICD-10-CM | POA: Diagnosis not present

## 2023-11-19 NOTE — Progress Notes (Signed)
 [address], [place] [address]             [phone]     HPI: Mr. Woodham returns for follow-up of a right upper lobe opacity.  Keary Waterson is a 58 year old man with a history of tobacco use, COPD, lung nodule, reflux, irritable bowel syndrome, CAD, ischemic cardiomyopathy, and coronary bypass grafting.  He presented with chest pain and was found to have severe three-vessel disease with ejection fraction of 35 to 40%.  He underwent coronary bypass X 3 on 05/31/2023.  Primary issue postoperatively was right wrist pain and he had a right radial occlusion and pseudoaneurysm.  Did not require intervention.  Workup also revealed a 1 cm right upper lobe lung nodule in an area of scarring.  PET/CT did not show activity in the nodule.  He quit smoking.  He now returns for follow-up of the right upper lobe lung nodule.  Patient Active Problem List   Diagnosis Date Noted   Coronary artery disease involving native coronary artery of native heart without angina pectoris 11/07/2023   Heart failure with improved ejection fraction (HFimpEF) (HCC) 11/07/2023   Ischemic cardiomyopathy 11/07/2023   Tobacco abuse 11/07/2023   Hyperlipidemia LDL goal <55 11/07/2023   S/P CABG (coronary artery bypass graft) 05/31/2023   Unstable angina (HCC) 05/28/2023   Pulmonary nodule 05/28/2023   Essential hypertension 05/28/2023   IBS (irritable bowel syndrome) 05/28/2023   Melena 04/01/2023   Abdominal pain, epigastric 04/01/2023   Gastroesophageal reflux disease 01/01/2023   Helicobacter pylori gastritis 12/07/2021   Nausea and vomiting 09/04/2021   Chronic diarrhea 09/04/2021     Current Outpatient Medications  Medication Sig Dispense Refill   aspirin  EC 81 MG tablet Take 1 tablet (81 mg total) by mouth daily. Swallow whole.     clopidogrel  (PLAVIX ) 75 MG tablet Take 1 tablet (75 mg total) by mouth daily. 30 tablet 1   dicyclomine  (BENTYL ) 10 MG capsule TAKE 1 CAPSULE (10 MG  TOTAL) BY MOUTH EVERY 12 (TWELVE) HOURS AS NEEDED (ABDOMINAL PAIN). 180 capsule 1   diphenoxylate -atropine  (LOMOTIL ) 2.5-0.025 MG tablet TAKE 1 TABLET BY MOUTH 4 (FOUR) TIMES DAILY AS NEEDED FOR DIARRHEA OR LOOSE STOOLS. 120 tablet 1   empagliflozin  (JARDIANCE ) 10 MG TABS tablet Take 1 tablet (10 mg total) by mouth daily. 30 tablet 1   eplerenone (INSPRA) 25 MG tablet Take 1 tablet (25 mg total) by mouth daily. 90 tablet 1   furosemide  (LASIX ) 40 MG tablet Take 1 tablet (40 mg total) by mouth daily. 30 tablet 3   gabapentin  (NEURONTIN ) 300 MG capsule Take 1 capsule (300 mg total) by mouth 2 (two) times daily. 60 capsule 0   losartan  (COZAAR ) 25 MG tablet Take 1 tablet (25 mg total) by mouth daily. 30 tablet 1   MAGNESIUM  PO Take 1 tablet by mouth at bedtime.     metoprolol  succinate (TOPROL -XL) 50 MG 24 hr tablet Take 1 tablet (50 mg total) by mouth at bedtime. Take with or immediately following a meal. 30 tablet 1   Multiple Vitamin (MULTIVITAMIN) tablet Take 1 tablet by mouth at bedtime.     ondansetron  (ZOFRAN ) 4 MG tablet TAKE 1 TABLET BY MOUTH EVERY 8 HOURS AS NEEDED FOR NAUSEA AND VOMITING 90 tablet 3   oxyCODONE  (OXY IR/ROXICODONE ) 5 MG immediate release tablet Take 1 tablet (5 mg total) by mouth every 6 (six) hours as needed for severe pain (pain score 7-10). 56  tablet 0   pantoprazole  (PROTONIX ) 40 MG tablet Take 40 mg by mouth 2 (two) times daily.     potassium chloride  SA (KLOR-CON  M) 20 MEQ tablet Take 1 tablet (20 mEq total) by mouth daily. 90 tablet 1   promethazine  (PHENERGAN ) 12.5 MG tablet TAKE 1 TABLET BY MOUTH EVERY 6 HOURS AS NEEDED FOR NAUSEA OR VOMITING. 30 tablet 0   rosuvastatin  (CRESTOR ) 40 MG tablet Take 1 tablet (40 mg total) by mouth daily. 90 tablet 2   sucralfate  (CARAFATE ) 1 g tablet Take 1 tablet (1 g total) by mouth 4 (four) times daily -  with meals and at bedtime. Dissolve tablet in 30ml of water, then drink slurry 120 tablet 1   No current facility-administered  medications for this visit.    Physical Exam BP (!) 154/84 (BP Location: Right Arm, Patient Position: Sitting, Cuff Size: Normal)   Pulse 67   Resp 20   Wt 181 lb 3.2 oz (82.2 kg)   SpO2 97% Comment: RA  BMI 27.55 kg/m  Well-appearing 58 year old man in no acute distress Alert and oriented x 3 with no focal deficits Lungs clear with equal breath sounds bilaterally Cardiac regular rate and rhythm  Diagnostic Tests: CT CHEST WITHOUT CONTRAST   TECHNIQUE: Multidetector CT imaging of the chest was performed following the standard protocol without IV contrast.   RADIATION DOSE REDUCTION: This exam was performed according to the departmental dose-optimization program which includes automated exposure control, adjustment of the mA and/or kV according to patient size and/or use of iterative reconstruction technique.   COMPARISON:  CT chest 05/28/2023, PET-CT 08/16/2023   FINDINGS: Cardiovascular: Median sternotomy wires are present. Heart is normal size. Evidence of previous CABG. Thoracic aorta is normal in caliber. Remaining vascular structures are unremarkable.   Mediastinum/Nodes: No mediastinal or hilar adenopathy. Remaining mediastinal structures are unremarkable.   Lungs/Pleura: Lungs are adequately inflated demonstrate moderate centrilobular emphysematous disease. Biapical scarring is present. 5 x 10 mm nodule over the right apex unchanged and not hypermetabolic on prior PET-CT. No acute airspace process or effusion. Tiny 2 mm peripheral nodule over the lateral left lower lobe unchanged. 2 mm nodule over the lingula (image 65) not seen previously. Airways are normal.   Upper Abdomen: Calcified plaque over the visualized abdominal aorta. No acute findings in the upper abdomen.   Musculoskeletal: No focal abnormality.   IMPRESSION: 1. No acute cardiopulmonary disease. 2. Stable 5 x 10 mm nodule over the right apex unchanged and not hypermetabolic on prior PET-CT.  Recommend an additional follow-up chest CT in 18 months to document 2 years of stability. This recommendation follows the consensus statement: Guidelines for Management of Small Pulmonary Nodules Detected on CT Scans: A Statement from the Fleischner Society as published in Radiology 2005; 237:395-400. Online at: Dietdisorder.cz. 3. 2 mm nodule over the lingula not seen previously. Recommend attention on follow-up. 4. Emphysema. 5. Aortic atherosclerosis. Atherosclerotic coronary artery disease with evidence of previous CABG.   Aortic Atherosclerosis (ICD10-I70.0) and Emphysema (ICD10-J43.9).     Electronically Signed   By: Toribio Agreste M.D.   On: 11/12/2023 09:27 I personally reviewed the CT images.  No change in the 5 x 10 mm nodular area in the right apex.  Other tiny 2 mm nodules.  Aortic and coronary atherosclerosis.  Previous CABG.  Impression: Strider Vallance is a 58 year old man with a history of tobacco use, COPD, lung nodule, reflux, irritable bowel syndrome, CAD, ischemic cardiomyopathy, and coronary bypass grafting.  Three-vessel coronary  disease with impaired left ventricular function-6 months out from CABG with no recurrent angina.  Right upper lobe nodular opacity-appears more nodular on the axial images but more like scar on the coronal and sagittal's.  Unchanged from prior CT.  Will plan a repeat CT in 6 months.  Will need to follow-up for 2 years to ensure stability.  He had questions regarding upcoming dental appointment.  He does not have valvular disease so there is no need for prophylactic antibiotics.  If he needs to hold his Plavix  for 5 days prior that would be fine.  Plan: Return in 6 months with CT chest  Elspeth JAYSON Millers, MD [facility] [phone]

## 2023-11-20 ENCOUNTER — Encounter (HOSPITAL_COMMUNITY)

## 2023-11-20 LAB — COMPREHENSIVE METABOLIC PANEL WITH GFR
ALT: 23 IU/L (ref 0–44)
AST: 26 IU/L (ref 0–40)
Albumin: 4.2 g/dL (ref 3.8–4.9)
Alkaline Phosphatase: 101 IU/L (ref 47–123)
BUN/Creatinine Ratio: 12 (ref 9–20)
BUN: 10 mg/dL (ref 6–24)
Bilirubin Total: 0.3 mg/dL (ref 0.0–1.2)
CO2: 24 mmol/L (ref 20–29)
Calcium: 9 mg/dL (ref 8.7–10.2)
Chloride: 104 mmol/L (ref 96–106)
Creatinine, Ser: 0.86 mg/dL (ref 0.76–1.27)
Globulin, Total: 3 g/dL (ref 1.5–4.5)
Glucose: 94 mg/dL (ref 70–99)
Potassium: 4.9 mmol/L (ref 3.5–5.2)
Sodium: 141 mmol/L (ref 134–144)
Total Protein: 7.2 g/dL (ref 6.0–8.5)
eGFR: 100 mL/min/1.73 (ref >59)

## 2023-11-20 LAB — LIPID PANEL
Chol/HDL Ratio: 5.2 ratio — ABNORMAL HIGH (ref 0.0–5.0)
Cholesterol, Total: 155 mg/dL (ref 100–199)
HDL: 30 mg/dL — ABNORMAL LOW (ref >39)
LDL Chol Calc (NIH): 89 mg/dL (ref 0–99)
Triglycerides: 211 mg/dL — ABNORMAL HIGH (ref 0–149)
VLDL Cholesterol Cal: 36 mg/dL (ref 5–40)

## 2023-11-22 ENCOUNTER — Other Ambulatory Visit: Payer: Self-pay | Admitting: *Deleted

## 2023-11-22 ENCOUNTER — Ambulatory Visit: Payer: Self-pay | Admitting: Emergency Medicine

## 2023-11-22 ENCOUNTER — Encounter (HOSPITAL_COMMUNITY)

## 2023-11-22 DIAGNOSIS — Z79899 Other long term (current) drug therapy: Secondary | ICD-10-CM

## 2023-11-22 DIAGNOSIS — R0989 Other specified symptoms and signs involving the circulatory and respiratory systems: Secondary | ICD-10-CM

## 2023-11-22 MED ORDER — EZETIMIBE 10 MG PO TABS
10.0000 mg | ORAL_TABLET | Freq: Every day | ORAL | 3 refills | Status: AC
Start: 2023-11-22 — End: 2024-02-20

## 2023-11-22 NOTE — Telephone Encounter (Signed)
 Pt returning call to nurse

## 2023-11-25 ENCOUNTER — Encounter (HOSPITAL_COMMUNITY)

## 2023-11-27 ENCOUNTER — Other Ambulatory Visit (INDEPENDENT_AMBULATORY_CARE_PROVIDER_SITE_OTHER): Payer: Self-pay | Admitting: Gastroenterology

## 2023-11-27 ENCOUNTER — Encounter (HOSPITAL_COMMUNITY)

## 2023-11-27 DIAGNOSIS — R1010 Upper abdominal pain, unspecified: Secondary | ICD-10-CM

## 2023-11-27 NOTE — Progress Notes (Unsigned)
 Office Note   HPI: Reginald [name] is a 58 y.o. (Aug 27, 1965) male presenting at the request of Dr. Heide for nonpalpable pulses in the feet.  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  Imaging demonstrated an intact palmar arch.  He was complaining of some numbness and tingling in the hand, and therefore I sent him to hand surgery for carpal tunnel workup.    ABI at the time of CABG was relatively normal with adequate toe pressure for wound healing.  On exam today, Reginald [name] was doing well, accompanied by his wife.  He has been having trouble with claudication symptoms, stating that he can walk for some time prior to bilateral lower extremity pain which requires him to sit down.  Once the pain resolves, he can walk again.  This is accompanied with some numbness and tingling.  From an upper extremity standpoint, he was diagnosed with carpal tunnel syndrome, and he has been wearing a brace on the right wrist, which has helped his symptoms dramatically.   The pt is  on a statin for cholesterol management.  The pt is  on a daily aspirin .   Other AC:  plavix  Tobacco hx:  former  Past Medical History:  Diagnosis Date   Arthritis    GERD (gastroesophageal reflux disease)    Headache    migraines    IBS (irritable bowel syndrome)     Past Surgical History:  Procedure Laterality Date   BIOPSY  11/17/2021   Procedure: BIOPSY;  Surgeon: Eartha Angelia Sieving, MD;  Location: [facility];  Service: Gastroenterology;;   COLONOSCOPY WITH PROPOFOL  N/A 11/17/2021   Procedure: COLONOSCOPY WITH PROPOFOL ;  Surgeon: Eartha Angelia Sieving, MD;  Location: [facility];  Service: Gastroenterology;  Laterality: N/A;  200 ASA 2   CORONARY ARTERY BYPASS GRAFT N/A 05/31/2023   Procedure: CORONARY ARTERY BYPASS GRAFTING (CABG) TIMES THREE USING LEFT INTERNAL MAMMARY ARTERY AND OPENLY HARVESTED RIGHT AND LEFT GREATER SAPHENOUS VEIN;   Surgeon: Kerrin Elspeth BROCKS, MD;  Location: [facility];  Service: Open Heart Surgery;  Laterality: N/A;   ESOPHAGOGASTRODUODENOSCOPY N/A 04/24/2023   Procedure: EGD (ESOPHAGOGASTRODUODENOSCOPY);  Surgeon: Eartha Angelia, Sieving, MD;  Location: [facility];  Service: Gastroenterology;  Laterality: N/A;  9:15AM;ASA 2   ESOPHAGOGASTRODUODENOSCOPY (EGD) WITH PROPOFOL  N/A 11/17/2021   Procedure: ESOPHAGOGASTRODUODENOSCOPY (EGD) WITH PROPOFOL ;  Surgeon: Eartha Angelia Sieving, MD;  Location: [facility];  Service: Gastroenterology;  Laterality: N/A;   INGUINAL HERNIA REPAIR Right 06/02/2015   Procedure: RIGHT INGUINAL HERNIA REPAIR WITH MESH;  Surgeon: Camellia Blush, MD;  Location: [facility] OR;  Service: General;  Laterality: Right;   INSERTION OF MESH Right 06/02/2015   Procedure: INSERTION OF MESH;  Surgeon: Camellia Blush, MD;  Location: [facility] OR;  Service: General;  Laterality: Right;   INTRAOPERATIVE TRANSESOPHAGEAL ECHOCARDIOGRAM N/A 05/31/2023   Procedure: ECHOCARDIOGRAM, TRANSESOPHAGEAL, INTRAOPERATIVE;  Surgeon: Kerrin Elspeth BROCKS, MD;  Location: [facility] OR;  Service: Open Heart Surgery;  Laterality: N/A;   LEFT HEART CATH AND CORONARY ANGIOGRAPHY N/A 05/29/2023   Procedure: LEFT HEART CATH AND CORONARY ANGIOGRAPHY;  Surgeon: Jordan, Peter M, MD;  Location: [facility] INVASIVE CV LAB;  Service: Cardiovascular;  Laterality: N/A;   NO PAST SURGERIES     POLYPECTOMY  11/17/2021   Procedure: POLYPECTOMY;  Surgeon: Eartha Angelia Sieving, MD;  Location: [facility];  Service: Gastroenterology;;    Social History   Socioeconomic History  Marital status: Significant Other    Spouse name: Reginald [name]   Number of children: 2   Years of education: Not on file   Highest education level: GED or equivalent  Occupational History   Not on file  Tobacco Use   Smoking status: Former    Types: Cigarettes    Passive exposure: Current   Smokeless tobacco: Former  Building Services Engineer status: Never Used  Substance and Sexual  Activity   Alcohol use: No    Alcohol/week: 0.0 standard drinks of alcohol    Comment: formerly a heavy drinker, quit 2002   Drug use: No   Sexual activity: Not on file  Other Topics Concern   Not on file  Social History Narrative   Not on file   Social Drivers of Health   Financial Resource Strain: Low Risk  (06/05/2023)   Overall Financial Resource Strain (CARDIA)    Difficulty of Paying Living Expenses: Not very hard  Food Insecurity: Low Risk  (11/12/2023)   Received from [facility]   Hunger Vital Sign    Within the past 12 months, you worried that your food would run out before you got money to buy more: Never true    Within the past 12 months, the food you bought just didn't last and you didn't have money to get more. : Never true  Transportation Needs: No Transportation Needs (11/12/2023)   Received from [facility]    In the past 12 months, has lack of reliable transportation kept you from medical appointments, meetings, work or from getting things needed for daily living? : No  Physical Activity: Sufficiently Active (03/20/2023)   Received from [facility]   Exercise Vital Sign    On average, how many days per week do you engage in moderate to strenuous exercise (like a brisk walk)?: 7 days    On average, how many minutes do you engage in exercise at this level?: 30 min  Stress: No Stress Concern Present (03/20/2023)   Received from [facility] - Occupational Stress Questionnaire    Feeling of Stress : Only a little  Social Connections: Moderately Integrated (03/20/2023)   Received from [facility]   Social Connection and Isolation Panel    In a typical week, how many times do you talk on the phone with family, friends, or neighbors?: More than three times a week    How often do you get together with friends or relatives?: Never    How often do you attend church or religious services?: More than 4  times per year    Do you belong to any clubs or organizations such as church groups, unions, fraternal or athletic groups, or school groups?: No    How often do you attend meetings of the clubs or organizations you belong to?: Never    Are you married, widowed, divorced, separated, never married, or living with a partner?: Living with partner  Intimate Partner Violence: Not At Risk (05/28/2023)   Humiliation, Afraid, Rape, and Kick questionnaire    Fear of Current or Ex-Partner: No    Emotionally Abused: No    Physically Abused: No    Sexually Abused: No   Family History  Problem Relation Age of Onset   Diabetes Mother    Osteoarthritis Mother    Heart disease Father     Current Outpatient Medications  Medication Sig Dispense Refill   aspirin   EC 81 MG tablet Take 1 tablet (81 mg total) by mouth daily. Swallow whole.     clopidogrel  (PLAVIX ) 75 MG tablet Take 1 tablet (75 mg total) by mouth daily. 30 tablet 1   dicyclomine  (BENTYL ) 10 MG capsule TAKE 1 CAPSULE BY MOUTH EVERY 12 HOURS AS NEEDED (ABDOMINAL PAIN). 180 capsule 1   diphenoxylate -atropine  (LOMOTIL ) 2.5-0.025 MG tablet TAKE 1 TABLET BY MOUTH 4 (FOUR) TIMES DAILY AS NEEDED FOR DIARRHEA OR LOOSE STOOLS. 120 tablet 1   empagliflozin  (JARDIANCE ) 10 MG TABS tablet Take 1 tablet (10 mg total) by mouth daily. 30 tablet 1   eplerenone (INSPRA) 25 MG tablet Take 1 tablet (25 mg total) by mouth daily. 90 tablet 1   ezetimibe (ZETIA) 10 MG tablet Take 1 tablet (10 mg total) by mouth daily. 90 tablet 3   furosemide  (LASIX ) 40 MG tablet Take 1 tablet (40 mg total) by mouth daily. 30 tablet 3   gabapentin  (NEURONTIN ) 300 MG capsule Take 1 capsule (300 mg total) by mouth 2 (two) times daily. 60 capsule 0   losartan  (COZAAR ) 25 MG tablet Take 1 tablet (25 mg total) by mouth daily. 30 tablet 1   MAGNESIUM  PO Take 1 tablet by mouth at bedtime.     metoprolol  succinate (TOPROL -XL) 50 MG 24 hr tablet Take 1 tablet (50 mg total) by mouth at bedtime.  Take with or immediately following a meal. 30 tablet 1   Multiple Vitamin (MULTIVITAMIN) tablet Take 1 tablet by mouth at bedtime.     ondansetron  (ZOFRAN ) 4 MG tablet TAKE 1 TABLET BY MOUTH EVERY 8 HOURS AS NEEDED FOR NAUSEA AND VOMITING 90 tablet 3   oxyCODONE  (OXY IR/ROXICODONE ) 5 MG immediate release tablet Take 1 tablet (5 mg total) by mouth every 6 (six) hours as needed for severe pain (pain score 7-10). 56 tablet 0   pantoprazole  (PROTONIX ) 40 MG tablet Take 40 mg by mouth 2 (two) times daily.     potassium chloride  SA (KLOR-CON  M) 20 MEQ tablet Take 1 tablet (20 mEq total) by mouth daily. 90 tablet 1   promethazine  (PHENERGAN ) 12.5 MG tablet TAKE 1 TABLET BY MOUTH EVERY 6 HOURS AS NEEDED FOR NAUSEA OR VOMITING. 30 tablet 0   rosuvastatin  (CRESTOR ) 40 MG tablet Take 1 tablet (40 mg total) by mouth daily. 90 tablet 2   sucralfate  (CARAFATE ) 1 g tablet Take 1 tablet (1 g total) by mouth 4 (four) times daily -  with meals and at bedtime. Dissolve tablet in 30ml of water, then drink slurry 120 tablet 1   No current facility-administered medications for this visit.    Allergies  Allergen Reactions   Penicillins Rash    PCN: Immediate rash, facial/tongue/throat swelling, SOB or lightheadedness with hypotension.   (He has received Ceftriaxone  IM on 06/10/2017 without adverse reaction /none reported).      REVIEW OF SYSTEMS:  [X]  denotes positive finding, [ ]  denotes negative finding Cardiac  Comments:  Chest pain or chest pressure:    Shortness of breath upon exertion:    Short of breath when lying flat:    Irregular heart rhythm:        Vascular    Pain in calf, thigh, or hip brought on by ambulation:    Pain in feet at night that wakes you up from your sleep:     Blood clot in your veins:    Leg swelling:         Pulmonary    Oxygen at  home:    Productive cough:     Wheezing:         Neurologic    Sudden weakness in arms or legs:     Sudden numbness in arms or legs:      Sudden onset of difficulty speaking or slurred speech:    Temporary loss of vision in one eye:     Problems with dizziness:         Gastrointestinal    Blood in stool:     Vomited blood:         Genitourinary    Burning when urinating:     Blood in urine:        Psychiatric    Major depression:         Hematologic    Bleeding problems:    Problems with blood clotting too easily:        Skin    Rashes or ulcers:        Constitutional    Fever or chills:      PHYSICAL EXAMINATION:  There were no vitals filed for this visit.  General:  WDWN in NAD; vital signs documented above Gait: Not observed HENT: WNL, normocephalic Pulmonary: normal  Cardiac: regular HR Abdomen: soft, NT, no masses Skin: without rashes Vascular Exam/Pulses:  Right Left  Radial none 2+ (normal)  Ulnar 2+ (normal) 2+ (normal)  Femoral    Popliteal    DP    PT 2+ 2+   Extremities: without ischemic changes, without Gangrene , without cellulitis; without open wounds;  Musculoskeletal: no muscle wasting or atrophy  Neurologic: A&O X 3;  No focal weakness or paresthesias are detected Psychiatric:  The pt has Normal affect.   Non-Invasive Vascular Imaging:    ABI Findings:  +---------+------------------+-----+--------+--------+  Right   Rt Pressure (mmHg)IndexWaveformComment   +---------+------------------+-----+--------+--------+  Brachial 126                                      +---------+------------------+-----+--------+--------+  PTA     125               0.99 biphasic          +---------+------------------+-----+--------+--------+  DP      118               0.94 biphasic          +---------+------------------+-----+--------+--------+  Great Toe66                0.52 Abnormal          +---------+------------------+-----+--------+--------+   +---------+------------------+-----+--------+-------+  Left    Lt Pressure (mmHg)IndexWaveformComment   +---------+------------------+-----+--------+-------+  Brachial 121                                     +---------+------------------+-----+--------+-------+  PTA     125               0.99 biphasic         +---------+------------------+-----+--------+-------+  DP      121               0.96 biphasic         +---------+------------------+-----+--------+-------+  Great Toe97                0.77 Normal           +---------+------------------+-----+--------+-------+  ASSESSMENT/PLAN: Reginald [name] is a 58 y.o. male presenting for lower extremity PAD workup.  On physical exam, he had palpable posterior tibial pulses. ABIs reviewed, and found to be normal.  He has acceptable toe pressures bilaterally.  I do not think his claudication symptoms are related to a peripheral arterial disease.  I am happy that his right upper extremity symptoms have improved significantly with wearing the carpal tunnel brace.  I am hopeful that the completely resolved with time.  I asked him to continue his current medication regimen and call should any questions or concerns arise.   Fonda FORBES Rim, MD [facility] [phone]

## 2023-11-28 ENCOUNTER — Ambulatory Visit (HOSPITAL_COMMUNITY)
Admission: RE | Admit: 2023-11-28 | Discharge: 2023-11-28 | Disposition: A | Discharge status: Home / Self Care | Source: Ambulatory Visit | Attending: Vascular Surgery | Admitting: Vascular Surgery

## 2023-11-28 ENCOUNTER — Encounter: Payer: Self-pay | Admitting: Vascular Surgery

## 2023-11-28 ENCOUNTER — Ambulatory Visit (INDEPENDENT_AMBULATORY_CARE_PROVIDER_SITE_OTHER): Admitting: Vascular Surgery

## 2023-11-28 VITALS — BP 123/82 | HR 86 | Temp 98.3°F | Resp 16 | Ht 68.0 in | Wt 180.6 lb

## 2023-11-28 DIAGNOSIS — M48062 Spinal stenosis, lumbar region with neurogenic claudication: Secondary | ICD-10-CM | POA: Diagnosis not present

## 2023-11-28 DIAGNOSIS — R0989 Other specified symptoms and signs involving the circulatory and respiratory systems: Secondary | ICD-10-CM | POA: Diagnosis not present

## 2023-12-12 ENCOUNTER — Ambulatory Visit: Admitting: Emergency Medicine

## 2024-01-01 ENCOUNTER — Encounter (INDEPENDENT_AMBULATORY_CARE_PROVIDER_SITE_OTHER): Payer: Self-pay

## 2024-01-02 ENCOUNTER — Ambulatory Visit (INDEPENDENT_AMBULATORY_CARE_PROVIDER_SITE_OTHER): Payer: 59 | Admitting: Gastroenterology

## 2024-01-17 ENCOUNTER — Other Ambulatory Visit: Payer: Self-pay | Admitting: Orthopedic Surgery

## 2024-01-17 DIAGNOSIS — M545 Low back pain, unspecified: Secondary | ICD-10-CM

## 2024-01-22 DIAGNOSIS — K529 Noninfective gastroenteritis and colitis, unspecified: Secondary | ICD-10-CM

## 2024-01-28 ENCOUNTER — Inpatient Hospital Stay
Admission: RE | Admit: 2024-01-28 | Discharge: 2024-01-28 | Attending: Orthopedic Surgery | Admitting: Orthopedic Surgery

## 2024-01-28 DIAGNOSIS — M545 Low back pain, unspecified: Secondary | ICD-10-CM

## 2024-01-29 ENCOUNTER — Encounter: Payer: Self-pay | Admitting: Emergency Medicine

## 2024-01-29 ENCOUNTER — Telehealth: Payer: Self-pay

## 2024-01-29 ENCOUNTER — Ambulatory Visit (INDEPENDENT_AMBULATORY_CARE_PROVIDER_SITE_OTHER): Admitting: Emergency Medicine

## 2024-01-29 VITALS — BP 138/82 | HR 83 | Ht 68.0 in | Wt 185.0 lb

## 2024-01-29 DIAGNOSIS — R911 Solitary pulmonary nodule: Secondary | ICD-10-CM

## 2024-01-29 DIAGNOSIS — J449 Chronic obstructive pulmonary disease, unspecified: Secondary | ICD-10-CM

## 2024-01-29 MED ORDER — STIOLTO RESPIMAT 2.5-2.5 MCG/ACT IN AERS: INHALATION_SPRAY | RESPIRATORY_TRACT | Status: AC

## 2024-01-29 NOTE — Telephone Encounter (Signed)
 Pt presented in office for OV with Dr. Shelah Days is to expensive and insurance will not cover.  Pharmacy can you please advise cheaper formulary alternative for Breo.

## 2024-01-29 NOTE — Patient Instructions (Addendum)
" °  VISIT SUMMARY: You came in today for an evaluation of your COPD and a pulmonary nodule. You have a history of coronary artery disease and underwent coronary artery bypass grafting in May 2025. You have a history of former tobacco use and have been diagnosed with presumed COPD. You experience shortness of breath with exertion and use albuterol almost every day. A CT scan identified a 1 cm nodule in your right upper lung, which has remained stable and is PET negative. You also experience chronic back pain that worsens with exertion.  YOUR PLAN: -PULMONARY NODULE: A pulmonary nodule is a small, round growth in the lung. Your 1 cm nodule in the right upper lobe has remained stable and does not show signs of being cancerous. We will repeat a CT scan of your chest in October 2026 to monitor its stability. We will also coordinate with Dr. Kerrin for your imaging follow-up.  -CHRONIC OBSTRUCTIVE PULMONARY DISEASE (COPD) WITH EMPHYSEMA: COPD is a chronic lung disease that makes it hard to breathe, often caused by smoking. You have emphysema, a type of COPD, which is causing your shortness of breath. We have ordered pulmonary function tests to assess the severity of your COPD.  We will try starting Stiolto 2 puffs once daily to see if you get benefit.  If so we we will consider continuing this medication going forward. We will review the test results before finalizing your inhaler therapy and will coordinate with your insurance for coverage.  INSTRUCTIONS: We we will schedule CT chest scan in October 2026. Additionally, complete the pulmonary function tests as ordered.  Start Stiolto and keep track of how this medication helps you so we can discuss.  We will review the results and adjust your treatment plan in about 2 months. Coordinate with Dr. Kerrin for your imaging follow-up.    "

## 2024-01-29 NOTE — Progress Notes (Signed)
 "  Subjective:    Patient ID: Reginald [name], male    DOB: 21-Nov-1965, 59 y.o.   MRN: [id number]  HPI Discussed the use of AI scribe software for clinical note transcription with the patient, who gave verbal consent to proceed.  History of Present Illness Reginald [name] is a 59 year old male with coronary artery disease post-CABG and COPD who presents for evaluation of COPD and a pulmonary nodule. He was referred by Dr. Kerrin for evaluation of his history of tobacco use, COPD, and a pulmonary nodule on chest imaging.  He has a history of coronary artery disease and underwent coronary artery bypass grafting (CABG) in May 2025. He has a history of former tobacco use and has been diagnosed with presumed COPD of unclear severity. 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 He uses albuterol almost every day, several times a day, and previously used Breo until his insurance stopped covering it. He is unsure if Breo provided any benefit. He does not recall having pulmonary function testing done.  A CT angiogram of the chest, abdomen, and pelvis on May 28, 2023, identified a 1 cm irregular right upper lobe nodule. A PET scan on August 16, 2023, showed no mediastinal or hilar lymphadenopathy and a stable 1 cm right apical nodule without hypermetabolism. There was adjacent speculation and pleural thickening, diffuse central lobular emphysematous change, basal atelectasis bilaterally, and scattered vascular calcification. A repeat CT chest on November 12, 2023, showed the right apical nodule remained unchanged, with new tiny nodules in the left lower lobe and lingula.  He experiences chronic back pain that worsens with exertion, causing him to stop and rest. He describes the pain as severe enough to make him feel like he might fall if he does not sit down. He questions whether his breathing difficulties are  related to his back pain, noting that the pain improves with rest.    Results Radiology CT angiography chest, abdomen, and pelvis (05/28/2023): 1 cm irregular right upper lobe nodule PET scan chest (08/16/2023): No mediastinal or hilar lymphadenopathy; stable 1 cm right apical nodule without hypermetabolism; adjacent spiculation and pleural thickening; diffuse centrilobular emphysematous change; basal atelectasis bilaterally, greater on right; scattered vascular calcification CT chest (11/12/2023): 1 cm right apical nodule unchanged; 2 mm peripheral nodule in lateral left lower lobe and 2 mm nodule in lingula not previously seen    Review of Systems As per HPI  Past Medical History:  Diagnosis Date   Arthritis    GERD (gastroesophageal reflux disease)    Headache    migraines    IBS (irritable bowel syndrome)     Family History  Problem Relation Age of Onset   Diabetes Mother    Osteoarthritis Mother    Heart disease Father      Social History   Socioeconomic History   Marital status: Significant Other    Spouse name: Beth   Number of children: 2   Years of education: Not on file   Highest education level: GED or equivalent  Occupational History   Not on file  Tobacco Use   Smoking status: Former    Types: Cigarettes    Passive exposure: Current   Smokeless tobacco: Former   Tobacco comments:    Quit smoking 05/28/2023.   Vaping Use   Vaping status: Never Used  Substance and Sexual Activity   Alcohol use:  No    Alcohol/week: 0.0 standard drinks of alcohol    Comment: formerly a heavy drinker, quit 2002   Drug use: No   Sexual activity: Not on file  Other Topics Concern   Not on file  Social History Narrative   Not on file   Social Drivers of Health   Tobacco Use: Medium Risk (01/29/2024)   Patient History    Smoking Tobacco Use: Former    Smokeless Tobacco Use: Former    Passive Exposure: Dispensing Optician: Low Risk (06/05/2023)    Overall Financial Resource Strain (CARDIA)    Difficulty of Paying Living Expenses: Not very hard  Food Insecurity: Low Risk (11/12/2023)   Received from [facility]   Epic    Within the past 12 months, you worried that your food would run out before you got money to buy more: Never true    Within the past 12 months, the food you bought just didn't last and you didn't have money to get more. : Never true  Transportation Needs: No Transportation Needs (11/12/2023)   Received from [facility]    In the past 12 months, has lack of reliable transportation kept you from medical appointments, meetings, work or from getting things needed for daily living? : No  Physical Activity: Sufficiently Active (03/20/2023)   Received from [facility]   Exercise Vital Sign    On average, how many days per week do you engage in moderate to strenuous exercise (like a brisk walk)?: 7 days    On average, how many minutes do you engage in exercise at this level?: 30 min  Stress: No Stress Concern Present (03/20/2023)   Received from [facility] - Occupational Stress Questionnaire    Feeling of Stress : Only a little  Social Connections: Moderately Integrated (03/20/2023)   Received from [facility]   Social Connection and Isolation Panel    In a typical week, how many times do you talk on the phone with family, friends, or neighbors?: More than three times a week    How often do you get together with friends or relatives?: Never    How often do you attend church or religious services?: More than 4 times per year    Do you belong to any clubs or organizations such as church groups, unions, fraternal or athletic groups, or school groups?: No    How often do you attend meetings of the clubs or organizations you belong to?: Never    Are you married, widowed, divorced, separated, never married, or living with a partner?: Living with partner   Intimate Partner Violence: Not At Risk (05/28/2023)   Humiliation, Afraid, Rape, and Kick questionnaire    Fear of Current or Ex-Partner: No    Emotionally Abused: No    Physically Abused: No    Sexually Abused: No  Depression (PHQ2-9): Low Risk (10/18/2023)   Depression (PHQ2-9)    PHQ-2 Score: 2  Alcohol Screen: Low Risk (06/05/2023)   Alcohol Screen    Last Alcohol Screening Score (AUDIT): 0  Housing: Low Risk (11/12/2023)   Received from [facility]   Epic    What is your living situation today?: I have a steady place to live    Think about the place you live. Do you have problems with any of the following? Choose all that apply:: None/None on this list  Utilities: Low Risk (11/12/2023)  Received from [facility]   Utilities    In the past 12 months has the electric, gas, oil, or water company threatened to shut off services in your home? : No  Health Literacy: Low Risk (03/20/2023)   Received from [facility]    How often do you need to have someone help you when you read instructions, pamphlets, or other written material from your doctor or pharmacy?: Never    Allergies[1]  Medications Ordered Prior to Encounter[2]     Objective:    Vitals:   01/29/24 1543  BP: 138/82  Pulse: 83  SpO2: 93%  Weight: 185 lb (83.9 kg)  Height: 5' 8 (1.727 m)   Physical Exam Gen: Pleasant, well-nourished, in no distress,  normal affect  ENT: No lesions,  mouth clear,  oropharynx clear, no postnasal drip  Neck: No JVD, no stridor  Lungs: No use of accessory muscles, no crackles or wheezing on normal respiration, no wheeze on forced expiration  Cardiovascular: RRR, heart sounds normal, no murmur or gallops, no peripheral edema  Musculoskeletal: No deformities, no cyanosis or clubbing  Neuro: alert, awake, non focal  Skin: Warm, no lesions or rashes       Assessment & Plan:   Assessment & Plan   Assessment and Plan Assessment &  Plan Pulmonary nodule 1 cm irregular right upper lobe nodule stable on CT, no hypermetabolism on PET, suggesting benign process. Additional small nodules in left lower lobe and lingula noted. - Ordered repeat CT chest in October 2026 to monitor nodule stability. - Coordinated with Dr. Kerrin for imaging follow-up.  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 Symptoms include exertional dyspnea. Breo discontinued due to insurance issues. No asthma history.  He is willing to start alternative BD to see if gets benefit - Ordered pulmonary function tests to assess COPD severity. - Initiated trial of Stiolto 2 puffs once daily - Will review pulmonary function test results  - Will coordinate with insurance for inhaler coverage.   No follow-ups on file.  I personally spent a total of 61 minutes in the care of the patient today including preparing to see the patient, getting/reviewing separately obtained history, performing a medically appropriate exam/evaluation, counseling and educating, placing orders, documenting clinical information in the EHR, independently interpreting results, and communicating results.    Lamar Chris, MD, PhD 01/29/2024, 4:25 PM [facility] [phone] or if no answer before 7:00PM call [phone] For any issues after 7:00PM please call eLink [phone]     [1]  Allergies Allergen Reactions   Penicillins Rash    PCN: Immediate rash, facial/tongue/throat swelling, SOB or lightheadedness with hypotension.   (He has received Ceftriaxone  IM on 06/10/2017 without adverse reaction /none reported).   [2]  Current Outpatient Medications on File Prior to Visit  Medication Sig Dispense Refill   albuterol (VENTOLIN HFA) 108 (90 Base) MCG/ACT inhaler Inhale 2 puffs into the lungs every 6 (six) hours as needed.     aspirin  EC 81 MG tablet Take 1 tablet (81 mg total) by  mouth daily. Swallow whole.     clopidogrel  (PLAVIX ) 75 MG tablet Take 1 tablet (75 mg total) by mouth daily. 30 tablet 1   dicyclomine  (BENTYL ) 10 MG capsule TAKE 1 CAPSULE BY MOUTH EVERY 12 HOURS AS NEEDED (ABDOMINAL PAIN). 180 capsule 1   diphenoxylate -atropine  (LOMOTIL ) 2.5-0.025 MG tablet TAKE 1 TABLET BY MOUTH 4 (FOUR) TIMES  DAILY AS NEEDED FOR DIARRHEA OR LOOSE STOOLS. 120 tablet 1   empagliflozin  (JARDIANCE ) 10 MG TABS tablet Take 1 tablet (10 mg total) by mouth daily. 30 tablet 1   eplerenone  (INSPRA ) 25 MG tablet Take 1 tablet (25 mg total) by mouth daily. 90 tablet 1   ezetimibe  (ZETIA ) 10 MG tablet Take 1 tablet (10 mg total) by mouth daily. 90 tablet 3   furosemide  (LASIX ) 40 MG tablet Take 1 tablet (40 mg total) by mouth daily. 30 tablet 3   gabapentin  (NEURONTIN ) 300 MG capsule Take 1 capsule (300 mg total) by mouth 2 (two) times daily. 60 capsule 0   losartan  (COZAAR ) 25 MG tablet Take 1 tablet (25 mg total) by mouth daily. 30 tablet 1   MAGNESIUM  PO Take 1 tablet by mouth at bedtime.     metoprolol  succinate (TOPROL -XL) 50 MG 24 hr tablet Take 1 tablet (50 mg total) by mouth at bedtime. Take with or immediately following a meal. 30 tablet 1   Multiple Vitamin (MULTIVITAMIN) tablet Take 1 tablet by mouth at bedtime.     ondansetron  (ZOFRAN ) 4 MG tablet TAKE 1 TABLET BY MOUTH EVERY 8 HOURS AS NEEDED FOR NAUSEA AND VOMITING 90 tablet 3   pantoprazole  (PROTONIX ) 40 MG tablet Take 40 mg by mouth 2 (two) times daily.     potassium chloride  SA (KLOR-CON  M) 20 MEQ tablet Take 1 tablet (20 mEq total) by mouth daily. 90 tablet 1   promethazine  (PHENERGAN ) 12.5 MG tablet TAKE 1 TABLET BY MOUTH EVERY 6 HOURS AS NEEDED FOR NAUSEA OR VOMITING. 30 tablet 0   sucralfate  (CARAFATE ) 1 g tablet Take 1 tablet (1 g total) by mouth 4 (four) times daily -  with meals and at bedtime. Dissolve tablet in 30ml of water, then drink slurry 120 tablet 1   traMADol  (ULTRAM ) 50 MG tablet Take 50 mg by mouth every  8 (eight) hours.     BREO ELLIPTA 100-25 MCG/ACT AEPB Inhale 1 puff into the lungs daily. (Patient not taking: Reported on 01/29/2024)     oxyCODONE  (OXY IR/ROXICODONE ) 5 MG immediate release tablet Take 1 tablet (5 mg total) by mouth every 6 (six) hours as needed for severe pain (pain score 7-10). (Patient not taking: Reported on 01/29/2024) 56 tablet 0   rosuvastatin  (CRESTOR ) 40 MG tablet Take 1 tablet (40 mg total) by mouth daily. (Patient not taking: Reported on 01/29/2024) 90 tablet 2   No current facility-administered medications on file prior to visit.   "

## 2024-01-30 ENCOUNTER — Other Ambulatory Visit (HOSPITAL_COMMUNITY): Payer: Self-pay

## 2024-01-30 NOTE — Telephone Encounter (Signed)
ATC x1.  LMTCB. 

## 2024-02-04 ENCOUNTER — Telehealth: Payer: Self-pay | Admitting: *Deleted

## 2024-02-04 ENCOUNTER — Telehealth: Payer: Self-pay

## 2024-02-04 NOTE — Telephone Encounter (Signed)
 Copied from CRM #[id number]. Topic: Clinical - Medication Question >> Feb 04, 2024 12:22 PM Dedra B wrote: Reason for CRM: Patient wife, Landry, returning call for Marcus regarding patient's Breo.    Tried to reach out to patient VM/LM return call   ( Most likely its due to a deductible that needs to be met with his insurance )

## 2024-02-04 NOTE — Telephone Encounter (Signed)
Left voice message for patient to call me back.

## 2024-02-07 NOTE — Telephone Encounter (Signed)
 Called and spoke with the pt and the line got disconnected.  If pt calls back please inform pt of the note below from Juliana.

## 2024-02-17 ENCOUNTER — Encounter: Payer: Self-pay | Admitting: Emergency Medicine

## 2024-02-20 ENCOUNTER — Encounter (INDEPENDENT_AMBULATORY_CARE_PROVIDER_SITE_OTHER): Admitting: Gastroenterology

## 2024-02-21 ENCOUNTER — Other Ambulatory Visit (HOSPITAL_COMMUNITY): Payer: Self-pay

## 2024-02-24 ENCOUNTER — Ambulatory Visit: Payer: Self-pay | Admitting: Emergency Medicine

## 2024-04-01 ENCOUNTER — Ambulatory Visit: Admitting: Emergency Medicine

## 2024-04-01 ENCOUNTER — Encounter

## 2024-04-09 ENCOUNTER — Ambulatory Visit: Admitting: Emergency Medicine
